# Patient Record
Sex: Male | Born: 1940 | Race: Black or African American | Hispanic: No | Smoking: Current some day smoker
Health system: Southern US, Community
[De-identification: ages and names within clinical notes are randomized; demographics above are authoritative.]

## PROBLEM LIST (undated history)

## (undated) DIAGNOSIS — M1990 Unspecified osteoarthritis, unspecified site: Secondary | ICD-10-CM

## (undated) DIAGNOSIS — I4891 Unspecified atrial fibrillation: Secondary | ICD-10-CM

## (undated) DIAGNOSIS — I429 Cardiomyopathy, unspecified: Secondary | ICD-10-CM

## (undated) DIAGNOSIS — I272 Pulmonary hypertension, unspecified: Secondary | ICD-10-CM

## (undated) DIAGNOSIS — R569 Unspecified convulsions: Secondary | ICD-10-CM

## (undated) DIAGNOSIS — F1020 Alcohol dependence, uncomplicated: Secondary | ICD-10-CM

## (undated) DIAGNOSIS — K5520 Angiodysplasia of colon without hemorrhage: Secondary | ICD-10-CM

## (undated) DIAGNOSIS — D72829 Elevated white blood cell count, unspecified: Secondary | ICD-10-CM

## (undated) DIAGNOSIS — R410 Disorientation, unspecified: Secondary | ICD-10-CM

## (undated) DIAGNOSIS — J449 Chronic obstructive pulmonary disease, unspecified: Secondary | ICD-10-CM

## (undated) DIAGNOSIS — F0390 Unspecified dementia without behavioral disturbance: Secondary | ICD-10-CM

## (undated) DIAGNOSIS — I739 Peripheral vascular disease, unspecified: Secondary | ICD-10-CM

## (undated) DIAGNOSIS — I96 Gangrene, not elsewhere classified: Secondary | ICD-10-CM

## (undated) DIAGNOSIS — F17200 Nicotine dependence, unspecified, uncomplicated: Secondary | ICD-10-CM

## (undated) DIAGNOSIS — N179 Acute kidney failure, unspecified: Secondary | ICD-10-CM

## (undated) DIAGNOSIS — I10 Essential (primary) hypertension: Secondary | ICD-10-CM

## (undated) HISTORY — DX: Angiodysplasia of colon without hemorrhage: K55.20

## (undated) HISTORY — DX: Nicotine dependence, unspecified, uncomplicated: F17.200

## (undated) HISTORY — DX: Unspecified dementia, unspecified severity, without behavioral disturbance, psychotic disturbance, mood disturbance, and anxiety: F03.90

## (undated) HISTORY — DX: Alcohol dependence, uncomplicated: F10.20

---

## 1997-11-28 ENCOUNTER — Emergency Department (HOSPITAL_COMMUNITY): Admission: EM | Admit: 1997-11-28 | Discharge: 1997-11-28 | Payer: Self-pay | Admitting: Emergency Medicine

## 1998-01-02 ENCOUNTER — Emergency Department (HOSPITAL_COMMUNITY): Admission: EM | Admit: 1998-01-02 | Discharge: 1998-01-02 | Payer: Self-pay | Admitting: Emergency Medicine

## 1998-01-02 ENCOUNTER — Encounter: Payer: Self-pay | Admitting: Emergency Medicine

## 1998-01-03 ENCOUNTER — Emergency Department (HOSPITAL_COMMUNITY): Admission: EM | Admit: 1998-01-03 | Discharge: 1998-01-03 | Payer: Self-pay | Admitting: Emergency Medicine

## 1998-09-14 ENCOUNTER — Emergency Department (HOSPITAL_COMMUNITY): Admission: EM | Admit: 1998-09-14 | Discharge: 1998-09-14 | Payer: Self-pay | Admitting: Emergency Medicine

## 1998-09-27 ENCOUNTER — Emergency Department (HOSPITAL_COMMUNITY): Admission: EM | Admit: 1998-09-27 | Discharge: 1998-09-27 | Payer: Self-pay | Admitting: Emergency Medicine

## 1998-09-27 ENCOUNTER — Encounter: Payer: Self-pay | Admitting: Emergency Medicine

## 1998-11-23 ENCOUNTER — Emergency Department (HOSPITAL_COMMUNITY): Admission: EM | Admit: 1998-11-23 | Discharge: 1998-11-24 | Payer: Self-pay | Admitting: Emergency Medicine

## 1998-11-23 ENCOUNTER — Encounter: Payer: Self-pay | Admitting: Emergency Medicine

## 1999-03-06 ENCOUNTER — Emergency Department (HOSPITAL_COMMUNITY): Admission: EM | Admit: 1999-03-06 | Discharge: 1999-03-06 | Payer: Self-pay | Admitting: Emergency Medicine

## 1999-03-15 ENCOUNTER — Emergency Department (HOSPITAL_COMMUNITY): Admission: EM | Admit: 1999-03-15 | Discharge: 1999-03-15 | Payer: Self-pay | Admitting: Emergency Medicine

## 1999-03-21 ENCOUNTER — Emergency Department (HOSPITAL_COMMUNITY): Admission: EM | Admit: 1999-03-21 | Discharge: 1999-03-21 | Payer: Self-pay | Admitting: Emergency Medicine

## 1999-06-18 ENCOUNTER — Encounter: Payer: Self-pay | Admitting: Emergency Medicine

## 1999-06-18 ENCOUNTER — Encounter: Payer: Self-pay | Admitting: *Deleted

## 1999-06-18 ENCOUNTER — Emergency Department (HOSPITAL_COMMUNITY): Admission: EM | Admit: 1999-06-18 | Discharge: 1999-06-18 | Payer: Self-pay | Admitting: Emergency Medicine

## 2000-10-06 ENCOUNTER — Encounter: Payer: Self-pay | Admitting: Emergency Medicine

## 2000-10-06 ENCOUNTER — Emergency Department (HOSPITAL_COMMUNITY): Admission: EM | Admit: 2000-10-06 | Discharge: 2000-10-06 | Payer: Self-pay | Admitting: Emergency Medicine

## 2002-01-11 ENCOUNTER — Encounter: Payer: Self-pay | Admitting: Emergency Medicine

## 2002-01-11 ENCOUNTER — Emergency Department (HOSPITAL_COMMUNITY): Admission: EM | Admit: 2002-01-11 | Discharge: 2002-01-11 | Payer: Self-pay | Admitting: Emergency Medicine

## 2003-02-11 ENCOUNTER — Observation Stay (HOSPITAL_COMMUNITY): Admission: EM | Admit: 2003-02-11 | Discharge: 2003-02-12 | Payer: Self-pay | Admitting: Emergency Medicine

## 2003-02-11 ENCOUNTER — Emergency Department (HOSPITAL_COMMUNITY): Admission: EM | Admit: 2003-02-11 | Discharge: 2003-02-11 | Payer: Self-pay | Admitting: Emergency Medicine

## 2004-02-03 ENCOUNTER — Ambulatory Visit: Payer: Self-pay | Admitting: Internal Medicine

## 2004-09-22 ENCOUNTER — Ambulatory Visit: Payer: Self-pay | Admitting: Internal Medicine

## 2004-09-26 ENCOUNTER — Ambulatory Visit (HOSPITAL_COMMUNITY): Admission: RE | Admit: 2004-09-26 | Discharge: 2004-09-26 | Payer: Self-pay | Admitting: Internal Medicine

## 2004-10-03 ENCOUNTER — Ambulatory Visit: Payer: Self-pay | Admitting: Internal Medicine

## 2005-07-07 ENCOUNTER — Emergency Department (HOSPITAL_COMMUNITY): Admission: EM | Admit: 2005-07-07 | Discharge: 2005-07-07 | Payer: Self-pay | Admitting: Emergency Medicine

## 2005-07-20 ENCOUNTER — Emergency Department (HOSPITAL_COMMUNITY): Admission: EM | Admit: 2005-07-20 | Discharge: 2005-07-20 | Payer: Self-pay | Admitting: Emergency Medicine

## 2005-07-25 ENCOUNTER — Ambulatory Visit: Payer: Self-pay | Admitting: Internal Medicine

## 2005-08-16 ENCOUNTER — Encounter: Payer: Self-pay | Admitting: Emergency Medicine

## 2006-04-11 ENCOUNTER — Ambulatory Visit: Payer: Self-pay | Admitting: *Deleted

## 2006-05-23 ENCOUNTER — Ambulatory Visit: Payer: Self-pay | Admitting: Internal Medicine

## 2007-01-02 ENCOUNTER — Encounter (INDEPENDENT_AMBULATORY_CARE_PROVIDER_SITE_OTHER): Payer: Self-pay | Admitting: *Deleted

## 2007-10-24 ENCOUNTER — Emergency Department (HOSPITAL_COMMUNITY): Admission: EM | Admit: 2007-10-24 | Discharge: 2007-10-25 | Payer: Self-pay | Admitting: Emergency Medicine

## 2008-05-18 ENCOUNTER — Encounter: Payer: Self-pay | Admitting: Internal Medicine

## 2008-06-02 ENCOUNTER — Ambulatory Visit: Payer: Self-pay | Admitting: Gastroenterology

## 2008-06-16 ENCOUNTER — Telehealth: Payer: Self-pay | Admitting: Gastroenterology

## 2008-06-16 ENCOUNTER — Ambulatory Visit: Payer: Self-pay | Admitting: Gastroenterology

## 2008-06-18 ENCOUNTER — Encounter (INDEPENDENT_AMBULATORY_CARE_PROVIDER_SITE_OTHER): Payer: Self-pay | Admitting: Gastroenterology

## 2008-06-18 ENCOUNTER — Ambulatory Visit: Payer: Self-pay | Admitting: Gastroenterology

## 2008-06-18 ENCOUNTER — Ambulatory Visit (HOSPITAL_COMMUNITY): Admission: RE | Admit: 2008-06-18 | Discharge: 2008-06-18 | Payer: Self-pay | Admitting: Gastroenterology

## 2008-06-25 ENCOUNTER — Encounter: Payer: Self-pay | Admitting: Gastroenterology

## 2009-05-06 ENCOUNTER — Encounter (INDEPENDENT_AMBULATORY_CARE_PROVIDER_SITE_OTHER): Payer: Self-pay | Admitting: *Deleted

## 2009-06-09 ENCOUNTER — Encounter (INDEPENDENT_AMBULATORY_CARE_PROVIDER_SITE_OTHER): Payer: Self-pay

## 2009-06-11 ENCOUNTER — Encounter (INDEPENDENT_AMBULATORY_CARE_PROVIDER_SITE_OTHER): Payer: Self-pay | Admitting: *Deleted

## 2009-06-30 ENCOUNTER — Encounter (INDEPENDENT_AMBULATORY_CARE_PROVIDER_SITE_OTHER): Payer: Self-pay | Admitting: *Deleted

## 2009-07-01 ENCOUNTER — Ambulatory Visit: Payer: Self-pay | Admitting: Gastroenterology

## 2009-07-05 ENCOUNTER — Telehealth: Payer: Self-pay | Admitting: Gastroenterology

## 2009-07-06 ENCOUNTER — Encounter (INDEPENDENT_AMBULATORY_CARE_PROVIDER_SITE_OTHER): Payer: Self-pay | Admitting: *Deleted

## 2009-07-06 ENCOUNTER — Telehealth: Payer: Self-pay | Admitting: Gastroenterology

## 2009-07-06 DIAGNOSIS — D126 Benign neoplasm of colon, unspecified: Secondary | ICD-10-CM

## 2009-07-14 ENCOUNTER — Ambulatory Visit (HOSPITAL_COMMUNITY): Admission: RE | Admit: 2009-07-14 | Discharge: 2009-07-14 | Payer: Self-pay | Admitting: Gastroenterology

## 2009-07-14 ENCOUNTER — Ambulatory Visit: Payer: Self-pay | Admitting: Gastroenterology

## 2010-05-17 NOTE — Progress Notes (Signed)
Summary: Colonoscopy/ERBE Scheduled   Phone Note Outgoing Call   Call placed by: Laureen Ochs LPN,  July 06, 2009 1:25 PM Call placed to: Mrs.Sherwin Summary of Call: Pt. needs Colonoscopy/ERBE, it is scheduled at [facility] on 07-14-09 at 10am. I have updated instructions with pt. wife and pt. will come to get an updated copy and I will give him a MoviPrep, he cannot afford to purchase it. Pt. instructed to call back as needed.  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

## 2010-05-17 NOTE — Letter (Signed)
Summary: Pre Visit No Show Letter  [facility]  [address] [place], [place] [address]   Phone: [phone]  Fax: [phone]        June 09, 2009 MRN: [id number]    Leon Foster [address] [place], [place]  [address]    Dear Mr. MERLO,   We have been unable to reach you by phone concerning the pre-procedure visit that you missed on 06/09/09. For this reason,your procedure scheduled on 06/18/09 has been cancelled. Our scheduling staff will gladly assist you with rescheduling your appointments at a more convenient time. Please call our office at [phone] between the hours of 8:00am and 5:00pm, press option #2 to reach an appointment scheduler. Please consider updating your contact numbers at this time so that we can reach you by phone in the future with schedule changes or results.    Thank you,    Ulis Rias RN [facility]

## 2010-05-17 NOTE — Letter (Signed)
Summary: Previsit letter  [facility]  [address] [place], [place] [address]   Phone: [phone]  Fax: [phone]       05/06/2009 MRN: [id number]  [facility] [address] [place], [place]  [address]  Dear Leon Foster,  Welcome to the Gastroenterology Division at [facility].    You are scheduled to see a nurse for your pre-procedure visit on 06-04-09 at 8:30a.m. on the 3rd floor at [facility], [address]  We ask that you try to arrive at our office 15 minutes prior to your appointment time to allow for check-in.  Your nurse visit will consist of discussing your medical and surgical history, your immediate family medical history, and your medications.    Please bring a complete list of all your medications or, if you prefer, bring the medication bottles and we will list them.  We will need to be aware of both prescribed and over the counter drugs.  We will need to know exact dosage information as well.  If you are on blood thinners (Coumadin, Plavix, Aggrenox, Ticlid, etc.) please call our office today/prior to your appointment, as we need to consult with your physician about holding your medication.   Please be prepared to read and sign documents such as consent forms, a financial agreement, and acknowledgement forms.  If necessary, and with your consent, a friend or relative is welcome to sit-in on the nurse visit with you.  Please bring your insurance card so that we may make a copy of it.  If your insurance requires a referral to see a specialist, please bring your referral form from your primary care physician.  No co-pay is required for this nurse visit.     If you cannot keep your appointment, please call [phone] to cancel or reschedule prior to your appointment date.  This allows [place] the opportunity to schedule an appointment for another patient in need of care.    Thank you for choosing [facility] for your medical  needs.  We appreciate the opportunity to care for you.  Please visit [place] at our website  to learn more about our practice.                     Sincerely.                                                                                                                   The Gastroenterology Division

## 2010-05-17 NOTE — Procedures (Signed)
Summary: Colonoscopy  Patient: Leon Foster Note: All result statuses are Final unless otherwise noted.  Tests: (1) Colonoscopy (COL)   COL Colonoscopy           DONE     [facility]     [address] [place], [place]  [address]           COLONOSCOPY PROCEDURE REPORT           PATIENT:  [name], [name]  MR#:  [id number]     BIRTHDATE:  12/11/1940, 68 yrs. old  GENDER:  male     ENDOSCOPIST:  Barbette Hair. Arlyce Dice, MD     REF. BY:     PROCEDURE DATE:  07/14/2009     PROCEDURE:  Colonoscopy with  ablation of AVMs     ASA CLASS:  Class II     INDICATIONS:  history of pre-cancerous (adenomatous) colon polyps           MEDICATIONS:   Fentanyl 50 mcg IV, Versed 10 mg IV, Benadryl 50 mg     IV           DESCRIPTION OF PROCEDURE:   After the risks benefits and     alternatives of the procedure were thoroughly explained, informed     consent was obtained.  Digital rectal exam was performed and     revealed no abnormalities.   The EC-[id number] ([id number]) endoscope was     introduced through the anus and advanced to the cecum, which was     identified by both the appendix and ileocecal valve, without     limitations.  The quality of the prep was adequate, using     MoviPrep.  The instrument was then slowly withdrawn as the colon     was fully examined.     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 Previously described AVMs, 2mm each, in     cecum - obliterated with the APC  Diverticula were found in the     ascending colon (see image006 and image001).  This was otherwise a     normal examination of the colon (see image002, image007, image008,     and image010).  Mild diverticulosis was found in the sigmoid     colon.   Retroflexed views in the rectum revealed no     abnormalities.    The scope was then withdrawn from the patient     and the procedure completed.           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           ______________________________     Barbette Hair. Arlyce Dice, MD           CC:  Kellie Shropshire, MD           n.     Rosalie Doctor:   Barbette Hair. Kemi Gell at 07/14/2009 10:29 AM           Darcey Nora, [id number]  Note: An exclamation mark (!) indicates a result that was not dispersed into the flowsheet. Document Creation Date: 07/14/2009 10:30 AM _______________________________________________________________________  (1) Order result status: Final Collection or observation date-time: 07/14/2009 10:19 Requested date-time:  Receipt date-time:  Reported date-time:  Referring Physician:   Ordering Physician: Melvia Heaps [phone]) Specimen Source:  Source: Launa Grill Order Number: [id number] Lab site:   Appended Document: Colonoscopy Recall is in IDX for 06/2012.

## 2010-05-17 NOTE — Letter (Signed)
Summary: Leon Foster Instructions  Leon Foster  [address] [place], [place] [address]   Phone: [phone]  Fax: [phone]       Leon Foster    Oct 08, 1940    MRN: [id number]        Procedure Day Dorna Bloom:  Lenor Coffin  07/15/09     Arrival Time:  8:00AM     Procedure Time:  9:00AM     Location of Procedure:                    [facility] _  [facility] (4th Floor)                        PREPARATION FOR COLONOSCOPY WITH MOVIPREP   Starting 5 days prior to your procedure 07/10/09 do not eat nuts, seeds, popcorn, corn, beans, peas,  salads, or any raw vegetables.  Do not take any fiber supplements (e.g. Metamucil, Citrucel, and Benefiber).  THE DAY BEFORE YOUR PROCEDURE         DATE: 07/14/09  DAY: WEDNESDAY  1.  Drink clear liquids the entire day-NO SOLID FOOD  2.  Do not drink anything colored red or purple.  Avoid juices with pulp.  No orange juice.  3.  Drink at least 64 oz. (8 glasses) of fluid/clear liquids during the day to prevent dehydration and help the prep work efficiently.  CLEAR LIQUIDS INCLUDE: Water Jello Ice Popsicles Tea (sugar ok, no milk/cream) Powdered fruit flavored drinks Coffee (sugar ok, no milk/cream) Gatorade Juice: apple, white grape, white cranberry  Lemonade Clear bullion, consomm, broth Carbonated beverages (any kind) Strained chicken noodle soup Hard Candy                             4.  In the morning, mix first dose of MoviPrep solution:    Empty 1 Pouch A and 1 Pouch B into the disposable container    Add lukewarm drinking water to the top line of the container. Mix to dissolve    Refrigerate (mixed solution should be used within 24 hrs)  5.  Begin drinking the prep at 5:00 p.m. The MoviPrep container is divided by 4 marks.   Every 15 minutes drink the solution down to the next mark (approximately 8 oz) until the full liter is complete.   6.  Follow completed prep with 16 oz of clear liquid of your choice  (Nothing red or purple).  Continue to drink clear liquids until bedtime.  7.  Before going to bed, mix second dose of MoviPrep solution:    Empty 1 Pouch A and 1 Pouch B into the disposable container    Add lukewarm drinking water to the top line of the container. Mix to dissolve    Refrigerate  THE DAY OF YOUR PROCEDURE      DATE: 07/15/09  DAY: THURSDAY  Beginning at 4:00a.m. (5 hours before procedure):         1. Every 15 minutes, drink the solution down to the next mark (approx 8 oz) until the full liter is complete.  2. Follow completed prep with 16 oz. of clear liquid of your choice.    3. You may drink clear liquids until 7:00AM (2 HOURS BEFORE PROCEDURE).   MEDICATION INSTRUCTIONS  Unless otherwise instructed, you should take regular prescription medications with a small sip of water   as early as possible the morning  of your procedure.        OTHER INSTRUCTIONS  You will need a responsible adult at least 69 years of age to accompany you and drive you home.   This person must remain in the waiting room during your procedure.  Wear loose fitting clothing that is easily removed.  Leave jewelry and other valuables at home.  However, you may wish to bring a book to read or  an iPod/MP3 player to listen to music as you wait for your procedure to start.  Remove all body piercing jewelry and leave at home.  Total time from sign-in until discharge is approximately 2-3 hours.  You should go home directly after your procedure and rest.  You can resume normal activities the  day after your procedure.  The day of your procedure you should not:   Drive   Make legal decisions   Operate machinery   Drink alcohol   Return to work  You will receive specific instructions about eating, activities and medications before you leave.    The above instructions have been reviewed and explained to me by  Karl Bales RN  July 01, 2009 2:36 PM    I fully  understand and can verbalize these instructions _____________________________ Date _________

## 2010-05-17 NOTE — Letter (Signed)
Summary: [facility] Instructions  [facility]  [address] [place], [place] [address]   Phone: [phone]  Fax: [phone]       Leon Foster    69/20/1942    MRN: [id number]        Procedure Day Dorna Bloom:  [facility] MARCH 30TH, 2011     Arrival Time: 9AM     Procedure Time: 10AM     Location of Procedure:                    [facility] ( Outpatient Registration)                       PREPARATION FOR COLONOSCOPY WITH MOVIPREP   Starting 5 days prior to your procedure: not eat nuts, seeds, popcorn, corn, beans, peas,  salads, or any raw vegetables.  Do not take any fiber supplements (e.g. Metamucil, Citrucel, and Benefiber).  THE DAY BEFORE YOUR PROCEDURE: TUESDAY MARCH 52WU,1324  1.  Drink clear liquids the entire day-NO SOLID FOOD  2.  Do not drink anything colored red or purple.  Avoid juices with pulp.  No orange juice.  3.  Drink at least 64 oz. (8 glasses) of fluid/clear liquids during the day to prevent dehydration and help the prep work efficiently.  CLEAR LIQUIDS INCLUDE: Water Jello Ice Popsicles Tea (sugar ok, no milk/cream) Powdered fruit flavored drinks Coffee (sugar ok, no milk/cream) Gatorade Juice: apple, white grape, white cranberry  Lemonade Clear bullion, consomm, broth Carbonated beverages (any kind) Strained chicken noodle soup Hard Candy                             4.  In the morning, mix first dose of MoviPrep solution:    Empty 1 Pouch A and 1 Pouch B into the disposable container    Add lukewarm drinking water to the top line of the container. Mix to dissolve    Refrigerate (mixed solution should be used within 24 hrs)  5.  Begin drinking the prep at 5:00 p.m. The MoviPrep container is divided by 4 marks.   Every 15 minutes drink the solution down to the next mark (approximately 8 oz) until the full liter is complete.   6.  Follow completed prep with 16 oz of clear liquid of your choice (Nothing red or  purple).  Continue to drink clear liquids until bedtime.  7.  Before going to bed, mix second dose of MoviPrep solution:    Empty 1 Pouch A and 1 Pouch B into the disposable container    Add lukewarm drinking water to the top line of the container. Mix to dissolve    Refrigerate  THE DAY OF YOUR PROCEDURE: [facility] MARCH 40NU,2725  Beginning at 5AM(5 hours before procedure):         1. Every 15 minutes, drink the solution down to the next mark (approx 8 oz) until the full liter is complete.  2. Follow completed prep with 16 oz. of clear liquid of your choice.    3. You may drink clear liquids until 8AM. 2 HOURS BEFORE YOUR PROCEDURE.   MEDICATION INSTRUCTIONS  Unless otherwise instructed, you should take regular prescription medications with a small sip of water   as early as possible the morning of your procedure.           OTHER INSTRUCTIONS  You will need a responsible adult at 69 years of age to accompany you and drive you home.   This person must remain in the waiting room during your procedure.  Wear loose fitting clothing that is easily removed.  Leave jewelry and other valuables at home.  However, you may wish to bring a book to read or  an iPod/MP3 player to listen to music as you wait for your procedure to start.  Remove all body piercing jewelry and leave at home.  Total time from sign-in until discharge is approximately 2-3 hours.  You should go home directly after your procedure and rest.  You can resume normal activities the  day after your procedure.  The day of your procedure you should not:   Drive   Make legal decisions   Operate machinery   Drink alcohol   Return to work  You will receive specific instructions about eating, activities and medications before you leave.    The above instructions have been reviewed and explained to Leon Foster by phone and Leon Foster will pick-up on 07-07-09. Laureen Ochs LPN  July 06, 2009 1:35  PM

## 2010-05-17 NOTE — Progress Notes (Signed)
Summary: Moviprep too expensive   Phone Note Call from Patient Call back at Home Phone [phone]   Caller: spouse  Kathie Rhodes Call For: Dr. Arlyce Dice Reason for Call: Talk to Nurse Summary of Call: Pt. ins. will not cover Moviprep and needs something less expensive prescribed Initial call taken by: Karna Christmas,  July 05, 2009 10:14 AM  Follow-up for Phone Call        No ID on answering machine Follow-up by: Wyona Almas RN,  July 05, 2009 10:55 AM  Additional Follow-up for Phone Call Additional follow up Details #1::        No answer Additional Follow-up by: Wyona Almas RN,  July 05, 2009 1:48 PM    Additional Follow-up for Phone Call Additional follow up Details #2::    you can use miralax 254gm in 64oz gatorade - pt should take a split dose, 1/2 the eve before and the remainder 4 hours before the procedure  Follow-up by: Louis Meckel MD,  July 05, 2009 2:08 PM  Additional Follow-up for Phone Call Additional follow up Details #3:: Details for Additional Follow-up Action Taken: Spoke with pts. wife and she said his [place] insurance dropped him and he doesn't have money for any prep, not even the Miralax.  Advised she and her husband look into a plan that has D coverage as he'll need it and it's required he have it.  Also, Pt. had his last colon at [facility] due to an incomplete one at [facility].  Do you want him to be scheduled at [facility] ?  Alvino Chapel Can be done with ERBE at [facility].  He had an incomplete exam b/c of a poor prep.  We can do a conventional prep of 2 days of clear liquids, Mg citrate days 1 and 2, fleet's enema on the eve prior to procedure and on the day of procedure Additional Follow-up by: Wyona Almas RN,  July 05, 2009 5:57 PM

## 2010-05-17 NOTE — Letter (Signed)
Summary: Previsit letter  [facility]  [address] [place], [place] [address]   Phone: [phone]  Fax: [phone]       06/11/2009 MRN: [id number]  [facility] [address] [place], [place]  [address]  Dear Mr. MCELRATH,  Welcome to the Gastroenterology Division at [facility].    You are scheduled to see a nurse for your pre-procedure visit on 07-01-09 at 2:00p.m. on the 3rd floor at [facility], [address].  We ask that you try to arrive at our office 15 minutes prior to your appointment time to allow for check-in.  Your nurse visit will consist of discussing your medical and surgical history, your immediate family medical history, and your medications.    Please bring a complete list of all your medications or, if you prefer, bring the medication bottles and we will list them.  We will need to be aware of both prescribed and over the counter drugs.  We will need to know exact dosage information as well.  If you are on blood thinners (Coumadin, Plavix, Aggrenox, Ticlid, etc.) please call our office today/prior to your appointment, as we need to consult with your physician about holding your medication.   Please be prepared to read and sign documents such as consent forms, a financial agreement, and acknowledgement forms.  If necessary, and with your consent, a friend or relative is welcome to sit-in on the nurse visit with you.  Please bring your insurance card so that we may make a copy of it.  If your insurance requires a referral to see a specialist, please bring your referral form from your primary care physician.  No co-pay is required for this nurse visit.     If you cannot keep your appointment, please call [phone] to cancel or reschedule prior to your appointment date.  This allows [place] the opportunity to schedule an appointment for another patient in need of care.    Thank you for choosing [facility] for your medical  needs.  We appreciate the opportunity to care for you.  Please visit [place] at our website  to learn more about our practice.                     Sincerely.                                                                                                                   The Gastroenterology Division

## 2010-05-17 NOTE — Miscellaneous (Signed)
Summary: [facility] previsit  Clinical Lists Changes  Medications: Added new medication of MOVIPREP 100 GM  SOLR (PEG-KCL-NACL-NASULF-NA ASC-C) As per prep instructions. - Signed Rx of MOVIPREP 100 GM  SOLR (PEG-KCL-NACL-NASULF-NA ASC-C) As per prep instructions.;  #1 x 0;  Signed;  Entered by: Karl Bales RN;  Authorized by: Louis Meckel MD;  Method used: [facility] to [facility] [address] [phone]*, [address], [place], [place], [place]  [id number], Ph: [phone] or [phone], Fax: [phone] Observations: Added new observation of NKA: T (07/01/2009 14:12)    Prescriptions: MOVIPREP 100 GM  SOLR (PEG-KCL-NACL-NASULF-NA ASC-C) As per prep instructions.  #1 x 0   Entered by:   Karl Bales RN   Authorized by:   Louis Meckel MD   Signed by:   Karl Bales RN on 07/01/2009   Method used:   [facility]  [address] [phone]* (retail)       [address]       [place], [place]  [id number]       Ph: [phone] or [phone]       Fax: [phone]   RxID:   [id number]

## 2010-09-02 NOTE — H&P (Signed)
NAME:  Leon Foster, Leon Foster NO.:  [id number]   MEDICAL RECORD NO.:  [id number]                   PATIENT TYPE:  INP   LOCATION:  1825                                 FACILITY:  [facility]   PHYSICIAN:  Hollice Espy, M.D.            DATE OF BIRTH:  1940/06/24   DATE OF ADMISSION:  02/11/2003  DATE OF DISCHARGE:                                HISTORY & PHYSICAL   ATTENDING PHYSICIAN:  Hollice Espy, M.D.   PRIMARY CARE PHYSICIAN:  I believe, is unassigned.   CHIEF COMPLAINT:  Chest pain.   This is a 70 year old African American male, with a past medical history of  COPD, suspected alcohol abuse, who presents with chest pain.  The patient  states he has been previously well with no complaints.  He started having  some sharp 10/10 right sided chest pain close to the midsternal area and  came into the ER early this morning on February 11, 2003.  He was seen by the  ER doctor who felt that it was musculoskeletal especially with the fact that  it was reproducible.  He discharged the patient home and the patient went to  the voting booth to go vote today and started having another onset of severe  chest pain 10/10, even more severe than previously forcing him to sit down.  He was brought in and at that time a CT of the chest was done to rule out PE  which was negative and showed no acute disease, only signs consistent with  COPD.  His EKG was normal sinus rhythm with some slight left atrial  abnormality.  The initial set of cardiac enzymes was negative.  Currently,  the patient states he is not having any current chest discomfort.  He is  otherwise doing well.  He denies any headaches, visual changes, and no  current shortness of breath, abdominal pain, extremity pain, weakness,  hematuria, dysuria, constipation, diarrhea.  He describes his chest pain as  sharp 10/10, located in the mid sternal to slightly right of that area.  No  other radiation.   Positive shortness of breath, positive diaphoresis.  The  patient could not quantify the length of time.   PAST MEDICAL HISTORY:  1. COPD.  2. He also has suspected alcohol abuse.  3. He has a history of an appendix removal.   MEDICATIONS:  None.   ALLERGIES:  None.   SOCIAL HISTORY:  The patient is a greater than 25 pack year smoker.  He  states that he does not drink beer every day, but when he does he usually  drinks a few.  He denies any drug use.   FAMILY HISTORY:  Noncontributory.   PHYSICAL EXAMINATION:  VITAL SIGNS:  On admission, temp 98.1, pulse 94,  blood pressure 89/61 currently 100/60, respirations 14, O2 sat 100% on room  air.  GENERAL:  This is a 70 year old African  American male who looks slightly  younger than his stated age.  He appears to be in no apparent distress,  alert and oriented x 3.  HEENT:  Normocephalic, atraumatic.  He has slightly icteric sclera.  No  carotid bruits.  Mucous membranes are moist.  CARDIOVASCULAR:  Regular, rate and rhythm, S1, S2.  LUNGS:  Clear to auscultation bilaterally.  ABDOMEN:  Soft, nontender, nondistended.  Positive bowel sounds.  EXTREMITIES:  No clubbing, cyanosis or edema.   LAB WORK:  He has a sodium 143, potassium 4.5, chloride 110, bicarb 28, BUN  7, creatinine 0.9, glucose 89.  LFTs were essentially within normal limits,  though his albumin is slightly low at 3.3.  He has a white count of 7.1 with  a 59% shift which is normal.  H&H of 12.7 and 37.9, MCV of 93.  Platelet  count is 293.  He has a serum alcohol level, from earlier this morning, of  120.  His last set of cardiac markers showed a CPK of 84.7, MB of 1.4,  troponin I of less than 0.05.  The previous set, done at 9 a.m. this  morning, were also negative.  UA is negative as well.   ASSESSMENT/PLAN:  16. A 70 year old [place] American male with a history of chronic obstructive     pulmonary disease, suspected alcohol abuse, presents with chest pain.  He      does not appear to have medical followup, and I suspect that he might be     at high risk for coronary artery disease.  We will admit him to rule out     an myocardial infarction, check two more sets of enzymes, stress test     tomorrow.  2. In regards to his chronic obstructive pulmonary disease, we will continue     him on albuterol nebs.  3. In regard to his alcohol abuse, we will put him on thiamine, folate,     check an alcohol level in the morning.  At this time, I do not feel he is     at risk for withdrawal, although we will watch him carefully as he is on     a telemetry bed.                                                Hollice Espy, M.D.    SKK/MEDQ  D:  02/11/2003  T:  02/11/2003  Job:  [id number]

## 2011-01-12 LAB — RAPID URINE DRUG SCREEN, HOSP PERFORMED
Amphetamines: NOT DETECTED
Barbiturates: NOT DETECTED
Benzodiazepines: NOT DETECTED
Cocaine: NOT DETECTED
Opiates: POSITIVE — AB
Tetrahydrocannabinol: NOT DETECTED

## 2011-01-12 LAB — POCT I-STAT, CHEM 8
BUN: 6
Calcium, Ion: 1.06 — ABNORMAL LOW
Chloride: 103
Creatinine, Ser: 1.1
Glucose, Bld: 102 — ABNORMAL HIGH
HCT: 49
Hemoglobin: 16.7
Potassium: 3.6
Sodium: 141
TCO2: 25

## 2011-01-12 LAB — ETHANOL: Alcohol, Ethyl (B): 318 — ABNORMAL HIGH

## 2012-05-15 ENCOUNTER — Encounter: Payer: Self-pay | Admitting: Gastroenterology

## 2013-02-28 ENCOUNTER — Encounter: Payer: Self-pay | Admitting: Gastroenterology

## 2013-03-29 ENCOUNTER — Emergency Department (HOSPITAL_COMMUNITY): Payer: No Typology Code available for payment source

## 2013-03-29 ENCOUNTER — Encounter (HOSPITAL_COMMUNITY): Payer: Self-pay | Admitting: Emergency Medicine

## 2013-03-29 ENCOUNTER — Emergency Department (HOSPITAL_COMMUNITY)
Admission: EM | Admit: 2013-03-29 | Discharge: 2013-03-30 | Disposition: A | Discharge status: Home / Self Care | Payer: No Typology Code available for payment source | Attending: Emergency Medicine | Admitting: Emergency Medicine

## 2013-03-29 DIAGNOSIS — Y9389 Activity, other specified: Secondary | ICD-10-CM | POA: Insufficient documentation

## 2013-03-29 DIAGNOSIS — IMO0002 Reserved for concepts with insufficient information to code with codable children: Secondary | ICD-10-CM | POA: Insufficient documentation

## 2013-03-29 DIAGNOSIS — S76911A Strain of unspecified muscles, fascia and tendons at thigh level, right thigh, initial encounter: Secondary | ICD-10-CM

## 2013-03-29 DIAGNOSIS — Y92410 Unspecified street and highway as the place of occurrence of the external cause: Secondary | ICD-10-CM | POA: Insufficient documentation

## 2013-03-29 DIAGNOSIS — F17200 Nicotine dependence, unspecified, uncomplicated: Secondary | ICD-10-CM | POA: Insufficient documentation

## 2013-03-29 MED ORDER — OXYCODONE-ACETAMINOPHEN 5-325 MG PO TABS
1.0000 | ORAL_TABLET | Freq: Once | ORAL | Status: AC
  Administered 2013-03-30: 1 via ORAL
  Filled 2013-03-29: qty 1
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
## 2013-03-29 NOTE — ED Notes (Signed)
Bed: WA02 Expected date:  Expected time:  Means of arrival:  Comments: EMS-abdominal pain 
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
## 2013-03-30 MED ORDER — OXYCODONE-ACETAMINOPHEN 5-325 MG PO TABS: 1.0000 | ORAL_TABLET | Freq: Four times a day (QID) | ORAL | Status: DC | PRN

## 2013-08-02 ENCOUNTER — Emergency Department (HOSPITAL_COMMUNITY): Payer: Medicare Other

## 2013-08-02 ENCOUNTER — Emergency Department (HOSPITAL_COMMUNITY)
Admission: EM | Admit: 2013-08-02 | Discharge: 2013-08-02 | Disposition: A | Discharge status: Home / Self Care | Payer: Medicare Other | Attending: Emergency Medicine | Admitting: Emergency Medicine

## 2013-08-02 ENCOUNTER — Encounter (HOSPITAL_COMMUNITY): Payer: Self-pay | Admitting: Emergency Medicine

## 2013-08-02 DIAGNOSIS — M79609 Pain in unspecified limb: Secondary | ICD-10-CM | POA: Insufficient documentation

## 2013-08-02 DIAGNOSIS — F17200 Nicotine dependence, unspecified, uncomplicated: Secondary | ICD-10-CM | POA: Insufficient documentation

## 2013-08-02 DIAGNOSIS — M25519 Pain in unspecified shoulder: Secondary | ICD-10-CM | POA: Insufficient documentation

## 2013-08-02 DIAGNOSIS — R109 Unspecified abdominal pain: Secondary | ICD-10-CM | POA: Insufficient documentation

## 2013-08-02 DIAGNOSIS — M25511 Pain in right shoulder: Secondary | ICD-10-CM

## 2013-08-02 LAB — CBC WITH DIFFERENTIAL/PLATELET
Basophils Absolute: 0.1 10*3/uL (ref 0.0–0.1)
Basophils Relative: 0 % (ref 0–1)
EOS ABS: 0.1 10*3/uL (ref 0.0–0.7)
Eosinophils Relative: 1 % (ref 0–5)
HCT: 40.8 % (ref 39.0–52.0)
HEMOGLOBIN: 14.2 g/dL (ref 13.0–17.0)
LYMPHS ABS: 1.5 10*3/uL (ref 0.7–4.0)
LYMPHS PCT: 13 % (ref 12–46)
MCH: 30.9 pg (ref 26.0–34.0)
MCHC: 34.8 g/dL (ref 30.0–36.0)
MCV: 88.9 fL (ref 78.0–100.0)
MONOS PCT: 13 % — AB (ref 3–12)
Monocytes Absolute: 1.5 10*3/uL — ABNORMAL HIGH (ref 0.1–1.0)
NEUTROS PCT: 73 % (ref 43–77)
Neutro Abs: 8.4 10*3/uL — ABNORMAL HIGH (ref 1.7–7.7)
Platelets: 261 10*3/uL (ref 150–400)
RBC: 4.59 MIL/uL (ref 4.22–5.81)
RDW: 16.1 % — ABNORMAL HIGH (ref 11.5–15.5)
WBC: 11.6 10*3/uL — AB (ref 4.0–10.5)

## 2013-08-02 LAB — I-STAT CHEM 8, ED
BUN: 6 mg/dL (ref 6–23)
CREATININE: 0.7 mg/dL (ref 0.50–1.35)
Calcium, Ion: 1.1 mmol/L — ABNORMAL LOW (ref 1.13–1.30)
Chloride: 98 mEq/L (ref 96–112)
Glucose, Bld: 110 mg/dL — ABNORMAL HIGH (ref 70–99)
HCT: 46 % (ref 39.0–52.0)
Hemoglobin: 15.6 g/dL (ref 13.0–17.0)
Potassium: 3.4 mEq/L — ABNORMAL LOW (ref 3.7–5.3)
SODIUM: 137 meq/L (ref 137–147)
TCO2: 27 mmol/L (ref 0–100)

## 2013-08-02 MED ORDER — MORPHINE SULFATE 4 MG/ML IJ SOLN
4.0000 mg | Freq: Once | INTRAMUSCULAR | Status: AC
  Administered 2013-08-02: 4 mg via INTRAVENOUS
  Filled 2013-08-02: qty 1

## 2013-08-02 MED ORDER — POTASSIUM CHLORIDE CRYS ER 20 MEQ PO TBCR
40.0000 meq | EXTENDED_RELEASE_TABLET | Freq: Once | ORAL | Status: AC
  Administered 2013-08-02: 40 meq via ORAL
  Filled 2013-08-02: qty 2

## 2013-08-02 NOTE — ED Notes (Signed)
Pt c/o right arm and left left pain that started 3 days ago that would resolve throughout the day but became worse last night. BP-152/82 HR-72 irregular. Monitor showed PVCs and PJCs per EMS. 20g Left forearm.

## 2013-08-02 NOTE — ED Notes (Signed)
Patient denies recent injuries

## 2013-08-02 NOTE — ED Notes (Signed)
Dr. Gwenyth Bouillon at bedside, patient c/o right shoulder pain and left leg pain

## 2013-08-02 NOTE — ED Provider Notes (Addendum)
CSN: [id number]     Arrival date & time 08/02/13  0736 History   First MD Initiated Contact with Patient 08/02/13 [phone]     Chief Complaint  Patient presents with  . Arm Pain  . Leg Pain     (Consider location/radiation/quality/duration/timing/severity/associated sxs/prior Treatment) Patient is a 73 y.o. male presenting with arm pain and leg pain.  Arm Pain  Leg Pain  Complains of right shoulder pain onset 3 days ago gradually pain is worse with movement improved with remaining still also complains of left groin pain for the past 2-3 days, gradual onset worse with movement improved with remaining still no chest pain no abdominal pain no shortness of breath no fever no trauma no other complaint. Brought by EMS . Treated with aspirin, without relief.. No other associated symptoms. History reviewed. No pertinent past medical history. past medical history COPD, suspected alcohol abuse History reviewed. No pertinent past surgical history. past surgical history appendectomy No family history on file. History  Substance Use Topics  . Smoking status: Current Every Day Smoker  . Smokeless tobacco: Not on file  . Alcohol Use: Yes     Comment: occasionally    occasional alcohol Review of Systems  Constitutional: Negative.   HENT: Negative.   Respiratory: Negative.   Cardiovascular: Negative.   Gastrointestinal: Negative.   Musculoskeletal: Positive for arthralgias.  Skin: Negative.   Neurological: Negative.   Psychiatric/Behavioral: Negative.   All other systems reviewed and are negative.     Allergies  Review of patient's allergies indicates no known allergies.  Home Medications   Prior to Admission medications   Medication Sig Start Date End Date Taking? Authorizing Provider  oxyCODONE-acetaminophen (PERCOCET/ROXICET) 5-325 MG per tablet Take 1 tablet by mouth every 6 (six) hours as needed. 03/30/13   Blanchie Dessert, MD   BP 137/73  Pulse 71  Temp(Src) 98.4 F (36.9 C)  (Oral)  Resp 16  SpO2 99% Physical Exam  Nursing note and vitals reviewed. Constitutional: He appears distressed.  Mildly uncomfortable alert Glasgow Coma Score 15  HENT:  Head: Normocephalic and atraumatic.  Eyes: Conjunctivae are normal. Pupils are equal, round, and reactive to light.  Neck: Neck supple. No tracheal deviation present. No thyromegaly present.  Cardiovascular: Normal rate and regular rhythm.   No murmur heard. Pulmonary/Chest: Effort normal and breath sounds normal.  Abdominal: Soft. Bowel sounds are normal. He exhibits no distension. There is no tenderness.  Genitourinary: Penis normal.  Testes norma. No herina  Musculoskeletal: Normal range of motion. He exhibits no edema and no tenderness.  Right upper extremity without redness or swelling or point tenderness  . Limited range of shoulder due to pain. Radial pulse 2+ bilaterally. Left lower extremity without redness swelling or tenderness. No mass no hernia. DP pulse 2+ bilaterally. All other chemistries are redness swelling or tenderness neurovascular intact  Neurological: He is alert. Coordination normal.  Skin: Skin is warm and dry. No rash noted.  Psychiatric: He has a normal mood and affect.    ED Course  Procedures (including critical care time) Labs Review Labs Reviewed - No data to display  Imaging Review No results found.   EKG Interpretation None     10:20 AM Pain is improved after treatment with intravenous morphine. Patient alert Glasgow Coma Score 15 ambulatory difficulty X-rays reviewed by me Results for orders placed during the hospital encounter of 08/02/13  CBC WITH DIFFERENTIAL      Result Value Ref Range   WBC 11.6 (*)  4.0 - 10.5 K/uL   RBC 4.59  4.22 - 5.81 MIL/uL   Hemoglobin 14.2  13.0 - 17.0 g/dL   HCT 40.8  39.0 - 52.0 %   MCV 88.9  78.0 - 100.0 fL   MCH 30.9  26.0 - 34.0 pg   MCHC 34.8  30.0 - 36.0 g/dL   RDW 16.1 (*) 11.5 - 15.5 %   Platelets 261  150 - 400 K/uL    Neutrophils Relative % 73  43 - 77 %   Neutro Abs 8.4 (*) 1.7 - 7.7 K/uL   Lymphocytes Relative 13  12 - 46 %   Lymphs Abs 1.5  0.7 - 4.0 K/uL   Monocytes Relative 13 (*) 3 - 12 %   Monocytes Absolute 1.5 (*) 0.1 - 1.0 K/uL   Eosinophils Relative 1  0 - 5 %   Eosinophils Absolute 0.1  0.0 - 0.7 K/uL   Basophils Relative 0  0 - 1 %   Basophils Absolute 0.1  0.0 - 0.1 K/uL  I-STAT CHEM 8, ED      Result Value Ref Range   Sodium 137  137 - 147 mEq/L   Potassium 3.4 (*) 3.7 - 5.3 mEq/L   Chloride 98  96 - 112 mEq/L   BUN 6  6 - 23 mg/dL   Creatinine, Ser 0.70  0.50 - 1.35 mg/dL   Glucose, Bld 110 (*) 70 - 99 mg/dL   Calcium, Ion 1.10 (*) 1.13 - 1.30 mmol/L   TCO2 27  0 - 100 mmol/L   Hemoglobin 15.6  13.0 - 17.0 g/dL   HCT 46.0  39.0 - 52.0 %   Dg Shoulder Right  08/02/2013   CLINICAL DATA:  Right shoulder pain.  Limited mobility.  EXAM: RIGHT SHOULDER - 2+ VIEW  COMPARISON:  No priors.  FINDINGS: Degenerative changes of osteoarthritis at the glenohumeral joint. No acute displaced fracture, subluxation, dislocation, joint or soft tissue abnormality.  IMPRESSION: 1. No acute radiographic abnormality of the right shoulder. 2. Glenohumeral joint osteoarthritis.   Electronically Signed   By: Vinnie Langton M.D.   On: 08/02/2013 09:15   Dg Hip Complete Left  08/02/2013   CLINICAL DATA:  History of left hip and leg pain.  EXAM: LEFT HIP - COMPLETE 2+ VIEW  COMPARISON:  No priors.  FINDINGS: AP view of the pelvis and AP and lateral views of the left hip demonstrate no acute displaced fracture of the bony pelvic ring or of the visualized portions of the proximal femurs bilaterally. The left femoral head is properly located. Mild degenerative changes of osteoarthritis are noted in the hip joints bilaterally.  IMPRESSION: 1. No acute radiographic abnormality of the bony pelvis or either hip. 2. Mild bilateral hip joint osteoarthritis.   Electronically Signed   By: Vinnie Langton M.D.   On:  08/02/2013 09:17    MDM  Patient has frozen right shoulder, with pain on attempted motion. In light of fact that patient drinks 1 quart of beer per day. We'll not prescribe opioid. He can take Tylenol as directed, 650 mg 4 times daily. He is referred back to his primary care physician Dr. Karlton Lemon. He may require physical therapy or outpatient orthopedic follow up Final diagnoses:  None   Diagnosis#1 arthralgias #2 hypokalemia     Orlie Dakin, MD 08/02/13 1031  Orlie Dakin, MD 08/02/13 1032

## 2013-08-02 NOTE — Discharge Instructions (Signed)
Arthralgia Take Tylenol 650 mg every 4 hours as needed for pain. Avoid alcohol, beer or wine. Call Dr. Karlton Lemon in 2 days to schedule an office appointment. You may need to have physical therapy for your shoulder or to see an orthopedic specialist as an outpatient Arthralgia is joint pain. A joint is a place where two bones meet. Joint pain can happen for many reasons. The joint can be bruised, stiff, infected, or weak from aging. Pain usually goes away after resting and taking medicine for soreness.  HOME CARE  Rest the joint as told by your doctor.  Keep the sore joint raised (elevated) for the first 24 hours.  Put ice on the joint area.  Put ice in a plastic bag.  Place a towel between your skin and the bag.  Leave the ice on for 15-20 minutes, 03-04 times a day.  Wear your splint, casting, elastic bandage, or sling as told by your doctor.  Only take medicine as told by your doctor. Do not take aspirin.  Use crutches as told by your doctor. Do not put weight on the joint until told to by your doctor. GET HELP RIGHT AWAY IF:   You have bruising, puffiness (swelling), or more pain.  Your fingers or toes turn blue or start to lose feeling (numb).  Your medicine does not lessen the pain.  Your pain becomes severe.  You have a temperature by mouth above 102 F (38.9 C), not controlled by medicine.  You cannot move or use the joint. MAKE SURE YOU:   Understand these instructions.  Will watch your condition.  Will get help right away if you are not doing well or get worse. Document Released: 03/22/2009 Document Revised: 06/26/2011 Document Reviewed: 03/22/2009 [facility] Patient Information 2014 [place], [place].

## 2014-03-14 ENCOUNTER — Emergency Department (HOSPITAL_COMMUNITY)
Admission: EM | Admit: 2014-03-14 | Discharge: 2014-03-14 | Discharge status: Against Medical Advice | Payer: Medicare Other | Attending: Emergency Medicine | Admitting: Emergency Medicine

## 2014-03-14 ENCOUNTER — Ambulatory Visit (INDEPENDENT_AMBULATORY_CARE_PROVIDER_SITE_OTHER): Payer: Medicare Other | Admitting: Family Medicine

## 2014-03-14 ENCOUNTER — Encounter (HOSPITAL_COMMUNITY): Payer: Self-pay | Admitting: *Deleted

## 2014-03-14 VITALS — BP 100/68 | HR 82 | Temp 98.3°F | Resp 16 | Ht 70.0 in | Wt 134.4 lb

## 2014-03-14 DIAGNOSIS — Y998 Other external cause status: Secondary | ICD-10-CM | POA: Insufficient documentation

## 2014-03-14 DIAGNOSIS — Y9389 Activity, other specified: Secondary | ICD-10-CM | POA: Diagnosis not present

## 2014-03-14 DIAGNOSIS — Z720 Tobacco use: Secondary | ICD-10-CM | POA: Diagnosis not present

## 2014-03-14 DIAGNOSIS — Y92009 Unspecified place in unspecified non-institutional (private) residence as the place of occurrence of the external cause: Secondary | ICD-10-CM | POA: Insufficient documentation

## 2014-03-14 DIAGNOSIS — Z23 Encounter for immunization: Secondary | ICD-10-CM

## 2014-03-14 DIAGNOSIS — S0180XA Unspecified open wound of other part of head, initial encounter: Secondary | ICD-10-CM

## 2014-03-14 DIAGNOSIS — W01198A Fall on same level from slipping, tripping and stumbling with subsequent striking against other object, initial encounter: Secondary | ICD-10-CM | POA: Insufficient documentation

## 2014-03-14 DIAGNOSIS — S0181XA Laceration without foreign body of other part of head, initial encounter: Secondary | ICD-10-CM | POA: Diagnosis not present

## 2014-03-14 NOTE — Progress Notes (Signed)
Verbal consent obtained from patient and his sister.  Local anesthesia with 5cc Lidocaine 2% with epinephrine.  Wound scrubbed with soap and water and rinsed.  Wound closed with #5 4-0 Prolene simple interrupted sutures.  Wound cleansed and dressed.

## 2014-03-14 NOTE — Patient Instructions (Signed)

## 2014-03-14 NOTE — ED Notes (Signed)
Pt in after fall at home today, pt tripped and hit his chin, laceration noted, ETOH today, denies LOC

## 2014-03-14 NOTE — Progress Notes (Signed)
Subjective: 73 year old male who tripped on the floor at home and fell forward splitting his chin open. He is so scared he is not talking or answering any questions, but his sister does. He has not had any recent tetanus shots that they know of. He is not known to be allergic to anything. He had been drinking beer before this fall. No loss of consciousness. Other injury.  Objective: Straight laceration across his chin, just under the edge of it. It is a clean-looking split through his beard. He is fairly deep. 3.0 cm's  Assessment: Wound chin  Plan: Laceration repair TDAP  Return in 5-7 days to have sutures removed. Sooner if problems. Spoke with his sister about watching him closely.

## 2014-08-20 ENCOUNTER — Emergency Department (HOSPITAL_COMMUNITY)
Admission: EM | Admit: 2014-08-20 | Discharge: 2014-08-21 | Disposition: A | Discharge status: Home / Self Care | Payer: Medicare Other | Attending: Emergency Medicine | Admitting: Emergency Medicine

## 2014-08-20 ENCOUNTER — Encounter (HOSPITAL_COMMUNITY): Payer: Self-pay | Admitting: Emergency Medicine

## 2014-08-20 ENCOUNTER — Emergency Department (HOSPITAL_COMMUNITY): Payer: Medicare Other

## 2014-08-20 DIAGNOSIS — Z720 Tobacco use: Secondary | ICD-10-CM | POA: Insufficient documentation

## 2014-08-20 DIAGNOSIS — R4020 Unspecified coma: Secondary | ICD-10-CM

## 2014-08-20 DIAGNOSIS — R55 Syncope and collapse: Secondary | ICD-10-CM | POA: Diagnosis not present

## 2014-08-20 DIAGNOSIS — E876 Hypokalemia: Secondary | ICD-10-CM | POA: Insufficient documentation

## 2014-08-20 DIAGNOSIS — F1010 Alcohol abuse, uncomplicated: Secondary | ICD-10-CM | POA: Diagnosis not present

## 2014-08-20 LAB — COMPREHENSIVE METABOLIC PANEL
ALBUMIN: 2.6 g/dL — AB (ref 3.5–5.0)
ALT: 10 U/L — ABNORMAL LOW (ref 17–63)
AST: 32 U/L (ref 15–41)
Alkaline Phosphatase: 56 U/L (ref 38–126)
Anion gap: 15 (ref 5–15)
BILIRUBIN TOTAL: 1.8 mg/dL — AB (ref 0.3–1.2)
CALCIUM: 7.5 mg/dL — AB (ref 8.9–10.3)
CO2: 30 mmol/L (ref 22–32)
CREATININE: 1.06 mg/dL (ref 0.61–1.24)
Chloride: 96 mmol/L — ABNORMAL LOW (ref 101–111)
GFR calc Af Amer: >60 mL/min (ref >60)
GFR calc non Af Amer: >60 mL/min (ref >60)
Glucose, Bld: 96 mg/dL (ref 70–99)
Potassium: 2.3 mmol/L — CL (ref 3.5–5.1)
Sodium: 141 mmol/L (ref 135–145)
Total Protein: 5.9 g/dL — ABNORMAL LOW (ref 6.5–8.1)

## 2014-08-20 LAB — CBC WITH DIFFERENTIAL/PLATELET
BASOS ABS: 0 10*3/uL (ref 0.0–0.1)
Basophils Relative: 1 % (ref 0–1)
EOS PCT: 1 % (ref 0–5)
Eosinophils Absolute: 0.1 10*3/uL (ref 0.0–0.7)
HCT: 31.7 % — ABNORMAL LOW (ref 39.0–52.0)
Hemoglobin: 10.9 g/dL — ABNORMAL LOW (ref 13.0–17.0)
LYMPHS ABS: 1.3 10*3/uL (ref 0.7–4.0)
LYMPHS PCT: 28 % (ref 12–46)
MCH: 32.3 pg (ref 26.0–34.0)
MCHC: 34.4 g/dL (ref 30.0–36.0)
MCV: 94.1 fL (ref 78.0–100.0)
Monocytes Absolute: 0.4 10*3/uL (ref 0.1–1.0)
Monocytes Relative: 8 % (ref 3–12)
Neutro Abs: 3 10*3/uL (ref 1.7–7.7)
Neutrophils Relative %: 62 % (ref 43–77)
PLATELETS: 146 10*3/uL — AB (ref 150–400)
RBC: 3.37 MIL/uL — AB (ref 4.22–5.81)
RDW: 17.4 % — AB (ref 11.5–15.5)
WBC: 4.8 10*3/uL (ref 4.0–10.5)

## 2014-08-20 MED ORDER — SODIUM CHLORIDE 0.9 % IV BOLUS (SEPSIS)
1000.0000 mL | Freq: Once | INTRAVENOUS | Status: AC
  Administered 2014-08-20: 1000 mL via INTRAVENOUS

## 2014-08-20 MED ORDER — POTASSIUM CHLORIDE CRYS ER 20 MEQ PO TBCR
40.0000 meq | EXTENDED_RELEASE_TABLET | Freq: Once | ORAL | Status: AC
  Administered 2014-08-20: 40 meq via ORAL
  Filled 2014-08-20: qty 2

## 2014-08-20 MED ORDER — POTASSIUM CHLORIDE 10 MEQ/100ML IV SOLN
10.0000 meq | INTRAVENOUS | Status: AC
  Administered 2014-08-20 (×2): 10 meq via INTRAVENOUS
  Filled 2014-08-20 (×2): qty 100

## 2014-08-20 NOTE — ED Provider Notes (Signed)
CSN: [id number]     Arrival date & time 08/20/14  1830 History   First MD Initiated Contact with Patient 08/20/14 1944     Chief Complaint  Patient presents with  . Loss of Consciousness     (Consider location/radiation/quality/duration/timing/severity/associated sxs/prior Treatment) Patient is a 74 y.o. male presenting with syncope. The history is provided by the patient.  Loss of Consciousness Episode history:  Single Most recent episode:  Today Duration:  10 seconds Timing: once. Progression:  Resolved Chronicity:  New Context comment:  EtOh use today Witnessed: yes   Relieved by:  None tried Worsened by:  Nothing tried Ineffective treatments:  None tried Associated symptoms: no chest pain, no confusion, no diaphoresis, no difficulty breathing, no dizziness, no fever, no focal sensory loss, no focal weakness, no headaches, no nausea, no palpitations, no recent injury, no seizures, no shortness of breath, no visual change, no vomiting and no weakness     History reviewed. No pertinent past medical history. History reviewed. No pertinent past surgical history. Family History  Problem Relation Age of Onset  . Stroke Sister   . Cancer Brother    History  Substance Use Topics  . Smoking status: Heavy Tobacco Smoker  . Smokeless tobacco: Not on file  . Alcohol Use: 1.2 oz/week    0 Standard drinks or equivalent, 2 Cans of beer per week     Comment: occasionally    Review of Systems  Constitutional: Negative for fever, chills, diaphoresis, appetite change and fatigue.  Eyes: Negative for photophobia and visual disturbance.  Respiratory: Negative for cough, chest tightness, shortness of breath and wheezing.   Cardiovascular: Positive for syncope. Negative for chest pain, palpitations and leg swelling.  Gastrointestinal: Negative for nausea, vomiting, abdominal pain, diarrhea, constipation, blood in stool and abdominal distention.  Genitourinary: Negative for dysuria and  difficulty urinating.  Musculoskeletal: Negative for back pain, neck pain and neck stiffness.  Skin: Negative for color change, pallor, rash and wound.  Neurological: Positive for syncope. Negative for dizziness, focal weakness, seizures, weakness, light-headedness, numbness and headaches.  Psychiatric/Behavioral: Negative for confusion.  All other systems reviewed and are negative.     Allergies  Review of patient's allergies indicates no known allergies.  Home Medications   Prior to Admission medications   Medication Sig Start Date End Date Taking? Authorizing Provider  potassium chloride SA (K-DUR,KLOR-CON) 20 MEQ tablet Take 1 tablet (20 mEq total) by mouth 2 (two) times daily. 08/21/14 08/28/14  Ellwood Dense, MD   BP 131/87 mmHg  Pulse 101  Temp(Src) 98.2 F (36.8 C) (Oral)  Resp 17  SpO2 97% Physical Exam  Constitutional: He is oriented to person, place, and time. He appears well-developed and well-nourished. No distress.  HENT:  Head: Normocephalic and atraumatic.  Mouth/Throat: Oropharynx is clear and moist.  Eyes: Conjunctivae and EOM are normal. Pupils are equal, round, and reactive to light.  Neck: Normal range of motion. Neck supple.  Cardiovascular: Normal rate, regular rhythm, normal heart sounds and intact distal pulses.  Exam reveals no gallop and no friction rub.   No murmur heard. Pulmonary/Chest: Effort normal and breath sounds normal. No respiratory distress. He has no wheezes. He has no rales.  Abdominal: Soft. Bowel sounds are normal. He exhibits no distension. There is no tenderness. There is no rebound and no guarding.  Musculoskeletal: Normal range of motion. He exhibits no edema or tenderness.  Neurological: He is alert and oriented to person, place, and time. He has normal strength.  He displays no tremor. No cranial nerve deficit or sensory deficit. He exhibits normal muscle tone. He displays no seizure activity. Coordination normal. GCS eye subscore is 4.  GCS verbal subscore is 5. GCS motor subscore is 6.  Skin: Skin is warm and dry. No rash noted. He is not diaphoretic. No erythema. No pallor.    ED Course  Procedures (including critical care time) Labs Review Labs Reviewed  CBC WITH DIFFERENTIAL/PLATELET - Abnormal; Notable for the following:    RBC 3.37 (*)    Hemoglobin 10.9 (*)    HCT 31.7 (*)    RDW 17.4 (*)    Platelets 146 (*)    All other components within normal limits  COMPREHENSIVE METABOLIC PANEL - Abnormal; Notable for the following:    Potassium 2.3 (*)    Chloride 96 (*)    BUN <5 (*)    Calcium 7.5 (*)    Total Protein 5.9 (*)    Albumin 2.6 (*)    ALT 10 (*)    Total Bilirubin 1.8 (*)    All other components within normal limits    Imaging Review Ct Head Wo Contrast  08/20/2014   CLINICAL DATA:  Loss of consciousness, head injury  EXAM: CT HEAD WITHOUT CONTRAST  CT CERVICAL SPINE WITHOUT CONTRAST  TECHNIQUE: Multidetector CT imaging of the head and cervical spine was performed following the standard protocol without intravenous contrast. Multiplanar CT image reconstructions of the cervical spine were also generated.  COMPARISON:  10/25/2007  FINDINGS: CT HEAD FINDINGS  No skull fracture is noted. Paranasal sinuses and mastoid air cells are unremarkable.  No intracranial hemorrhage, mass effect or midline shift. Mild cerebral atrophy. No mass lesion is noted on this unenhanced scan. No acute cortical infarction.  CT CERVICAL SPINE FINDINGS  Axial images of the cervical spine shows no acute fracture or subluxation. Computer processed images shows no acute fracture or subluxation. There is mild anterior spurring lower endplate of C2 vertebral body. Minimal disc space flattening at C3-C4 level. Moderate anterior spurring upper endplate of C5 vertebral body. Mild disc space flattening with mild anterior and mild posterior spurring at C5-C6 level. There is disc space flattening with vacuum disc phenomenon and mild anterior  spurring at C6-C7 level. No prevertebral soft tissue swelling. Cervical airway is patent.  There is no pneumothorax in visualized lung apices. 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  IMPRESSION: 1. No acute intracranial abnormality.  Mild cerebral atrophy. 2. No cervical spine acute fracture or subluxation. Degenerative changes as described above. 3. Emphysematous changes bilateral lung apices.   Electronically Signed   By: Lahoma Crocker M.D.   On: 08/20/2014 20:53   Ct Cervical Spine Wo Contrast  08/20/2014   CLINICAL DATA:  Loss of consciousness, head injury  EXAM: CT HEAD WITHOUT CONTRAST  CT CERVICAL SPINE WITHOUT CONTRAST  TECHNIQUE: Multidetector CT imaging of the head and cervical spine was performed following the standard protocol without intravenous contrast. Multiplanar CT image reconstructions of the cervical spine were also generated.  COMPARISON:  10/25/2007  FINDINGS: CT HEAD FINDINGS  No skull fracture is noted. Paranasal sinuses and mastoid air cells are unremarkable.  No intracranial hemorrhage, mass effect or midline shift. Mild cerebral atrophy. No mass lesion is noted on this unenhanced scan. No acute cortical infarction.  CT CERVICAL SPINE FINDINGS  Axial images of the cervical spine shows no acute fracture or subluxation. Computer processed images shows no acute fracture or subluxation. There is mild anterior spurring  lower endplate of C2 vertebral body. Minimal disc space flattening at C3-C4 level. Moderate anterior spurring upper endplate of C5 vertebral body. Mild disc space flattening with mild anterior and mild posterior spurring at C5-C6 level. There is disc space flattening with vacuum disc phenomenon and mild anterior spurring at C6-C7 level. No prevertebral soft tissue swelling. Cervical airway is patent.  There is no pneumothorax in visualized lung apices. 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  IMPRESSION: 1. No acute intracranial abnormality.   Mild cerebral atrophy. 2. No cervical spine acute fracture or subluxation. Degenerative changes as described above. 3. Emphysematous changes bilateral lung apices.   Electronically Signed   By: Lahoma Crocker M.D.   On: 08/20/2014 20:53     EKG Interpretation   Date/Time:  Thursday Aug 20 2014 20:56:14 EDT Ventricular Rate:  82 PR Interval:  103 QRS Duration: 101 QT Interval:  478 QTC Calculation: 558 R Axis:   32 Text Interpretation:  Sinus rhythm Multiform ventricular premature  complexes Short PR interval Anteroseptal infarct, old Repol abnrm suggests  ischemia, diffuse leads Prolonged QT interval Baseline wander in lead(s)  V3 When compared with ECG of 02/11/2003, QT has lengthened Confirmed by  [facility]  MD, DAVID ([id number]) on 08/20/2014 9:01:10 PM      MDM   Final diagnoses:  LOC (loss of consciousness)  Alcohol abuse  Hypokalemia    74 yo M presenting s/p syncopal episode.  Pt was at [facility] store when he had witnessed syncopal event.  Event brief, no reported seizure activity.  Pt reports feeling dizzy prior to event.  Denies preceding HA, vision changes, chest pain, dyspnea, N/V.  States he recalls most of event.  Remembers hitting back of head.  Does not complain of any pain at this time.  Endorses drinking 2 beers today, states he normally drinks about a 6-pack per day.  Pt noted to be orthostatic in triage.  On presentation, pt alert, VSS, in NAD.  No external signs of head trauma.  No c-spine TTP.  Neuro exam with no focal deficits. CV and lung exam WNL.  Abdomen soft, non-tender.  Exam otherwise WNL.  Possible dehydration/alcohol intoxication.  EKG as above, non-specific ST abnormalities, occasional PVCs, baseline wander.  Will scan head given EtOh abuse with head injury.  Check basic lab to r/o electrolyte dysfunction or anemia.  Fluids given.  Labs significant for hypokalemia to 2.3- repleted in ED.  Imaging negative for acute injury.  Will prescribe K+ and advise close  f/u with PCP.  ED return precautions given.  No other concerns.  Discussed with attending Dr. Roxanne Mins.    Ellwood Dense, MD 58/09/98 [id number]  Delora Fuel, MD 50/53/97 [id number]

## 2014-08-20 NOTE — ED Notes (Signed)
Did not stand pt up to do orthostatic due to blood pressure dropping from lying to sitting. Pt denied any dizziness.

## 2014-08-20 NOTE — ED Notes (Signed)
Back from radiology.

## 2014-08-20 NOTE — ED Notes (Signed)
Patient was at [employer]  And had a witness syncopal epode. ETOH on board per EMS. Patient head lose LOC and Hit his head. Patient Orthostatic were postive with EMS.  Alter & O x4 on arrival.

## 2014-08-21 MED ORDER — POTASSIUM CHLORIDE CRYS ER 20 MEQ PO TBCR: 20.0000 meq | EXTENDED_RELEASE_TABLET | Freq: Two times a day (BID) | ORAL | Status: DC

## 2014-08-21 NOTE — Discharge Instructions (Signed)
Alcohol and Nutrition Nutrition serves two purposes. It provides energy. It also maintains body structure and function. Food supplies energy. It also provides the building blocks needed to replace worn or damaged cells. Alcoholics often eat poorly. This limits their supply of essential nutrients. This affects energy supply and structure maintenance. Alcohol also affects the body's nutrients in:  Digestion.  Storage.  Using and getting rid of waste products. IMPAIRMENT OF NUTRIENT DIGESTION AND UTILIZATION   Once ingested, food must be broken down into small components (digested). Then it is available for energy. It helps maintain body structure and function. Digestion begins in the mouth. It continues in the stomach and intestines, with help from the pancreas. The nutrients from digested food are absorbed from the intestines into the blood. Then they are carried to the liver. The liver prepares nutrients for:  Immediate use.  Storage and future use.  Alcohol inhibits the breakdown of nutrients into usable molecules.  It decreases secretion of digestive enzymes from the pancreas.  Alcohol impairs nutrient absorption by damaging the cells lining the stomach and intestines.  It also interferes with moving some nutrients into the blood.  In addition, nutritional deficiencies themselves may lead to further absorption problems.  For example, folate deficiency changes the cells that line the small intestine. This impairs how water is absorbed. It also affects absorbed nutrients. These include glucose, sodium, and additional folate.  Even if nutrients are digested and absorbed, alcohol can prevent them from being fully used. It changes their transport, storage, and excretion. Impaired utilization of nutrients by alcoholics is indicated by:  Decreased liver stores of vitamins, such as vitamin A.  Increased excretion of nutrients such as fat. ALCOHOL AND ENERGY SUPPLY   Three basic  nutritional components found in food are:  Carbohydrates.  Proteins.  Fats.  These are used as energy. Some alcoholics take in as much as 50% of their total daily calories from alcohol. They often neglect important foods.  Even when enough food is eaten, alcohol can impair the ways the body controls blood sugar (glucose) levels. It may either increase or decrease blood sugar.  In non-diabetic alcoholics, increased blood sugar (hyperglycemia) is caused by poor insulin secretion. It is usually temporary.  Decreased blood sugar (hypoglycemia) can cause serious injury even if this condition is short-lived. Low blood sugar can happen when a fasting or malnourished person drinks alcohol. When there is no food to supply energy, stored sugar is used up. The products of alcohol inhibit forming glucose from other compounds such as amino acids. As a result, alcohol causes the brain and other body tissue to lack glucose. It is needed for energy and function.  Alcohol is an energy source. But how the body processes and uses the energy from alcohol is complex. Also, when alcohol is substituted for carbohydrates, subjects tend to lose weight. This indicates that they get less energy from alcohol than from food. ALCOHOL - MAINTAINING CELL STRUCTURE AND FUNCTION  Structure Cells are made mostly of protein. So an adequate protein diet is important for maintaining cell structure. This is especially true if cells are being damaged. Research indicates that alcohol affects protein nutrition by causing impaired:  Digestion of proteins to amino acids.  Processing of amino acids by the small intestine and liver.  Synthesis of proteins from amino acids.  Protein secretion by the liver. Function Nutrients are essential for the body to function well. They provide the tools that the body needs to work well:  Proteins.  Vitamins.  Minerals. Alcohol can disrupt body function. It may cause nutrient  deficiencies. And it may interfere with the way nutrients are processed. Vitamins  Vitamins are essential to maintain growth and normal metabolism. They regulate many of the body`s processes. Chronic heavy drinking causes deficiencies in many vitamins. This is caused by eating less. And, in some cases, vitamins may be poorly absorbed. For example, alcohol inhibits fat absorption. It impairs how the vitamins A, E, and D are normally absorbed along with dietary fats. Not enough vitamin A may cause night blindness. Not enough vitamin D may cause softening of the bones.  Some alcoholics lack vitamins A, C, D, E, K, and the B vitamins. These are all involved in wound healing and cell maintenance. In particular, because vitamin K is necessary for blood clotting, lacking that vitamin can cause delayed clotting. The result is excess bleeding. Lacking other vitamins involved in brain function may cause severe neurological damage. Minerals Deficiencies of minerals such as calcium, magnesium, iron, and zinc are common in alcoholics. The alcohol itself does not seem to affect how these minerals are absorbed. Rather, they seem to occur secondary to other alcohol-related problems, such as:  Less calcium absorbed.  Not enough magnesium.  More urinary excretion.  Vomiting.  Diarrhea.  Not enough iron due to gastrointestinal bleeding.  Not enough zinc or losses related to other nutrient deficiencies.  Mineral deficiencies can cause a variety of medical consequences. These range from calcium-related bone disease to zinc-related night blindness and skin lesions. ALCOHOL, MALNUTRITION, AND MEDICAL COMPLICATIONS  Liver Disease   Alcoholic liver damage is caused primarily by alcohol itself. But poor nutrition may increase the risk of alcohol-related liver damage. For example, nutrients normally found in the liver are known to be affected by drinking alcohol. These include carotenoids, which are the major  sources of vitamin A, and vitamin E compounds. Decreases in such nutrients may play some role in alcohol-related liver damage. Pancreatitis  Research suggests that malnutrition may increase the risk of developing alcoholic pancreatitis. Research suggests that a diet lacking in protein may increase alcohol's damaging effect on the pancreas. Brain  Nutritional deficiencies may have severe effects on brain function. These may be permanent. Specifically, thiamine deficiencies are often seen in alcoholics. They can cause severe neurological problems. These include:  Impaired movement.  Memory loss seen in Wernicke-Korsakoff syndrome. Pregnancy  Alcohol has toxic effects on fetal development. It causes alcohol-related birth defects. They include fetal alcohol syndrome. Alcohol itself is toxic to the fetus. Also, the nutritional deficiency can affect how the fetus develops. That may compound the risk of developmental damage.  Nutritional needs during pregnancy are 10% to 30% greater than normal. Food intake can increase by as much as 140% to cover the needs of both mother and fetus. An alcoholic mother`s nutritional problems may adversely affect the nutrition of the fetus. And alcohol itself can also restrict nutrition flow to the fetus. NUTRITIONAL STATUS OF ALCOHOLICS  Techniques for assessing nutritional status include:  Taking body measurements to estimate fat reserves. They include:  Weight.  Height.  Mass.  Skin fold thickness.  Performing blood analysis to provide measurements of circulating:  Proteins.  Vitamins.  Minerals.  These techniques tend to be imprecise. For many nutrients, there is no clear "cut-off" point that would allow an accurate definition of deficiency. So assessing the nutritional status of alcoholics is limited by these techniques. Dietary status may provide information about the risk of developing nutritional problems.  Dietary status is assessed by:  Taking  patients' dietary histories.  Evaluating the amount and types of food they are eating.  It is difficult to determine what exact amount of alcohol begins to have damaging effects on nutrition. In general, moderate drinkers have 2 drinks or less per day. They seem to be at little risk for nutritional problems. Various medical disorders begin to appear at greater levels.  Research indicates that the majority of even the heaviest drinkers have few obvious nutritional deficiencies. Many alcoholics who are hospitalized for medical complications of their disease do have severe malnutrition. Alcoholics tend to eat poorly. Often they eat less than the amounts of food necessary to provide enough:  Carbohydrates.  Protein.  Fat.  Vitamins A and C.  B vitamins.  Minerals like calcium and iron. Of major concern is alcohol's effect on digesting food and use of nutrients. It may shift a mildly malnourished person toward severe malnutrition. Document Released: 01/26/2005 Document Revised: 06/26/2011 Document Reviewed: 07/12/2005 [facility] Patient Information 2015 [place], [place]. This information is not intended to replace advice given to you by your health care provider. Make sure you discuss any questions you have with your health care provider.

## 2014-09-17 LAB — PULMONARY FUNCTION TEST

## 2014-10-07 ENCOUNTER — Encounter: Payer: Self-pay | Admitting: Gastroenterology

## 2014-11-16 DIAGNOSIS — N179 Acute kidney failure, unspecified: Secondary | ICD-10-CM

## 2014-11-16 HISTORY — DX: Acute kidney failure, unspecified: N17.9

## 2014-11-26 ENCOUNTER — Ambulatory Visit (INDEPENDENT_AMBULATORY_CARE_PROVIDER_SITE_OTHER): Payer: Medicare Other

## 2014-11-26 ENCOUNTER — Other Ambulatory Visit: Payer: Self-pay

## 2014-11-26 ENCOUNTER — Ambulatory Visit (INDEPENDENT_AMBULATORY_CARE_PROVIDER_SITE_OTHER): Payer: Medicare Other | Admitting: Family Medicine

## 2014-11-26 ENCOUNTER — Ambulatory Visit (INDEPENDENT_AMBULATORY_CARE_PROVIDER_SITE_OTHER): Payer: Medicare Other | Admitting: Vascular Surgery

## 2014-11-26 ENCOUNTER — Encounter: Payer: Self-pay | Admitting: Vascular Surgery

## 2014-11-26 ENCOUNTER — Ambulatory Visit (INDEPENDENT_AMBULATORY_CARE_PROVIDER_SITE_OTHER)
Admission: RE | Admit: 2014-11-26 | Discharge: 2014-11-26 | Disposition: A | Discharge status: Home / Self Care | Payer: Medicare Other | Source: Ambulatory Visit | Attending: Family Medicine | Admitting: Family Medicine

## 2014-11-26 VITALS — BP 104/79 | HR 122 | Temp 97.7°F | Resp 18 | Ht 70.0 in | Wt 120.0 lb

## 2014-11-26 VITALS — BP 92/56 | HR 104 | Temp 97.4°F | Resp 14 | Ht 70.0 in | Wt 120.0 lb

## 2014-11-26 DIAGNOSIS — I70262 Atherosclerosis of native arteries of extremities with gangrene, left leg: Secondary | ICD-10-CM | POA: Diagnosis not present

## 2014-11-26 DIAGNOSIS — R251 Tremor, unspecified: Secondary | ICD-10-CM | POA: Diagnosis not present

## 2014-11-26 DIAGNOSIS — D649 Anemia, unspecified: Secondary | ICD-10-CM

## 2014-11-26 DIAGNOSIS — I96 Gangrene, not elsewhere classified: Secondary | ICD-10-CM

## 2014-11-26 DIAGNOSIS — Z720 Tobacco use: Secondary | ICD-10-CM

## 2014-11-26 DIAGNOSIS — M79675 Pain in left toe(s): Secondary | ICD-10-CM

## 2014-11-26 DIAGNOSIS — I998 Other disorder of circulatory system: Secondary | ICD-10-CM

## 2014-11-26 DIAGNOSIS — M79605 Pain in left leg: Secondary | ICD-10-CM

## 2014-11-26 DIAGNOSIS — D72829 Elevated white blood cell count, unspecified: Secondary | ICD-10-CM

## 2014-11-26 DIAGNOSIS — F1010 Alcohol abuse, uncomplicated: Secondary | ICD-10-CM | POA: Diagnosis not present

## 2014-11-26 DIAGNOSIS — Z8709 Personal history of other diseases of the respiratory system: Secondary | ICD-10-CM

## 2014-11-26 DIAGNOSIS — Z87898 Personal history of other specified conditions: Secondary | ICD-10-CM

## 2014-11-26 DIAGNOSIS — F17200 Nicotine dependence, unspecified, uncomplicated: Secondary | ICD-10-CM

## 2014-11-26 LAB — POCT CBC
Granulocyte percent: 84.7 % — AB (ref 37–80)
HCT, POC: 34.7 % — AB (ref 43.5–53.7)
Hemoglobin: 10.9 g/dL — AB (ref 14.1–18.1)
Lymph, poc: 1.3 (ref 0.6–3.4)
MCH, POC: 30.3 pg (ref 27–31.2)
MCHC: 31.5 g/dL — AB (ref 31.8–35.4)
MCV: 96.1 fL (ref 80–97)
MID (cbc): 0.5 (ref 0–0.9)
MPV: 6.4 fL (ref 0–99.8)
POC Granulocyte: 9.9 — AB (ref 2–6.9)
POC LYMPH PERCENT: 11.4 %L (ref 10–50)
POC MID %: 3.9 % (ref 0–12)
Platelet Count, POC: 390 10*3/uL (ref 142–424)
RBC: 3.61 M/uL — AB (ref 4.69–6.13)
RDW, POC: 20.3 %
WBC: 11.7 10*3/uL — AB (ref 4.6–10.2)

## 2014-11-26 LAB — GLUCOSE, POCT (MANUAL RESULT ENTRY): POC GLUCOSE: 126 mg/dL — AB (ref 70–99)

## 2014-11-26 NOTE — Progress Notes (Signed)
Chief Complaint:  Chief Complaint  Patient presents with  . Toes black    Onset 1 week  . Foot Pain    HPI: Leon Foster is a 74 y.o. male who reports to [facility] today complaining of left 2nd toe blackness and coldness and pain for 1 week, just noticed it today. He has no diabetes per sister. He is an alcoholic. Last drink was yesterday. Minimal tremors. He takes in 1.5 pints of wine daily. He has pain in the toe, he still has sensation but mostly to pain. No n/t. He is on abx for UTI. No fevers chills n/v/abd pain or rashes. NKI. Per sister no renal issues and no circulation issues. However patient is a smoker.   Patient was seen by PCP and given neb treatment per sister for chronic wheezing with COPD, also abx for UTI she does nto knwo what it is. But we checked and it is cipro 500 mg BID x 15 days He has lost weight over time. She is not here for that but wants to address it and is here for his toe.   Past Medical History  Diagnosis Date  . Tobacco dependence   . Alcoholic   . AVM (arteriovenous malformation) of colon     colonoscopy 2011 [facility]    No past surgical history on file. Social History   Social History  . Marital Status: Married    Spouse Name: N/A  . Number of Children: N/A  . Years of Education: N/A   Social History Main Topics  . Smoking status: Current Every Day Smoker  . Smokeless tobacco: Never Used  . Alcohol Use: 1.2 oz/week    2 Cans of beer, 0 Standard drinks or equivalent per week     Comment: occasionally  . Drug Use: No  . Sexual Activity: Not Asked   Other Topics Concern  . None   Social History Narrative   Family History  Problem Relation Age of Onset  . Stroke Sister   . Deep vein thrombosis Sister   . Cancer Brother   . Diabetes Brother   . Hyperlipidemia Brother   . Hypertension Brother   . Heart disease Father   . Hypertension Sister    No Known Allergies Prior to Admission medications   Medication Sig Start Date  End Date Taking? Authorizing Provider  potassium chloride (KLOR-CON) 20 MEQ packet Take by mouth once.   Yes Historical Provider, MD  potassium chloride SA (K-DUR,KLOR-CON) 20 MEQ tablet Take 1 tablet (20 mEq total) by mouth 2 (two) times daily. 08/21/14 08/28/14  Ellwood Dense, MD     ROS: The patient denies fevers, chills, night sweats, chest pain, palpitations, wheezing, dyspnea on exertion, nausea, vomiting, abdominal pain, dysuria, hematuria, melena  All other systems have been reviewed and were otherwise negative with the exception of those mentioned in the HPI and as above.    PHYSICAL EXAM: Filed Vitals:   11/26/14 1049  BP: 92/56  Pulse: 104  Temp: 97.4 F (36.3 C)  Resp: 14   Body mass index is 17.22 kg/(m^2).   General: Alert, no acute distress,  He is oriented but not very conversive, thin  HEENT:  Normocephalic, atraumatic, oropharynx patent. EOMI, PERRLA Cardiovascular:  Regular rate and rhythm, no rubs murmurs or gallops.  No Carotid bruits, radial pulse intact. No pedal edema.  Respiratory:  No rales,  + couarse rhonchi.  No cyanosis, no use of accessory musculature Abdominal: No organomegaly,  abdomen is soft and non-tender, positive bowel sounds. No masses. Skin: No rashes. Neurologic: Facial musculature symmetric. Psychiatric: Patient acts appropriately throughout our interaction. Lymphatic: No cervical or submandibular lymphadenopathy Musculoskeletal: Gait antalgic. + ischemic 2nd left toe Unablet to palpate DP , cool to the touch, black coloration of 2nd toe Please see picture in media   LABS: Results for orders placed or performed in visit on 11/26/14  COMPLETE METABOLIC PANEL WITH GFR  Result Value Ref Range   Sodium 138 135 - 146 mmol/L   Potassium 3.5 3.5 - 5.3 mmol/L   Chloride 100 98 - 110 mmol/L   CO2 25 20 - 31 mmol/L   Glucose, Bld 115 (H) 65 - 99 mg/dL   BUN 20 7 - 25 mg/dL   Creat 5.27 (H) 0.70 - 1.18 mg/dL   Total Bilirubin 0.3 0.2 - 1.2 mg/dL    Alkaline Phosphatase 67 40 - 115 U/L   AST 9 (L) 10 - 35 U/L   ALT 3 (L) 9 - 46 U/L   Total Protein 6.1 6.1 - 8.1 g/dL   Albumin 2.5 (L) 3.6 - 5.1 g/dL   Calcium 7.8 (L) 8.6 - 10.3 mg/dL   GFR, Est African American 12 (L) >=60 mL/min   GFR, Est Non African American 10 (L) >=60 mL/min  POCT glucose (manual entry)  Result Value Ref Range   POC Glucose 126 (A) 70 - 99 mg/dl  POCT CBC  Result Value Ref Range   WBC 11.7 (A) 4.6 - 10.2 K/uL   Lymph, poc 1.3 0.6 - 3.4   POC LYMPH PERCENT 11.4 10 - 50 %L   MID (cbc) 0.5 0 - 0.9   POC MID % 3.9 0 - 12 %M   POC Granulocyte 9.9 (A) 2 - 6.9   Granulocyte percent 84.7 (A) 37 - 80 %G   RBC 3.61 (A) 4.69 - 6.13 M/uL   Hemoglobin 10.9 (A) 14.1 - 18.1 g/dL   HCT, POC 34.7 (A) 43.5 - 53.7 %   MCV 96.1 80 - 97 fL   MCH, POC 30.3 27 - 31.2 pg   MCHC 31.5 (A) 31.8 - 35.4 g/dL   RDW, POC 20.3 %   Platelet Count, POC 390 142 - 424 K/uL   MPV 6.4 0 - 99.8 fL     EKG/XRAY:   Primary read interpreted by Dr. Marin Comment at [facility]. Neg for fracture or discoloration   ASSESSMENT/PLAN: Encounter Diagnoses  Name Primary?  . Episode of shaking   . Ischemia of toe Yes  . Leukocytosis   . Tobacco use disorder   . Alcohol abuse   . H/O wheezing   . Anemia, unspecified anemia type    I have asked the patient's sister to bring him back tomorrow to get a urine test and also Chest xray and more full workup since she stes she is only here for the toe but also mentioned weight loss On chart review he has had a colonscopy in 2011 with [place] and has colon polyps and AVM of ascending colon He may have leukocystosis from his toe or UTI but on exam had some rhonchi Labs are pending, he has an appt with Dr Oneida Alar in 1 hour and needs to get there since this was a worked in appt that Dr Oneida Alar was to open up for [place].  Fu in the AM.   Gross sideeffects, risk and benefits, and alternatives of medications d/w patient. Patient is aware that all medications have potential  sideeffects and we are unable to predict every sideeffect or drug-drug interaction that may occur.  Thao Le DO  11/27/2014 8:58 AM

## 2014-11-26 NOTE — Progress Notes (Signed)
[facility] [place] HISTORY AND PHYSICAL   History of Present Illness:  Patient is a 74 y.o. year old male who presents as an add-on for evaluation of ischemic changes of left 2nd toes for two weeks. The patient is unable to provide a clear history. The history is supported by his sister and brother. He reports constant pain to his toe. He denies any previous issues with his legs or toes. The patient previously lived with his sister but has been living alone for the past two months. He smokes one pack per day. He reports drinking two bottles of wine daily. His sister denies any episodes of DTs or seizures, but reports episodes of "shaking." She also reports episodes of urinary incontinence while drinking.   Other medical problems include has POLYP, COLON, RECURRENT and Episode of shaking on his problem list. He has unintentionally lost 14 lbs since May. His sister has set up a colonoscopy for him as a screening.   He is not diabetic. He is currently undergoing treatment of a UTI (with cipro).   Social History Social History  Substance Use Topics  . Smoking status: Current Every Day Smoker  . Smokeless tobacco: Never Used  . Alcohol Use: 1.2 oz/week    2 Cans of beer, 0 Standard drinks or equivalent per week     Comment: occasionally    Family History Family History  Problem Relation Age of Onset  . Stroke Sister   . Deep vein thrombosis Sister   . Cancer Brother   . Diabetes Brother   . Hyperlipidemia Brother   . Hypertension Brother   . Heart disease Father   . Hypertension Sister     Allergies  No Known Allergies   Current Outpatient Prescriptions  Medication Sig Dispense Refill  . ciprofloxacin (CIPRO) 500 MG tablet 2 (two) times daily.  0  . folic acid (FOLVITE) 1 MG tablet TK 1 T PO QD  11  . potassium chloride (KLOR-CON) 20 MEQ packet Take by mouth once.    . potassium chloride (K-DUR) 10 MEQ tablet TK 1 T D  2  . potassium chloride SA  (K-DUR,KLOR-CON) 20 MEQ tablet Take 1 tablet (20 mEq total) by mouth 2 (two) times daily. 14 tablet 0   No current facility-administered medications for this visit.    ROS:   General:  +weight loss, no fever, chills  HEENT: No recent headaches, no nasal bleeding, no visual changes, no sore throat  Neurologic: No dizziness, blackouts, seizures. No recent symptoms of stroke or mini- stroke. No recent episodes of slurred speech, or temporary blindness.  Cardiac: No recent episodes of chest pain/pressure, no shortness of breath at rest.  No shortness of breath with exertion.  Denies history of atrial fibrillation or irregular heartbeat  Vascular: No history of rest pain in feet.  No history of claudication.  No history of non-healing ulcer, No history of DVT   Pulmonary: No home oxygen, no productive cough, no hemoptysis,  No asthma or wheezing  Musculoskeletal:  [ ]  Arthritis, [ ]  Low back pain,  [ ]  Joint pain  Hematologic:No history of hypercoagulable state.  No history of easy bleeding.  No history of anemia  Gastrointestinal: No hematochezia or melena,  No gastroesophageal reflux, no trouble swallowing  Urinary: [ ]  chronic Kidney disease, [ ]  on HD - [ ]  MWF or [ ]  TTHS, [ ]  Burning with urination, [ ]  Frequent urination, [ ]  Difficulty urinating;   Skin: No  rashes  Psychological: No history of anxiety,  No history of depression   Physical Examination  Filed Vitals:   11/26/14 1506  BP: 104/79  Pulse: 122  Temp: 97.7 F (36.5 C)  TempSrc: Oral  Resp: 18  Height: 5\' 10"  (1.778 m)  Weight: 120 lb (54.432 kg)  SpO2: 100%    Body mass index is 17.22 kg/(m^2).  General:  Thin male,  unable to respond clearly to questions during interview, in NAD HEENT: Normal Neck: No bruit or JVD Pulmonary: coarse breath sounds bilaterally Cardiac: Distant heart sounds, Regular Rate and Rhythm without murmur Abdomen: Soft, non-tender, non-distended, no mass, no scars Skin: No  rash Extremity Pulses:  2+ radial, 2+ femoral, non palpable dorsalis pedis, posterior tibial pulses bilaterally, dry gangrene of left second toe. No cellulitis, no drainage, no odor.  Musculoskeletal: No deformity or edema  Neurologic: Upper and lower extremity motor 5/5 and symmetric  DATA:  ABIs (11/26/2014) reveal right ABI of 0.88 and undeterminable left ABI due to absence of left doppler signal.   ASSESSMENT:  Dry gangrene of left second toe. ETOH abuse.   PLAN:  Aortogram with bilateral runoff and possible intervention with Dr. Oneida Alar on Monday 11/30/14. Discussed that if he has extensive disease not amenable to percutaneous intervention, may require bypass or possible amputation    Virgina Jock, PA-C [facility] [place]  This patient was seen and examined in conjunction with Dr. Oneida Alar.    History and exam findings as above. Patient has a threatened limb left leg. He overall is fairly debilitated and has his smoking and alcohol abuse have deconditioned him significantly. Patient is unable to provide much history and most of this was provided by the family. Apparently he is living independently and does walk. We will schedule him for an arteriogram on Monday to wait possible options. A percutaneous option is available we will proceed on Monday. If not we will have to rethink whether or not he is a candidate for operation. Risks benefits possible, occasions and procedure details arteriogram and intervention were explained to the patient and his family today. Understand and agree to proceed.  Ruta Hinds, MD [facility] [place] Office: [phone] Pager: [phone]

## 2014-11-27 ENCOUNTER — Emergency Department (HOSPITAL_COMMUNITY): Payer: Medicare Other

## 2014-11-27 ENCOUNTER — Encounter (HOSPITAL_COMMUNITY): Payer: Self-pay | Admitting: Emergency Medicine

## 2014-11-27 ENCOUNTER — Inpatient Hospital Stay (HOSPITAL_COMMUNITY): Payer: Medicare Other

## 2014-11-27 ENCOUNTER — Telehealth: Payer: Self-pay | Admitting: Family Medicine

## 2014-11-27 ENCOUNTER — Inpatient Hospital Stay (HOSPITAL_COMMUNITY)
Admission: EM | Admit: 2014-11-27 | Discharge: 2014-12-15 | DRG: 252 | Disposition: A | Discharge status: Skilled Nursing Facility | Payer: Medicare Other | Attending: Internal Medicine | Admitting: Internal Medicine

## 2014-11-27 ENCOUNTER — Encounter: Payer: Self-pay | Admitting: Family Medicine

## 2014-11-27 DIAGNOSIS — I272 Other secondary pulmonary hypertension: Secondary | ICD-10-CM | POA: Diagnosis present

## 2014-11-27 DIAGNOSIS — I43 Cardiomyopathy in diseases classified elsewhere: Secondary | ICD-10-CM

## 2014-11-27 DIAGNOSIS — R410 Disorientation, unspecified: Secondary | ICD-10-CM | POA: Diagnosis present

## 2014-11-27 DIAGNOSIS — I10 Essential (primary) hypertension: Secondary | ICD-10-CM | POA: Diagnosis present

## 2014-11-27 DIAGNOSIS — E876 Hypokalemia: Secondary | ICD-10-CM | POA: Diagnosis not present

## 2014-11-27 DIAGNOSIS — M79675 Pain in left toe(s): Secondary | ICD-10-CM

## 2014-11-27 DIAGNOSIS — E870 Hyperosmolality and hypernatremia: Secondary | ICD-10-CM | POA: Diagnosis present

## 2014-11-27 DIAGNOSIS — I96 Gangrene, not elsewhere classified: Secondary | ICD-10-CM | POA: Diagnosis present

## 2014-11-27 DIAGNOSIS — Z720 Tobacco use: Secondary | ICD-10-CM | POA: Diagnosis present

## 2014-11-27 DIAGNOSIS — J449 Chronic obstructive pulmonary disease, unspecified: Secondary | ICD-10-CM | POA: Diagnosis present

## 2014-11-27 DIAGNOSIS — E860 Dehydration: Secondary | ICD-10-CM | POA: Diagnosis present

## 2014-11-27 DIAGNOSIS — N390 Urinary tract infection, site not specified: Secondary | ICD-10-CM | POA: Diagnosis present

## 2014-11-27 DIAGNOSIS — F10231 Alcohol dependence with withdrawal delirium: Secondary | ICD-10-CM

## 2014-11-27 DIAGNOSIS — N170 Acute kidney failure with tubular necrosis: Secondary | ICD-10-CM | POA: Diagnosis present

## 2014-11-27 DIAGNOSIS — F0390 Unspecified dementia without behavioral disturbance: Secondary | ICD-10-CM | POA: Diagnosis present

## 2014-11-27 DIAGNOSIS — I7092 Chronic total occlusion of artery of the extremities: Secondary | ICD-10-CM | POA: Diagnosis present

## 2014-11-27 DIAGNOSIS — E872 Acidosis: Secondary | ICD-10-CM | POA: Diagnosis present

## 2014-11-27 DIAGNOSIS — F10239 Alcohol dependence with withdrawal, unspecified: Secondary | ICD-10-CM | POA: Diagnosis present

## 2014-11-27 DIAGNOSIS — F1010 Alcohol abuse, uncomplicated: Secondary | ICD-10-CM | POA: Diagnosis present

## 2014-11-27 DIAGNOSIS — M7989 Other specified soft tissue disorders: Secondary | ICD-10-CM

## 2014-11-27 DIAGNOSIS — I70292 Other atherosclerosis of native arteries of extremities, left leg: Secondary | ICD-10-CM | POA: Diagnosis not present

## 2014-11-27 DIAGNOSIS — D72829 Elevated white blood cell count, unspecified: Secondary | ICD-10-CM | POA: Diagnosis present

## 2014-11-27 DIAGNOSIS — I739 Peripheral vascular disease, unspecified: Secondary | ICD-10-CM | POA: Diagnosis present

## 2014-11-27 DIAGNOSIS — I998 Other disorder of circulatory system: Secondary | ICD-10-CM | POA: Diagnosis not present

## 2014-11-27 DIAGNOSIS — I429 Cardiomyopathy, unspecified: Secondary | ICD-10-CM | POA: Diagnosis not present

## 2014-11-27 DIAGNOSIS — I70262 Atherosclerosis of native arteries of extremities with gangrene, left leg: Secondary | ICD-10-CM | POA: Diagnosis present

## 2014-11-27 DIAGNOSIS — F17210 Nicotine dependence, cigarettes, uncomplicated: Secondary | ICD-10-CM | POA: Diagnosis present

## 2014-11-27 DIAGNOSIS — A419 Sepsis, unspecified organism: Secondary | ICD-10-CM | POA: Diagnosis not present

## 2014-11-27 DIAGNOSIS — D649 Anemia, unspecified: Secondary | ICD-10-CM | POA: Diagnosis present

## 2014-11-27 DIAGNOSIS — N179 Acute kidney failure, unspecified: Secondary | ICD-10-CM | POA: Diagnosis present

## 2014-11-27 DIAGNOSIS — IMO0001 Reserved for inherently not codable concepts without codable children: Secondary | ICD-10-CM | POA: Diagnosis present

## 2014-11-27 DIAGNOSIS — Z01810 Encounter for preprocedural cardiovascular examination: Secondary | ICD-10-CM | POA: Diagnosis not present

## 2014-11-27 DIAGNOSIS — F1020 Alcohol dependence, uncomplicated: Secondary | ICD-10-CM | POA: Diagnosis present

## 2014-11-27 DIAGNOSIS — I119 Hypertensive heart disease without heart failure: Secondary | ICD-10-CM | POA: Diagnosis present

## 2014-11-27 LAB — URINE MICROSCOPIC-ADD ON

## 2014-11-27 LAB — COMPREHENSIVE METABOLIC PANEL
ALT: 5 U/L — ABNORMAL LOW (ref 17–63)
AST: 17 U/L (ref 15–41)
Albumin: 2.4 g/dL — ABNORMAL LOW (ref 3.5–5.0)
Alkaline Phosphatase: 69 U/L (ref 38–126)
Anion gap: 15 (ref 5–15)
BUN: 21 mg/dL — ABNORMAL HIGH (ref 6–20)
CALCIUM: 7.9 mg/dL — AB (ref 8.9–10.3)
CO2: 22 mmol/L (ref 22–32)
CREATININE: 7.2 mg/dL — AB (ref 0.61–1.24)
Chloride: 101 mmol/L (ref 101–111)
GFR calc Af Amer: 8 mL/min — ABNORMAL LOW (ref >60)
GFR, EST NON AFRICAN AMERICAN: 7 mL/min — AB (ref >60)
GLUCOSE: 110 mg/dL — AB (ref 65–99)
POTASSIUM: 3 mmol/L — AB (ref 3.5–5.1)
Sodium: 138 mmol/L (ref 135–145)
Total Bilirubin: 0.6 mg/dL (ref 0.3–1.2)
Total Protein: 6.5 g/dL (ref 6.5–8.1)

## 2014-11-27 LAB — URINALYSIS, ROUTINE W REFLEX MICROSCOPIC
BILIRUBIN URINE: NEGATIVE
Glucose, UA: NEGATIVE mg/dL
KETONES UR: NEGATIVE mg/dL
Leukocytes, UA: NEGATIVE
NITRITE: NEGATIVE
PROTEIN: 30 mg/dL — AB
SPECIFIC GRAVITY, URINE: 1.009 (ref 1.005–1.030)
UROBILINOGEN UA: 0.2 mg/dL (ref 0.0–1.0)
pH: 5.5 (ref 5.0–8.0)

## 2014-11-27 LAB — CBC WITH DIFFERENTIAL/PLATELET
BASOS PCT: 0 % (ref 0–1)
Basophils Absolute: 0 10*3/uL (ref 0.0–0.1)
EOS PCT: 1 % (ref 0–5)
Eosinophils Absolute: 0.2 10*3/uL (ref 0.0–0.7)
HEMATOCRIT: 34.5 % — AB (ref 39.0–52.0)
HEMOGLOBIN: 11.6 g/dL — AB (ref 13.0–17.0)
LYMPHS ABS: 1.3 10*3/uL (ref 0.7–4.0)
Lymphocytes Relative: 11 % — ABNORMAL LOW (ref 12–46)
MCH: 32.3 pg (ref 26.0–34.0)
MCHC: 33.6 g/dL (ref 30.0–36.0)
MCV: 96.1 fL (ref 78.0–100.0)
MONO ABS: 1 10*3/uL (ref 0.1–1.0)
MONOS PCT: 8 % (ref 3–12)
Neutro Abs: 10 10*3/uL — ABNORMAL HIGH (ref 1.7–7.7)
Neutrophils Relative %: 80 % — ABNORMAL HIGH (ref 43–77)
Platelets: 322 10*3/uL (ref 150–400)
RBC: 3.59 MIL/uL — AB (ref 4.22–5.81)
RDW: 18 % — ABNORMAL HIGH (ref 11.5–15.5)
WBC: 12.6 10*3/uL — AB (ref 4.0–10.5)

## 2014-11-27 LAB — APTT: aPTT: 35 seconds (ref 24–37)

## 2014-11-27 LAB — AMMONIA: AMMONIA: 26 umol/L (ref 9–35)

## 2014-11-27 LAB — PROTIME-INR
INR: 1.1 (ref 0.00–1.49)
Prothrombin Time: 14.4 seconds (ref 11.6–15.2)

## 2014-11-27 LAB — COMPLETE METABOLIC PANEL WITH GFR
ALT: 3 U/L — ABNORMAL LOW (ref 9–46)
Albumin: 2.5 g/dL — ABNORMAL LOW (ref 3.6–5.1)
Alkaline Phosphatase: 67 U/L (ref 40–115)
CO2: 25 mmol/L (ref 20–31)
Chloride: 100 mmol/L (ref 98–110)
GFR, Est African American: 12 mL/min — ABNORMAL LOW (ref >=60)
GFR, Est Non African American: 10 mL/min — ABNORMAL LOW (ref >=60)
Glucose, Bld: 115 mg/dL — ABNORMAL HIGH (ref 65–99)
Potassium: 3.5 mmol/L (ref 3.5–5.3)
Sodium: 138 mmol/L (ref 135–146)
Total Bilirubin: 0.3 mg/dL (ref 0.2–1.2)
Total Protein: 6.1 g/dL (ref 6.1–8.1)

## 2014-11-27 LAB — COMPLETE METABOLIC PANEL WITHOUT GFR
AST: 9 U/L — ABNORMAL LOW (ref 10–35)
BUN: 20 mg/dL (ref 7–25)
Calcium: 7.8 mg/dL — ABNORMAL LOW (ref 8.6–10.3)
Creat: 5.27 mg/dL — ABNORMAL HIGH (ref 0.70–1.18)

## 2014-11-27 LAB — I-STAT TROPONIN, ED: Troponin i, poc: 0.01 ng/mL (ref 0.00–0.08)

## 2014-11-27 LAB — I-STAT CG4 LACTIC ACID, ED
Lactic Acid, Venous: 2.06 mmol/L (ref 0.5–2.0)
Lactic Acid, Venous: 2.03 mmol/L (ref 0.5–2.0)

## 2014-11-27 LAB — MRSA PCR SCREENING: MRSA by PCR: NEGATIVE

## 2014-11-27 LAB — ETHANOL

## 2014-11-27 LAB — MAGNESIUM: Magnesium: 1.5 mg/dL — ABNORMAL LOW (ref 1.7–2.4)

## 2014-11-27 LAB — TSH: TSH: 1.42 u[IU]/mL (ref 0.350–4.500)

## 2014-11-27 MED ORDER — PIPERACILLIN-TAZOBACTAM 3.375 G IVPB 30 MIN
3.3750 g | Freq: Once | INTRAVENOUS | Status: AC
  Administered 2014-11-27: 3.375 g via INTRAVENOUS
  Filled 2014-11-27: qty 50

## 2014-11-27 MED ORDER — MAGNESIUM SULFATE 2 GM/50ML IV SOLN
2.0000 g | Freq: Once | INTRAVENOUS | Status: AC
  Administered 2014-11-27: 2 g via INTRAVENOUS
  Filled 2014-11-27: qty 50

## 2014-11-27 MED ORDER — THIAMINE HCL 100 MG/ML IJ SOLN
Freq: Once | INTRAVENOUS | Status: AC
  Administered 2014-11-27: 11:00:00 via INTRAVENOUS
  Filled 2014-11-27: qty 1000

## 2014-11-27 MED ORDER — LORAZEPAM 2 MG/ML IJ SOLN
1.0000 mg | Freq: Once | INTRAMUSCULAR | Status: AC
  Administered 2014-11-27: 1 mg via INTRAVENOUS
  Filled 2014-11-27: qty 1

## 2014-11-27 MED ORDER — LORAZEPAM 0.5 MG PO TABS: 1.0000 mg | ORAL_TABLET | Freq: Four times a day (QID) | ORAL | Status: DC | PRN

## 2014-11-27 MED ORDER — MORPHINE SULFATE 2 MG/ML IJ SOLN
1.0000 mg | INTRAMUSCULAR | Status: DC | PRN
  Administered 2014-11-28 – 2014-12-07 (×4): 1 mg via INTRAVENOUS
  Filled 2014-11-27 (×6): qty 1

## 2014-11-27 MED ORDER — PIPERACILLIN-TAZOBACTAM IN DEX 2-0.25 GM/50ML IV SOLN
2.2500 g | Freq: Three times a day (TID) | INTRAVENOUS | Status: DC
  Filled 2014-11-27 (×2): qty 50

## 2014-11-27 MED ORDER — ALBUTEROL SULFATE (2.5 MG/3ML) 0.083% IN NEBU: 2.5000 mg | INHALATION_SOLUTION | RESPIRATORY_TRACT | Status: DC | PRN

## 2014-11-27 MED ORDER — THIAMINE HCL 100 MG/ML IJ SOLN
100.0000 mg | Freq: Every day | INTRAMUSCULAR | Status: DC
  Administered 2014-11-28 – 2014-11-29 (×2): 100 mg via INTRAVENOUS
  Filled 2014-11-27 (×2): qty 1

## 2014-11-27 MED ORDER — CYANOCOBALAMIN 500 MCG PO TABS
500.0000 ug | ORAL_TABLET | Freq: Every day | ORAL | Status: DC
  Administered 2014-11-28 – 2014-12-02 (×5): 500 ug via ORAL
  Filled 2014-11-27 (×6): qty 1

## 2014-11-27 MED ORDER — ACETAMINOPHEN 325 MG PO TABS: 650.0000 mg | ORAL_TABLET | Freq: Four times a day (QID) | ORAL | Status: DC | PRN

## 2014-11-27 MED ORDER — SODIUM CHLORIDE 0.9 % IV SOLN
INTRAVENOUS | Status: AC
  Administered 2014-11-28 (×2): via INTRAVENOUS

## 2014-11-27 MED ORDER — VANCOMYCIN HCL IN DEXTROSE 1-5 GM/200ML-% IV SOLN
1000.0000 mg | Freq: Once | INTRAVENOUS | Status: AC
  Administered 2014-11-27: 1000 mg via INTRAVENOUS
  Filled 2014-11-27: qty 200

## 2014-11-27 MED ORDER — VITAMIN B-12 1000 MCG PO TABS
1000.0000 ug | ORAL_TABLET | Freq: Every day | ORAL | Status: DC
  Administered 2014-11-27 – 2014-12-02 (×6): 1000 ug via ORAL
  Filled 2014-11-27 (×6): qty 1

## 2014-11-27 MED ORDER — LORAZEPAM 2 MG/ML IJ SOLN: 1.0000 mg | Freq: Four times a day (QID) | INTRAMUSCULAR | Status: DC | PRN

## 2014-11-27 MED ORDER — LORAZEPAM 2 MG/ML IJ SOLN
0.0000 mg | Freq: Four times a day (QID) | INTRAMUSCULAR | Status: DC
  Administered 2014-11-27: 1 mg via INTRAVENOUS
  Administered 2014-11-28: 2 mg via INTRAVENOUS
  Filled 2014-11-27 (×2): qty 1

## 2014-11-27 MED ORDER — POTASSIUM CHLORIDE 20 MEQ/15ML (10%) PO SOLN
40.0000 meq | Freq: Once | ORAL | Status: AC
  Administered 2014-11-27: 40 meq via ORAL
  Filled 2014-11-27: qty 30

## 2014-11-27 MED ORDER — NICOTINE 21 MG/24HR TD PT24
21.0000 mg | MEDICATED_PATCH | Freq: Every day | TRANSDERMAL | Status: DC
  Administered 2014-11-27 – 2014-12-15 (×19): 21 mg via TRANSDERMAL
  Filled 2014-11-27 (×20): qty 1

## 2014-11-27 MED ORDER — ADULT MULTIVITAMIN W/MINERALS CH
1.0000 | ORAL_TABLET | Freq: Every day | ORAL | Status: DC
  Administered 2014-11-27 – 2014-12-15 (×19): 1 via ORAL
  Filled 2014-11-27 (×19): qty 1

## 2014-11-27 MED ORDER — FOLIC ACID 1 MG PO TABS
1.0000 mg | ORAL_TABLET | Freq: Every day | ORAL | Status: DC
  Administered 2014-11-27 – 2014-11-28 (×2): 1 mg via ORAL
  Filled 2014-11-27 (×3): qty 1

## 2014-11-27 MED ORDER — ACETAMINOPHEN 650 MG RE SUPP
650.0000 mg | Freq: Four times a day (QID) | RECTAL | Status: DC | PRN
  Filled 2014-11-27: qty 1

## 2014-11-27 MED ORDER — ENOXAPARIN SODIUM 30 MG/0.3ML ~~LOC~~ SOLN
30.0000 mg | SUBCUTANEOUS | Status: DC
  Administered 2014-11-27 – 2014-12-09 (×13): 30 mg via SUBCUTANEOUS
  Filled 2014-11-27 (×14): qty 0.3

## 2014-11-27 MED ORDER — OXYCODONE HCL 5 MG PO TABS
5.0000 mg | ORAL_TABLET | Freq: Four times a day (QID) | ORAL | Status: DC | PRN
  Administered 2014-11-28 – 2014-12-15 (×7): 5 mg via ORAL
  Filled 2014-11-27 (×8): qty 1

## 2014-11-27 MED ORDER — SODIUM CHLORIDE 0.9 % IJ SOLN
3.0000 mL | Freq: Two times a day (BID) | INTRAMUSCULAR | Status: DC
  Administered 2014-11-27 – 2014-12-13 (×13): 3 mL via INTRAVENOUS
  Administered 2014-12-13: 10 mL via INTRAVENOUS
  Administered 2014-12-15: 3 mL via INTRAVENOUS

## 2014-11-27 MED ORDER — LORAZEPAM 2 MG/ML IJ SOLN
0.0000 mg | Freq: Two times a day (BID) | INTRAMUSCULAR | Status: DC
Start: 2014-11-29 — End: 2014-11-28

## 2014-11-27 MED ORDER — VITAMIN B-1 100 MG PO TABS
100.0000 mg | ORAL_TABLET | Freq: Every day | ORAL | Status: DC
  Administered 2014-11-27 – 2014-12-15 (×17): 100 mg via ORAL
  Filled 2014-11-27 (×19): qty 1

## 2014-11-27 NOTE — ED Notes (Signed)
Pt went to urgent care yesterday-- had ultrasound on left leg-- has no circulation to foot -- per sister-- pt has black toes -- great and 2nd left toe. States he is supposed to have an amputation next week. Had labs drawn and had a high WBC count and was called and told to come here.

## 2014-11-27 NOTE — ED Provider Notes (Signed)
CSN: [id number]     Arrival date & time 11/27/14  0935 History   First MD Initiated Contact with Patient 11/27/14 [phone]     No chief complaint on file.   HPI   Leon Foster is a 74 y.o. male with a PMH of alcoholism and tobbaco abuse who was seen by vascular surgery 11/26/14 for dry gangrene of his left second toe, for which he has an ateriogram and possible intervention scheduled for Monday 11/30/14. He was noted to have an elevated creatinine of 5.27 and a leukocytosis of 11.7 at his appointment 11/26/14, and was called and advised to come to the ED this morning. No documented history of renal failure. He is a very poor historian, and is unable to provide information about why he is here. He denies headache, lightheadedness, dizziness, fever, chills, chest pain, shortness of breath, abdominal pain, nausea, vomiting, diarrhea, dysuria, urgency, frequency, flank pain, falls, recent illness or injury. He reports pain in his left foot. His sister was present at bedside, and she reports he has been more confused since January, and that his confusion has worsened over the past month. She states his last alcoholic drink was Monday. In clinic 11/26/14 he was noted to be undergoing treatment for a UTI with ciprofloxacin.    Past Medical History  Diagnosis Date  . Tobacco dependence   . Alcoholic   . AVM (arteriovenous malformation) of colon     colonoscopy 2011 [facility]    No past surgical history on file. Family History  Problem Relation Age of Onset  . Stroke Sister   . Deep vein thrombosis Sister   . Cancer Brother   . Diabetes Brother   . Hyperlipidemia Brother   . Hypertension Brother   . Heart disease Father   . Hypertension Sister    Social History  Substance Use Topics  . Smoking status: Current Every Day Smoker  . Smokeless tobacco: Never Used  . Alcohol Use: 1.2 oz/week    2 Cans of beer, 0 Standard drinks or equivalent per week     Comment: occasionally    Review of  Systems  Constitutional: Negative for fever and chills.  HENT: Negative for congestion.   Respiratory: Negative for cough and shortness of breath.   Cardiovascular: Negative for chest pain, palpitations and leg swelling.  Gastrointestinal: Negative for nausea, vomiting, abdominal pain, diarrhea, constipation and abdominal distention.  Genitourinary: Negative for dysuria, urgency, frequency, hematuria and flank pain.  Musculoskeletal: Positive for arthralgias and gait problem. Negative for back pain, neck pain and neck stiffness.       Reports pain to his left second toe, which causes him pain with walking.  Skin: Positive for color change. Negative for pallor, rash and wound.       Left second toe black.  Neurological: Positive for numbness. Negative for dizziness, syncope, weakness, light-headedness and headaches.       Reports decreased sensation to left second toe.  Psychiatric/Behavioral: Positive for confusion.    Allergies  Review of patient's allergies indicates no known allergies.  Home Medications   Prior to Admission medications   Medication Sig Start Date End Date Taking? Authorizing Provider  ciprofloxacin (CIPRO) 500 MG tablet 2 (two) times daily. 11/09/14   Historical Provider, MD  folic acid (FOLVITE) 1 MG tablet TK 1 T PO QD 11/09/14   Historical Provider, MD  potassium chloride (K-DUR) 10 MEQ tablet TK 1 T D 09/26/14   Historical Provider, MD  potassium chloride (  KLOR-CON) 20 MEQ packet Take by mouth once.    Historical Provider, MD  potassium chloride SA (K-DUR,KLOR-CON) 20 MEQ tablet Take 1 tablet (20 mEq total) by mouth 2 (two) times daily. 08/21/14 08/28/14  Ellwood Dense, MD    BP 127/78 mmHg  Pulse 90  Temp(Src) 97.8 F (36.6 C) (Oral)  Resp 11  SpO2 99% Physical Exam  Constitutional: No distress.  Thin, frail gentleman resting in bed in no acute distress.  HENT:  Head: Normocephalic and atraumatic.  Right Ear: External ear normal.  Left Ear: External ear  normal.  Nose: Nose normal.  Mouth/Throat: Uvula is midline. Mucous membranes are dry.  Eyes: Conjunctivae and EOM are normal. Pupils are equal, round, and reactive to light.  Neck: Normal range of motion. Neck supple. No spinous process tenderness and no muscular tenderness present.  Cardiovascular: Normal rate, regular rhythm and normal heart sounds.   Distal pulses intact in upper extremities bilaterally. Distal pulses in right lower extremity intact. Distal pulses in left lower extremity non-palpable.  Pulmonary/Chest: Effort normal. No respiratory distress. He has wheezes. He exhibits no tenderness.  Diffuse wheezing in upper lung fields bilaterally.  Abdominal: Soft. Bowel sounds are normal. He exhibits no distension and no mass. There is no tenderness. There is no rebound, no guarding and no CVA tenderness.  Musculoskeletal: He exhibits no edema.  Lymphadenopathy:    He has no cervical adenopathy.  Neurological: He is alert. He has normal strength. No cranial nerve deficit or sensory deficit.  Oriented to person and place, not to time.  Skin: Skin is warm and dry. No rash noted. He is not diaphoretic. No erythema. No pallor.  Left second toe black.  Nursing note and vitals reviewed.   ED Course  Procedures (including critical care time)  Labs Review Labs Reviewed  CBC WITH DIFFERENTIAL/PLATELET - Abnormal; Notable for the following:    WBC 12.6 (*)    RBC 3.59 (*)    Hemoglobin 11.6 (*)    HCT 34.5 (*)    RDW 18.0 (*)    Neutrophils Relative % 80 (*)    Neutro Abs 10.0 (*)    Lymphocytes Relative 11 (*)    All other components within normal limits  COMPREHENSIVE METABOLIC PANEL - Abnormal; Notable for the following:    Potassium 3.0 (*)    Glucose, Bld 110 (*)    BUN 21 (*)    Creatinine, Ser 7.20 (*)    Calcium 7.9 (*)    Albumin 2.4 (*)    ALT 5 (*)    GFR calc non Af Amer 7 (*)    GFR calc Af Amer 8 (*)    All other components within normal limits  URINALYSIS,  ROUTINE W REFLEX MICROSCOPIC (NOT AT [facility]) - Abnormal; Notable for the following:    APPearance HAZY (*)    Hgb urine dipstick TRACE (*)    Protein, ur 30 (*)    All other components within normal limits  MAGNESIUM - Abnormal; Notable for the following:    Magnesium 1.5 (*)    All other components within normal limits  I-STAT CG4 LACTIC ACID, ED - Abnormal; Notable for the following:    Lactic Acid, Venous 2.06 (*)    All other components within normal limits  I-STAT CG4 LACTIC ACID, ED - Abnormal; Notable for the following:    Lactic Acid, Venous 2.03 (*)    All other components within normal limits  URINE CULTURE  ETHANOL  AMMONIA  PROTIME-INR  APTT  URINE MICROSCOPIC-ADD ON  PROTEIN ELECTROPHORESIS, SERUM  KAPPA/LAMBDA LIGHT CHAINS  CREATININE, URINE, RANDOM  SODIUM, URINE, RANDOM  I-STAT TROPOININ, ED    Imaging Review Dg Chest 2 View  11/27/2014   CLINICAL DATA:  Ischemic toes and elevated white blood cell count.  EXAM: CHEST  2 VIEW  COMPARISON:  09/26/2004  FINDINGS: The cardiac silhouette, mediastinal and hilar contours are within normal limits and stable. There is mild tortuosity of the thoracic aorta. The lungs demonstrate mild stable emphysematous changes and hyperinflation. Bilateral nipple shadows are noted. No worrisome lesions. No acute pulmonary findings or pleural effusion. Remote healed bilateral rib fractures are noted.  IMPRESSION: Emphysematous changes but no acute pulmonary findings.   Electronically Signed   By: Marijo Sanes M.D.   On: 11/27/2014 11:12    I, Marella Chimes, personally reviewed and evaluated these images and lab results as part of my medical decision-making.   EKG Interpretation   Date/Time:  Friday November 27 2014 10:12:57 EDT Ventricular Rate:  90 PR Interval:  109 QRS Duration: 77 QT Interval:  382 QTC Calculation: 467 R Axis:   40 Text Interpretation:  Sinus rhythm Short PR interval Probable left atrial  enlargement  Borderline repolarization abnormality Baseline wander in  lead(s) I III aVL Confirmed by Hazle Coca [phone]) on 11/27/2014 10:23:18 AM      MDM   Final diagnoses:  AKI (acute kidney injury)  Leukocytosis    74 year old male with a PMH of alcoholism and tobacco abuse presents with acute renal failure. He was noted to have a creatinine of 5.27 and a leukocytosis of 11.7 on labs in clinic on 11/26/2014. This morning, his family member was called and was advised to bring the patient to the ED. On arrival, the patient was tachycardic, which subsequently resolved. Vital signs are stable. No tachycardia or hypotension. O2 sat 99%.  Lactic acid elevated at 2.06. Given NS with thiamine, folic acid, and multivitamin in the setting of alcoholism. Does not appear to be in acute alcohol withdrawal at this time. Repeat lactic acid 2.03.   CBC remarkable for leukocytosis (WBC 12.6). CMP with potassium 3, BUN 21, creatinine 7.20. Potassium repleted in the ED. Ethanol <5. EKG no acute ischemia. Troponin negative x 1. No evidence for ACS. CXR negative for acute pulmonary findings. No evidence for pneumonia. Urinalysis negative for infection. Urine culture pending. Foley placed to monitor response to fluids and urine output. Ammonia within normal limits at 26, coags unremarkable.   Vancomycin and zosyn ordered per pharmacy given concern for infection. Case discussed with Dr. Ralene Bathe, who spoke with Dr. Algis Liming. Patient to be admitted to medicine for further evaluation and management of acute renal failure.  BP 127/78 mmHg  Pulse 90  Temp(Src) 97.8 F (36.6 C) (Oral)  Resp 11  SpO2 99%     Marella Chimes, PA-C 11/27/14 [place], MD 11/27/14 [phone]

## 2014-11-27 NOTE — ED Notes (Signed)
Hongalgi, MD at bedside

## 2014-11-27 NOTE — Progress Notes (Signed)
ANTIBIOTIC CONSULT NOTE - INITIAL  Pharmacy Consult for Vancomycin and Zosyn Indication: Wound infection  No Known Allergies  Patient Measurements: TBW 54.4kg  Vital Signs: Temp: 97.8 F (36.6 C) (08/12 0948) Temp Source: Oral (08/12 0948) BP: 119/80 mmHg (08/12 1015) Pulse Rate: 84 (08/12 1015) Intake/Output from previous day:   Intake/Output from this shift:    Labs:  Recent Labs  11/26/14 1308 11/26/14 1311 11/27/14 1012  WBC  --  11.7* 12.6*  HGB  --  10.9* 11.6*  PLT  --   --  322  CREATININE 5.27*  --   --    Estimated Creatinine Clearance: 9.7 mL/min (by C-G formula based on Cr of 5.27). No results for input(s): VANCOTROUGH, VANCOPEAK, VANCORANDOM, GENTTROUGH, GENTPEAK, GENTRANDOM, TOBRATROUGH, TOBRAPEAK, TOBRARND, AMIKACINPEAK, AMIKACINTROU, AMIKACIN in the last 72 hours.   Microbiology: No results found for this or any previous visit (from the past 720 hour(s)).  Medical History: Past Medical History  Diagnosis Date  . Tobacco dependence   . Alcoholic   . AVM (arteriovenous malformation) of colon     colonoscopy 2011 [facility]     Assessment: 74 yo M presents on 8/12 with dry gangrene of his L second toe. PMH includes alcoholism and tobacco abuse. He saw vascular surgery yesterday for which he has an arteriogram scheduled for 8/15. Was noted to have an elevated SCr of 5.27 and WBC of 11.7 at the appointment so was advised to come to the ED. Pharmacy consulted to start broad spectrum abx for wound infection. Of note was recently treated for a UTI. Pt is afebrile, WBC elevated at 12.6. SCr elevated to 7.2, CrCl ~ < 72ml/min.  Goal of Therapy:  Vancomycin trough level 10-15 mcg/ml  Resolution of infection  Plan:  Give vancomycin 1g IV x 1 Give Zosyn 3.375g IV (30 min infusion) x 1, then start 2.25g IV Q8 Monitor clinical picture, renal function, VT prn F/U C&S, abx deescalation / LOT Consider VR ~48 hours from first vancomycin dose (8/14) if renal  function does not improve. If improvement, may be able to start patient on 500mg  IV Q48  Natane Heward J 11/27/2014,10:42 AM

## 2014-11-27 NOTE — ED Provider Notes (Signed)
Pt here with acute renal failure, d/w Dr. Algis Liming and he will admit the patient.    Quintella Reichert, MD 11/27/14 1504

## 2014-11-27 NOTE — Telephone Encounter (Signed)
Spoke with sister who I saw with him yesterday spoke to her about lab and advise to go to [facility] ER Creatinine is 5.27 and he is confused. Will call sister Inez Catalina [phone] as well per request. [facility] ER notified.

## 2014-11-27 NOTE — H&P (Signed)
History and Physical  Leon Foster QGB:[id number] DOB: [birth year] DOA: 11/27/2014  Referring physician: Dr. Marin Roberts, EDP PCP: Philis Fendt, MD  Outpatient Specialists:  1. Vascular Surgery: Dr. Ruta Hinds.  Chief Complaint: Left foot pain  HPI: Leon Foster is a 74 y.o. male, single, lives alone, PMH of heavy alcohol and the back or abuse since age 29, no other significant past medical history, evaluated at urgent care center and by vascular surgery/Dr. Ruta Hinds on 11/26/14 for gangrenous left second toe and labs which were drawn yesterday showed acute renal failure with creatinine 5.27 and family was advised to bring patient to [facility] ED. Patient is unable to provide any history secondary to confusion. History is obtained from patient's brother and 2 sisters at bedside. According to them, patient has been complaining of left foot pain for approximately a year which progressively got worse in the last 2 weeks. One of the sisters looked at his foot for the first time 3 nights ago and noticed black discoloration of second left toe. He was taken to urgent care center yesterday and then evaluated by vascular surgery who planned for an aortogram and possible intervention on 11/30/14. At this time patient is not complaining of foot pain. He does not know why he is in the hospital. According to family, he has intermittent urinary and fecal incontinence. He apparently had some "shakes" yesterday. Apart from this, family denies any other complaints. No reported chest pain, dyspnea, cough, fever, chills, nausea or vomiting. In the ED, repeat labs show creatinine of 7.20 (normal creatinine of 1.06 on 08/20/14), lactate 2.06, normal ammonia and INR, hemoglobin 11.6 WBC 12.6, chest x-ray with emphysema but no acute findings and left foot x-ray shows no evidence of osteomyelitis. Patient was empirically started on IV vancomycin and Zosyn for presumed cellulitis of left toe/foot. Hospitalist  admission was requested.    Review of Systems: All systems reviewed and apart from history of presenting illness, are negative.  Past Medical History  Diagnosis Date  . Tobacco dependence   . Alcoholic   . AVM (arteriovenous malformation) of colon     colonoscopy 2011 [facility]    History reviewed. No pertinent past surgical history. Social History:  reports that he has been smoking.  He has never used smokeless tobacco. He reports that he drinks alcohol. He reports that he does not use illicit drugs.  has been smoking a pack of cigarettes per day since age 63. Drinks 2-3 pints of wine since age 32. Last drink unknown.   No Known Allergies  Family History  Problem Relation Age of Onset  . Stroke Sister   . Deep vein thrombosis Sister   . Cancer Brother   . Diabetes Brother   . Hyperlipidemia Brother   . Hypertension Brother   . Heart disease Father   . Hypertension Sister     Prior to Admission medications   Medication Sig Start Date End Date Taking? Authorizing Provider  acetaminophen (TYLENOL) 325 MG tablet Take 650 mg by mouth every 6 (six) hours as needed for headache.   Yes Historical Provider, MD  ciprofloxacin (CIPRO) 500 MG tablet Take 500 mg by mouth 2 (two) times daily.  11/09/14 12/25/14 Yes Historical Provider, MD  cyanocobalamin 500 MCG tablet Take 500 mcg by mouth daily.   Yes Historical Provider, MD  folic acid (FOLVITE) 1 MG tablet TAKE 1 TAB BY MOUTH ONCE DAILY 11/09/14  Yes Historical Provider, MD  potassium chloride SA (K-DUR,KLOR-CON)  20 MEQ tablet Take 1 tablet (20 mEq total) by mouth 2 (two) times daily. 08/21/14 11/27/14 Yes Ellwood Dense, MD   Physical Exam: Filed Vitals:   11/27/14 1045 11/27/14 1115 11/27/14 1145 11/27/14 1200  BP: 131/80   130/81  Pulse: 85 88 81 84  Temp:      TempSrc:      Resp: 16 13 12 14   SpO2: 100% 100% 100% 100%   temperature: 97.52F.   General exam: Moderately built and poorly nourished disheveled male patient, lying  comfortably supine on the gurney in no obvious distress.  Head, eyes and ENT: Nontraumatic and normocephalic. Pupils equally reacting to light and accommodation. Oral mucosa dry. Very poor dental hygiene with multiple missing teeth and caries. Bilateral near mature cataract and arcus senilis.  Neck: Supple. No JVD, carotid bruit or thyromegaly.  Lymphatics: No lymphadenopathy.  Respiratory system: Clear to auscultation. No increased work of breathing.  Cardiovascular system: S1 and S2 heard, RRR. No JVD, murmurs, gallops, clicks or pedal edema.  Gastrointestinal system: Abdomen is nondistended, soft and nontender. Normal bowel sounds heard. No organomegaly or masses appreciated. Foley catheter + (placed in ED on 8/12)   Central nervous system: Alert and oriented only to person and place . No focal neurological deficits.  Extremities: Symmetric 5 x 5 power.  left foot is cold to touch. Absent left dorsalis pedis and posterior tibial pulsations. Left second toe with dry gangrene and no features suggestive of infection/cellulitis or abscess.  Skin: No rashes or acute findings.  Musculoskeletal system: Negative exam.  Psychiatry: Pleasant and cooperative.   Labs on Admission:  Basic Metabolic Panel:  Recent Labs Lab 11/26/14 1308 11/27/14 1000 11/27/14 1012  NA 138  --  138  K 3.5  --  3.0*  CL 100  --  101  CO2 25  --  22  GLUCOSE 115*  --  110*  BUN 20  --  21*  CREATININE 5.27*  --  7.20*  CALCIUM 7.8*  --  7.9*  MG  --  1.5*  --    Liver Function Tests:  Recent Labs Lab 11/26/14 1308 11/27/14 1012  AST 9* 17  ALT 3* 5*  ALKPHOS 67 69  BILITOT 0.3 0.6  PROT 6.1 6.5  ALBUMIN 2.5* 2.4*   No results for input(s): LIPASE, AMYLASE in the last 168 hours.  Recent Labs Lab 11/27/14 1043  AMMONIA 26   CBC:  Recent Labs Lab 11/26/14 1311 11/27/14 1012  WBC 11.7* 12.6*  NEUTROABS  --  10.0*  HGB 10.9* 11.6*  HCT 34.7* 34.5*  MCV 96.1 96.1  PLT  --  322    Cardiac Enzymes: No results for input(s): CKTOTAL, CKMB, CKMBINDEX, TROPONINI in the last 168 hours.  BNP (last 3 results) No results for input(s): PROBNP in the last 8760 hours. CBG: No results for input(s): GLUCAP in the last 168 hours.  Radiological Exams on Admission: Dg Chest 2 View  11/27/2014   CLINICAL DATA:  Ischemic toes and elevated white blood cell count.  EXAM: CHEST  2 VIEW  COMPARISON:  09/26/2004  FINDINGS: The cardiac silhouette, mediastinal and hilar contours are within normal limits and stable. There is mild tortuosity of the thoracic aorta. The lungs demonstrate mild stable emphysematous changes and hyperinflation. Bilateral nipple shadows are noted. No worrisome lesions. No acute pulmonary findings or pleural effusion. Remote healed bilateral rib fractures are noted.  IMPRESSION: Emphysematous changes but no acute pulmonary findings.   Electronically Signed  By: Marijo Sanes M.D.   On: 11/27/2014 11:12   Dg Foot Complete Left  11/26/2014   CLINICAL DATA:  Left second toe coldness and black tennis  EXAM: LEFT FOOT - COMPLETE 3+ VIEW  COMPARISON:  None.  FINDINGS: Tarsal-metatarsal alignment is normal. No evidence of osteomyelitis is seen. No or definite erosion is seen. However, a small erosion involving the base of the proximal phalanx of the left great toe cannot be excluded on the oblique view. Is there any history of gout?  IMPRESSION: No evidence of osteomyelitis. Difficult to exclude a small erosion involving the base of the proximal phalanx of the left great toe.   Electronically Signed   By: Ivar Drape M.D.   On: 11/26/2014 13:17    EKG: Independently reviewed.  sinus rhythm, normal axis and no acute changes. QTC 467 ms.  Assessment/Plan Principal Problem:   Acute renal failure Active Problems:   Gangrene of toe-left second toe   Hypokalemia   Dehydration   Tobacco abuse   Alcohol dependence   Confusion   PAD (peripheral artery disease)   1. Acute  renal failure: Normal creatinine 1.06 on 08/20/14. Admitting creatinine 7.2 on 11/27/14. DD-dehydration, rule out obstructive nephropathy, antibiotic related to acute interstitial nephritis versus ATN (no record of hypotension). Admit to stepdown. Obtain urine random sodium, creatinine to calculate FeNA. Check renal ultrasound to rule out obstruction. Continue Foley catheter placed in ED to monitor strict intake and output (patient has history of urinary incontinence)-very little urine output in the ED post cath. Avoid nephrotoxic medications. Aggressive IV fluid hydration and follow BMP. Nephrology consulted. No acute dialysis needs at this time. 2. Dehydration: Secondary to poor oral intake. IV fluids 3. Hypokalemia: Patient received a dose of oral potassium 40 Meq in ED. Follow BMP in a.m. and careful with replacement given acute kidney injury. 4. Gangrenous left second toe/PAD: Does not seem to be infected and likely. Mild elevated WBC may be related to gangrene itself. Will not continue IV antibiotics. Discussed with Dr. Oneida Alar, vascular surgery who will assess patient in the hospital on 11/29/14 and determine timing of evaluation and surgical intervention. Patient may need left BKA. 5. Tobacco abuse: Cessation counseling when patient able to comprehend. Nicotine patch. 6. Alcohol dependence with early withdrawal: At high risk for full-blown DTs. Hence we'll admit to stepdown unit. CIWA protocol including scheduled Ativan.  7. COPD: Stable. 8. Confusion: DD-early alcohol withdrawal, acute renal failure and possible uremia versus dementia. Ammonia level normal. No focal deficits. Monitor with treatment as above.    DVT prophylaxis: Lovenox Code Status:  Full-confirmed with family at bedside.  Family Communication:  Discussed with patient's brother and 2 sisters at bedside.  Disposition Plan:  To be determined. He may require 5-7 days hospital stay including management of medical conditions and possible  surgery. Postoperatively he may require SNF.  Time spent:  28 minutes  Delontae Lamm, MD, FACP, FHM. [facility] Pager [phone]  If 7PM-7AM, please contact night-coverage [email or web address] Password [facility] 11/27/2014, 12:45 PM

## 2014-11-27 NOTE — Consult Note (Signed)
Leon Foster is an 74 y.o. male referred by Dr Leon Foster   Chief Complaint: ARF HPI:  74 yo male who was told to come to ER as outpt labs showed Scr 5.27 yest and now 7.2.  Last Scr in [facility] 5/16 was 1.06 but he was seen at [facility] on 11/05/14 and Scr was .7.  He was seen yest at urgent care because his sister noticed a black toe on his left foot.  PO intake marginal except for 2-3 pints/d of ETOH until his money runs.  Family has noticed over the last 6 months or so that he has been forgetting things and they are concerned about dementia.  No gross hematuria, no NSAID use. Foley placed with return of only small amt of urine.  SBP 92 in ER.  He was placed on cipr 2 weeks ago for ? UTI.  Unable to get hx of any ingested toxins. Past Medical History  Diagnosis Date  . Tobacco dependence   . Alcoholic   . AVM (arteriovenous malformation) of colon     colonoscopy 2011 [facility]     History reviewed. No pertinent past surgical history.  Family History  Problem Relation Age of Onset  . Stroke Sister   . Deep vein thrombosis Sister   . Cancer Brother   . Diabetes Brother   . Hyperlipidemia Brother   . Hypertension Brother   . Heart disease Father   . Hypertension Sister    Nephew with ESRD ? Etiology  Social History:  reports that he has been smoking.  He has never used smokeless tobacco. He reports that he drinks alcohol. He reports that he does not use illicit drugs.  Allergies: No Known Allergies   (Not in a hospital admission)   Lab Results: UA: 30mg  protein, 0-2 rbc and wbc   Recent Labs  11/26/14 1311 11/27/14 1012  WBC 11.7* 12.6*  HGB 10.9* 11.6*  HCT 34.7* 34.5*  PLT  --  322   BMET  Recent Labs  11/26/14 1308 11/27/14 1012  NA 138 138  K 3.5 3.0*  CL 100 101  CO2 25 22  GLUCOSE 115* 110*  BUN 20 21*  CREATININE 5.27* 7.20*  CALCIUM 7.8* 7.9*   LFT  Recent Labs  11/27/14 1012  PROT 6.5  ALBUMIN 2.4*  AST 17  ALT 5*  ALKPHOS 69  BILITOT  0.6   Dg Chest 2 View  11/27/2014   CLINICAL DATA:  Ischemic toes and elevated white blood cell count.  EXAM: CHEST  2 VIEW  COMPARISON:  09/26/2004  FINDINGS: The cardiac silhouette, mediastinal and hilar contours are within normal limits and stable. There is mild tortuosity of the thoracic aorta. The lungs demonstrate mild stable emphysematous changes and hyperinflation. Bilateral nipple shadows are noted. No worrisome lesions. No acute pulmonary findings or pleural effusion. Remote healed bilateral rib fractures are noted.  IMPRESSION: Emphysematous changes but no acute pulmonary findings.   Electronically Signed   By: Leon Foster M.D.   On: 11/27/2014 11:12   Dg Foot Complete Left  11/26/2014   CLINICAL DATA:  Left second toe coldness and black tennis  EXAM: LEFT FOOT - COMPLETE 3+ VIEW  COMPARISON:  None.  FINDINGS: Tarsal-metatarsal alignment is normal. No evidence of osteomyelitis is seen. No or definite erosion is seen. However, a small erosion involving the base of the proximal phalanx of the left great toe cannot be excluded on the oblique view. Is there any history of  gout?  IMPRESSION: No evidence of osteomyelitis. Difficult to exclude a small erosion involving the base of the proximal phalanx of the left great toe.   Electronically Signed   By: Leon Foster M.D.   On: 11/26/2014 13:17    ROS: No change in vision No SOB No CP No ABd pain CO pain Lt foot No rashes No dysuria   PHYSICAL EXAM: Blood pressure 123/74, pulse 89, temperature 97.8 F (36.6 C), temperature source Oral, resp. rate 21, SpO2 100 %. HEENT: PERRLA EOMI NECK:No JVD LUNGS:Clear CARDIAC:RRR wo MRG ABD:+ BS NTND  EXT:no edema  Lt 2nd toe black with mild erythema over dorsal aspect lt foot.  No overt evidence of cholesterol emboli Pulses:  0/4 DP and PT NEURO:[facility] OX2 (did not know the year) No asterixis  Assessment: 1. Acute/Subacute renal failure ? If simply due to ATN from hypotension and volume depletion  given relatively benign UA.  I do not see evidence of obvious chol emboli and he has no eosinophilia.  Small amt protein so will RO dysproteinemia.   2. ETOH abuse 3. 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 hypokalemia PLAN: 1. Check renal US 2. UNa and UCr 3.  Check SPEP 4. Empiric AB 5. Daily Scr 6. Discussed the possibility of HD if renal fx does not improve 7. Replace K 8. IV fluids   Leon Foster T 11/27/2014, 2:25 PM

## 2014-11-27 NOTE — ED Notes (Signed)
Attempted report 

## 2014-11-28 DIAGNOSIS — I429 Cardiomyopathy, unspecified: Secondary | ICD-10-CM

## 2014-11-28 DIAGNOSIS — N179 Acute kidney failure, unspecified: Secondary | ICD-10-CM | POA: Diagnosis present

## 2014-11-28 DIAGNOSIS — Z720 Tobacco use: Secondary | ICD-10-CM

## 2014-11-28 DIAGNOSIS — I43 Cardiomyopathy in diseases classified elsewhere: Secondary | ICD-10-CM

## 2014-11-28 DIAGNOSIS — I96 Gangrene, not elsewhere classified: Secondary | ICD-10-CM | POA: Diagnosis present

## 2014-11-28 DIAGNOSIS — I119 Hypertensive heart disease without heart failure: Secondary | ICD-10-CM | POA: Diagnosis present

## 2014-11-28 DIAGNOSIS — F1010 Alcohol abuse, uncomplicated: Secondary | ICD-10-CM | POA: Diagnosis present

## 2014-11-28 DIAGNOSIS — R410 Disorientation, unspecified: Secondary | ICD-10-CM

## 2014-11-28 LAB — RAPID URINE DRUG SCREEN, HOSP PERFORMED
Amphetamines: NOT DETECTED
BARBITURATES: NOT DETECTED
BENZODIAZEPINES: NOT DETECTED
Cocaine: NOT DETECTED
Opiates: NOT DETECTED
TETRAHYDROCANNABINOL: NOT DETECTED

## 2014-11-28 LAB — RENAL FUNCTION PANEL
ALBUMIN: 2.1 g/dL — AB (ref 3.5–5.0)
ANION GAP: 12 (ref 5–15)
Albumin: 1.9 g/dL — ABNORMAL LOW (ref 3.5–5.0)
Anion gap: 11 (ref 5–15)
BUN: 21 mg/dL — AB (ref 6–20)
BUN: 21 mg/dL — AB (ref 6–20)
CHLORIDE: 105 mmol/L (ref 101–111)
CO2: 22 mmol/L (ref 22–32)
CO2: 20 mmol/L — AB (ref 22–32)
Calcium: 7.6 mg/dL — ABNORMAL LOW (ref 8.9–10.3)
Calcium: 6.9 mg/dL — ABNORMAL LOW (ref 8.9–10.3)
Chloride: 108 mmol/L (ref 101–111)
Creatinine, Ser: 6.91 mg/dL — ABNORMAL HIGH (ref 0.61–1.24)
Creatinine, Ser: 6.88 mg/dL — ABNORMAL HIGH (ref 0.61–1.24)
GFR calc Af Amer: 8 mL/min — ABNORMAL LOW (ref >60)
GFR, EST AFRICAN AMERICAN: 8 mL/min — AB (ref >60)
GFR, EST NON AFRICAN AMERICAN: 7 mL/min — AB (ref >60)
GFR, EST NON AFRICAN AMERICAN: 7 mL/min — AB (ref >60)
Glucose, Bld: 91 mg/dL (ref 65–99)
Glucose, Bld: 121 mg/dL — ABNORMAL HIGH (ref 65–99)
PHOSPHORUS: 3.9 mg/dL (ref 2.5–4.6)
POTASSIUM: 3.4 mmol/L — AB (ref 3.5–5.1)
POTASSIUM: 3.3 mmol/L — AB (ref 3.5–5.1)
Phosphorus: 4.4 mg/dL (ref 2.5–4.6)
Sodium: 138 mmol/L (ref 135–145)
Sodium: 140 mmol/L (ref 135–145)

## 2014-11-28 LAB — CBC
HCT: 31.2 % — ABNORMAL LOW (ref 39.0–52.0)
HEMATOCRIT: 30.6 % — AB (ref 39.0–52.0)
HEMOGLOBIN: 10.3 g/dL — AB (ref 13.0–17.0)
Hemoglobin: 10.6 g/dL — ABNORMAL LOW (ref 13.0–17.0)
MCH: 31.7 pg (ref 26.0–34.0)
MCH: 32.5 pg (ref 26.0–34.0)
MCHC: 33.7 g/dL (ref 30.0–36.0)
MCHC: 34 g/dL (ref 30.0–36.0)
MCV: 94.2 fL (ref 78.0–100.0)
MCV: 95.7 fL (ref 78.0–100.0)
Platelets: 318 10*3/uL (ref 150–400)
Platelets: 356 10*3/uL (ref 150–400)
RBC: 3.25 MIL/uL — AB (ref 4.22–5.81)
RBC: 3.26 MIL/uL — AB (ref 4.22–5.81)
RDW: 17.8 % — ABNORMAL HIGH (ref 11.5–15.5)
RDW: 18 % — ABNORMAL HIGH (ref 11.5–15.5)
WBC: 13.4 10*3/uL — AB (ref 4.0–10.5)
WBC: 14.3 10*3/uL — AB (ref 4.0–10.5)

## 2014-11-28 LAB — HEMOGLOBIN A1C
Hgb A1c MFr Bld: 5 % (ref 4.8–5.6)
Mean Plasma Glucose: 97 mg/dL

## 2014-11-28 LAB — SODIUM, URINE, RANDOM: Sodium, Ur: 48 mmol/L

## 2014-11-28 LAB — C DIFFICILE QUICK SCREEN W PCR REFLEX
C DIFFICILE (CDIFF) INTERP: NEGATIVE
C DIFFICILE (CDIFF) TOXIN: NEGATIVE
C Diff antigen: NEGATIVE

## 2014-11-28 LAB — LACTIC ACID, PLASMA: Lactic Acid, Venous: 1.4 mmol/L (ref 0.5–2.0)

## 2014-11-28 LAB — URINE CULTURE: Culture: NO GROWTH

## 2014-11-28 LAB — ETHANOL

## 2014-11-28 LAB — CREATININE, URINE, RANDOM: Creatinine, Urine: 64.69 mg/dL

## 2014-11-28 MED ORDER — POTASSIUM CHLORIDE 10 MEQ/100ML IV SOLN
10.0000 meq | INTRAVENOUS | Status: AC
Start: 2014-11-28 — End: 2014-11-29
  Administered 2014-11-28 – 2014-11-29 (×4): 10 meq via INTRAVENOUS
  Filled 2014-11-28 (×4): qty 100

## 2014-11-28 MED ORDER — FOLIC ACID 5 MG/ML IJ SOLN
1.0000 mg | Freq: Every day | INTRAMUSCULAR | Status: DC
  Administered 2014-11-29: 1 mg via INTRAVENOUS
  Filled 2014-11-28: qty 0.2

## 2014-11-28 MED ORDER — SODIUM CHLORIDE 0.9 % IV SOLN
INTRAVENOUS | Status: DC
  Administered 2014-11-28 – 2014-11-30 (×2): via INTRAVENOUS

## 2014-11-28 MED ORDER — LORAZEPAM 2 MG/ML IJ SOLN
2.0000 mg | INTRAMUSCULAR | Status: DC | PRN
  Administered 2014-11-28 – 2014-12-02 (×6): 2 mg via INTRAVENOUS
  Filled 2014-11-28 (×6): qty 1

## 2014-11-28 NOTE — Progress Notes (Signed)
Pt attempting to get out of bed and has pulled safety mittens off and pulled IV lines out. Pt stating that he has to leave. Pt reoriented that he is in the hospital but pt continues to state that he has to go to work. Pt assisted on BSC and assisted back to the bed. Pt attempting to pull foley catheter asking for it to be taken out. Order obtained for bilateral soft wrist restraints.

## 2014-11-28 NOTE — Progress Notes (Signed)
[facility] TEAM 1 - Stepdown/ICU TEAM Progress Note  Leon Foster XBM:[id number] DOB: 12-14-1940 DOA: 11/27/2014 PCP: Philis Fendt, MD  Admit HPI / Brief Narrative: 74 y.o. BM single, lives alone, PMHx of heavy alcohol tobacco abuse since age 42 (,smokes 1ppd. He reports drinking two bottles of wine )  Evaluated at urgent care center and by vascular surgery/Dr. Ruta Hinds on 11/26/14 for gangrenous left second toe and labs which were drawn yesterday showed acute renal failure with creatinine 5.27 and family was advised to bring patient to [facility] ED. Patient is unable to provide any history secondary to confusion. History is obtained from patient's brother and 2 sisters at bedside. According to them, patient has been complaining of left foot pain for approximately a year which progressively got worse in the last 2 weeks. One of the sisters looked at his foot for the first time 3 nights ago and noticed black discoloration of second left toe. He was taken to urgent care center yesterday and then evaluated by vascular surgery who planned for an aortogram and possible intervention on 11/30/14. At this time patient is not complaining of foot pain. He does not know why he is in the hospital. According to family, he has intermittent urinary and fecal incontinence. He apparently had some "shakes" yesterday. Apart from this, family denies any other complaints. No reported chest pain, dyspnea, cough, fever, chills, nausea or vomiting. In the ED, repeat labs show creatinine of 7.20 (normal creatinine of 1.06 on 08/20/14), lactate 2.06, normal ammonia and INR, hemoglobin 11.6 WBC 12.6, chest x-ray with emphysema but no acute findings and left foot x-ray shows no evidence of osteomyelitis. Patient was empirically started on IV vancomycin and Zosyn for presumed cellulitis of left toe/foot. Hospitalist admission was requested.    HPI/Subjective: 8/13 patient sleepy but arousable. Will not verbalize answers but does  follow commands.  Assessment/Plan: Dry gangrene left second metatarsal/sepsis? -Per vascular surgeryAortogram with bilateral runoff and possible intervention with Dr. Oneida Alar on Monday 11/30/14. Discussed that if he has extensive disease not amenable to percutaneous intervention, may require bypass or AKA   vs BKA  -Blood cultures pending -Urine cultures pending  Acute renal failure(Normal Cr 1.06 on 08/20/14). -Admitting creatinine 7.2 on 11/27/14.  -8/13  Cr = 6.91  -Family understands that patient may wind up on hemodialysis. -Continue aggressive hydration normal saline 125 ml/hr -Strict I&O since admission  +1.6L -Daily a.m. weight  -Renal ultrasound; no obstruction see results below -Patient with continued poor output; nephrology will decide if/when HD required -Avoid nephrotoxic medication  Dehydration -Continue aggressive IV fluids normal saline 75 mL/hr; until we determine cardiac status  Cardiomyopathy? -Patient is an alcoholic and heavy smoker which predisposes toward alcoholic cardiomyopathy. Obtain echocardiogram to assess cardiac status  Hypokalemia -Potassium IV 40 mEq 1  Tobacco abuse:  -Continue nicotine patch  Alcohol abuse -continue CIWA protocolCOPD: Stable.  Altered mental status  -Most likely secondary to alcohol abuse     Code Status: FULL Family Communication: family present at time of exam Disposition Plan: Vascular surgery    Consultants: Dr.Michael Mercy Moore (nephrology) Dr.Charistopher E Fields (vascular surgery)   Procedure/Significant Events: 8/12 renal ultrasound;Small echogenic foci w/i left kidney small nonobstructing stones.   Culture 8/12 urine negative 8/12 MRSA by PCR negative 8/13 urine pending 8/13 blood culture 2 pending   Antibiotics: Zosyn 8/12>> Vancomycin 8/12>  DVT prophylaxis: Heparin   Devices    LINES / TUBES:      Continuous Infusions: . sodium chloride  125 mL/hr at 11/28/14 1100     Objective: VITAL SIGNS: Temp: 98.5 F (36.9 C) (08/13 0854) Temp Source: Oral (08/13 0854) BP: 138/91 mmHg (08/13 0854) Pulse Rate: 106 (08/13 0854) SPO2; FIO2:   Intake/Output Summary (Last 24 hours) at 11/28/14 1159 Last data filed at 11/28/14 1100  Gross per 24 hour  Intake 2130.83 ml  Output    505 ml  Net 1625.83 ml     Exam: General: sleepy but arousable. Will not verbalize answers but does follow commands, cachectic, unkempt, No acute respiratory distress Eyes: negative scleral hemorrhage ENT: Negative Runny nose, negative gingival bleeding, poor dentation. Neck:  Negative scars, masses, torticollis, lymphadenopathy, JVD Lungs: Clear to auscultation bilaterally without wheezes or crackles Cardiovascular: Tachycardic, Regular rhythm without murmur gallop or rub normal S1 and S2 Abdomen:negative abdominal pain, negative dysphagia, nondistended, positive soft, bowel sounds, no rebound, no ascites, no appreciable mass Extremities: No significant cyanosis, clubbing, or edema on the RLE. However LLE DP/PT pulse not palpable, foot cool to touch, metatarsals 1 through 4 dusky, painful to the touch; the worst is first and second metatarsal followed by the fifth metatarsal. Psychiatric: Unable to assess      Data Reviewed: Basic Metabolic Panel:  Recent Labs Lab 11/26/14 1308 11/27/14 1000 11/27/14 1012 11/28/14 0249  NA 138  --  138 138  K 3.5  --  3.0* 3.4*  CL 100  --  101 105  CO2 25  --  22 22  GLUCOSE 115*  --  110* 91  BUN 20  --  21* 21*  CREATININE 5.27*  --  7.20* 6.91*  CALCIUM 7.8*  --  7.9* 7.6*  MG  --  1.5*  --   --   PHOS  --   --   --  4.4   Liver Function Tests:  Recent Labs Lab 11/26/14 1308 11/27/14 1012 11/28/14 0249  AST 9* 17  --   ALT 3* 5*  --   ALKPHOS 67 69  --   BILITOT 0.3 0.6  --   PROT 6.1 6.5  --   ALBUMIN 2.5* 2.4* 2.1*   No results for input(s): LIPASE, AMYLASE in the last 168 hours.  Recent Labs Lab  11/27/14 1043  AMMONIA 26   CBC:  Recent Labs Lab 11/26/14 1311 11/27/14 1012 11/28/14 0249  WBC 11.7* 12.6* 13.4*  NEUTROABS  --  10.0*  --   HGB 10.9* 11.6* 10.3*  HCT 34.7* 34.5* 30.6*  MCV 96.1 96.1 94.2  PLT  --  322 318   Cardiac Enzymes: No results for input(s): CKTOTAL, CKMB, CKMBINDEX, TROPONINI in the last 168 hours. BNP (last 3 results) No results for input(s): BNP in the last 8760 hours.  ProBNP (last 3 results) No results for input(s): PROBNP in the last 8760 hours.  CBG: No results for input(s): GLUCAP in the last 168 hours.  Recent Results (from the past 240 hour(s))  MRSA PCR Screening     Status: None   Collection Time: 11/27/14  5:27 PM  Result Value Ref Range Status   MRSA by PCR NEGATIVE NEGATIVE Final    Comment:        The GeneXpert MRSA Assay (FDA approved for NASAL specimens only), is one component of a comprehensive MRSA colonization surveillance program. It is not intended to diagnose MRSA infection nor to guide or monitor treatment for MRSA infections.      Studies:  Recent x-ray studies have been reviewed in detail by the  Attending Physician  Scheduled Meds:  Scheduled Meds: . cyanocobalamin  500 mcg Oral Daily  . enoxaparin (LOVENOX) injection  30 mg Subcutaneous Q24H  . folic acid  1 mg Oral Daily  . LORazepam  0-4 mg Intravenous Q6H   Followed by  . [START ON 11/29/2014] LORazepam  0-4 mg Intravenous Q12H  . multivitamin with minerals  1 tablet Oral Daily  . nicotine  21 mg Transdermal Daily  . sodium chloride  3 mL Intravenous Q12H  . thiamine  100 mg Oral Daily   Or  . thiamine  100 mg Intravenous Daily  . cyanocobalamin  1,000 mcg Oral Daily    Time spent on care of this patient: 40 mins   Daizee Firmin, Geraldo Docker , MD  [facility] Office  [phone] Pager - [phone]  On-Call/Text Page:      [email or web address]      password [email or web address]  If 7PM-7AM, please contact night-coverage [email or web address] Password  [email or web address] 11/28/2014, 11:59 AM   LOS: 1 day   Care during the described time interval was provided by me .  I have reviewed this patient's available data, including medical history, events of note, physical examination, and all test results as part of my evaluation. I have personally reviewed and interpreted all radiology studies.   Dia Crawford, MD [phone] Pager

## 2014-11-28 NOTE — Progress Notes (Signed)
S: Eating breakfast O:BP 138/91 mmHg  Pulse 106  Temp(Src) 98.5 F (36.9 C) (Oral)  Resp 15  Ht 5\' 7"  (1.702 m)  Wt 53.9 kg (118 lb 13.3 oz)  BMI 18.61 kg/m2  SpO2 100%  Intake/Output Summary (Last 24 hours) at 11/28/14 1046 Last data filed at 11/28/14 0900  Gross per 24 hour  Intake 1190.83 ml  Output    505 ml  Net 685.83 ml   Weight change:  CBJ:SEGBT and alert CVS:RRR Resp:clear Abd:+ BS NTND Ext:no edema NEURO:Ox1, No asterixis   . cyanocobalamin  500 mcg Oral Daily  . enoxaparin (LOVENOX) injection  30 mg Subcutaneous Q24H  . folic acid  1 mg Oral Daily  . LORazepam  0-4 mg Intravenous Q6H   Followed by  . [START ON 11/29/2014] LORazepam  0-4 mg Intravenous Q12H  . multivitamin with minerals  1 tablet Oral Daily  . nicotine  21 mg Transdermal Daily  . sodium chloride  3 mL Intravenous Q12H  . thiamine  100 mg Oral Daily   Or  . thiamine  100 mg Intravenous Daily  . cyanocobalamin  1,000 mcg Oral Daily   Dg Chest 2 View  11/27/2014   CLINICAL DATA:  Ischemic toes and elevated white blood cell count.  EXAM: CHEST  2 VIEW  COMPARISON:  09/26/2004  FINDINGS: The cardiac silhouette, mediastinal and hilar contours are within normal limits and stable. There is mild tortuosity of the thoracic aorta. The lungs demonstrate mild stable emphysematous changes and hyperinflation. Bilateral nipple shadows are noted. No worrisome lesions. No acute pulmonary findings or pleural effusion. Remote healed bilateral rib fractures are noted.  IMPRESSION: Emphysematous changes but no acute pulmonary findings.   Electronically Signed   By: Marijo Sanes M.D.   On: 11/27/2014 11:12   US Renal  11/27/2014   CLINICAL DATA:  Acute renal failure  EXAM: RENAL / URINARY TRACT ULTRASOUND COMPLETE  COMPARISON:  None.  FINDINGS: Right Kidney:  Length: 10.1 cm. Echogenicity within normal limits. No mass or hydronephrosis visualized.  Left Kidney:  Length: 9.9 cm. Echogenicity within normal limits. No  mass or hydronephrosis visualized. A few small echogenic foci are identified which may represent small stones. No obstructive changes are noted.  Bladder:  Decompressed by a Foley catheter  IMPRESSION: Small echogenic foci within the left kidney which may represent small nonobstructing stones.  No other focal abnormality is noted.   Electronically Signed   By: Inez Catalina M.D.   On: 11/27/2014 20:48   Dg Foot Complete Left  11/26/2014   CLINICAL DATA:  Left second toe coldness and black tennis  EXAM: LEFT FOOT - COMPLETE 3+ VIEW  COMPARISON:  None.  FINDINGS: Tarsal-metatarsal alignment is normal. No evidence of osteomyelitis is seen. No or definite erosion is seen. However, a small erosion involving the base of the proximal phalanx of the left great toe cannot be excluded on the oblique view. Is there any history of gout?  IMPRESSION: No evidence of osteomyelitis. Difficult to exclude a small erosion involving the base of the proximal phalanx of the left great toe.   Electronically Signed   By: Ivar Drape M.D.   On: 11/26/2014 13:17   BMET    Component Value Date/Time   NA 138 11/28/2014 0249   K 3.4* 11/28/2014 0249   CL 105 11/28/2014 0249   CO2 22 11/28/2014 0249   GLUCOSE 91 11/28/2014 0249   BUN 21* 11/28/2014 0249   CREATININE 6.91* 11/28/2014  0249   CREATININE 5.27* 11/26/2014 1308   CALCIUM 7.6* 11/28/2014 0249   GFRNONAA 7* 11/28/2014 0249   GFRNONAA 10* 11/26/2014 1308   GFRAA 8* 11/28/2014 0249   GFRAA 12* 11/26/2014 1308   CBC    Component Value Date/Time   WBC 13.4* 11/28/2014 0249   WBC 11.7* 11/26/2014 1311   RBC 3.25* 11/28/2014 0249   RBC 3.61* 11/26/2014 1311   HGB 10.3* 11/28/2014 0249   HGB 10.9* 11/26/2014 1311   HCT 30.6* 11/28/2014 0249   HCT 34.7* 11/26/2014 1311   PLT 318 11/28/2014 0249   MCV 94.2 11/28/2014 0249   MCV 96.1 11/26/2014 1311   MCH 31.7 11/28/2014 0249   MCH 30.3 11/26/2014 1311   MCHC 33.7 11/28/2014 0249   MCHC 31.5* 11/26/2014 1311    RDW 17.8* 11/28/2014 0249   LYMPHSABS 1.3 11/27/2014 1012   MONOABS 1.0 11/27/2014 1012   EOSABS 0.2 11/27/2014 1012   BASOSABS 0.0 11/27/2014 1012     Assessment:  1. Acute/Subacute renal failure  (Scr 11/05/14 .7)  Suspect due to ATN from hypotension and volume depletion in face of gangrenous toe.  Scr sl lower 2. 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 ETOH abuse 4. Probable underlying dementia  Plan: 1. Cont IV fluids 2. Daily Scr 3. SPEP pend   Leon Foster

## 2014-11-28 NOTE — Progress Notes (Signed)
Pt frequently redirected and reoriented. Pt continues to pull all monitor leads and pulse oximeter off. Pt continues to state that he does not need all "this stuff" and he needs to go. Mittens placed on pt to prevent pt from pulling out tubes and lines.

## 2014-11-28 NOTE — Progress Notes (Signed)
Pt becoming increasingly confused. Pt having incontinent bowel movements and throwing the bed pad on the floor. Pt educated to call staff when he needs to go to the bathroom. Pt alert to self only. Pt states that he needs to get out of bed and go home. Pt attempting to take pulse oximeter off and pull at foley catheter, stating that he wants to take it off and go to the bathroom. Explained to the pt that he was in the hospital and that he needed to leave the wires connected so that hospital staff could know his oxygen level and urine output. No evidence of understanding noted. Will continue to monitor pt.

## 2014-11-29 ENCOUNTER — Other Ambulatory Visit (HOSPITAL_COMMUNITY): Payer: Medicare Other

## 2014-11-29 DIAGNOSIS — R410 Disorientation, unspecified: Secondary | ICD-10-CM | POA: Diagnosis present

## 2014-11-29 DIAGNOSIS — IMO0001 Reserved for inherently not codable concepts without codable children: Secondary | ICD-10-CM | POA: Diagnosis present

## 2014-11-29 DIAGNOSIS — A419 Sepsis, unspecified organism: Secondary | ICD-10-CM

## 2014-11-29 DIAGNOSIS — I998 Other disorder of circulatory system: Secondary | ICD-10-CM

## 2014-11-29 LAB — CBC WITH DIFFERENTIAL/PLATELET
BASOS ABS: 0.1 10*3/uL (ref 0.0–0.1)
Basophils Relative: 0 % (ref 0–1)
EOS PCT: 2 % (ref 0–5)
Eosinophils Absolute: 0.3 10*3/uL (ref 0.0–0.7)
HEMATOCRIT: 30.2 % — AB (ref 39.0–52.0)
Hemoglobin: 10 g/dL — ABNORMAL LOW (ref 13.0–17.0)
LYMPHS ABS: 1.6 10*3/uL (ref 0.7–4.0)
Lymphocytes Relative: 11 % — ABNORMAL LOW (ref 12–46)
MCH: 31.9 pg (ref 26.0–34.0)
MCHC: 33.1 g/dL (ref 30.0–36.0)
MCV: 96.5 fL (ref 78.0–100.0)
Monocytes Absolute: 1.7 10*3/uL — ABNORMAL HIGH (ref 0.1–1.0)
Monocytes Relative: 12 % (ref 3–12)
NEUTROS PCT: 75 % (ref 43–77)
Neutro Abs: 10.5 10*3/uL — ABNORMAL HIGH (ref 1.7–7.7)
Platelets: 317 10*3/uL (ref 150–400)
RBC: 3.13 MIL/uL — AB (ref 4.22–5.81)
RDW: 18.1 % — AB (ref 11.5–15.5)
WBC: 14.2 10*3/uL — AB (ref 4.0–10.5)

## 2014-11-29 LAB — KAPPA/LAMBDA LIGHT CHAINS
Kappa free light chain: 148.63 mg/L — ABNORMAL HIGH (ref 3.30–19.40)
Kappa, lambda light chain ratio: 2.26 — ABNORMAL HIGH (ref 0.26–1.65)
Lambda free light chains: 65.83 mg/L — ABNORMAL HIGH (ref 5.71–26.30)

## 2014-11-29 LAB — COMPREHENSIVE METABOLIC PANEL
ALK PHOS: 61 U/L (ref 38–126)
ALT: 6 U/L — ABNORMAL LOW (ref 17–63)
ANION GAP: 10 (ref 5–15)
AST: 15 U/L (ref 15–41)
Albumin: 1.8 g/dL — ABNORMAL LOW (ref 3.5–5.0)
BUN: 21 mg/dL — ABNORMAL HIGH (ref 6–20)
CALCIUM: 7.7 mg/dL — AB (ref 8.9–10.3)
CO2: 20 mmol/L — ABNORMAL LOW (ref 22–32)
Chloride: 109 mmol/L (ref 101–111)
Creatinine, Ser: 6.95 mg/dL — ABNORMAL HIGH (ref 0.61–1.24)
GFR calc Af Amer: 8 mL/min — ABNORMAL LOW (ref >60)
GFR, EST NON AFRICAN AMERICAN: 7 mL/min — AB (ref >60)
GLUCOSE: 84 mg/dL (ref 65–99)
POTASSIUM: 4.2 mmol/L (ref 3.5–5.1)
Sodium: 139 mmol/L (ref 135–145)
Total Bilirubin: 0.5 mg/dL (ref 0.3–1.2)
Total Protein: 5.6 g/dL — ABNORMAL LOW (ref 6.5–8.1)

## 2014-11-29 LAB — PHOSPHORUS: PHOSPHORUS: 4.5 mg/dL (ref 2.5–4.6)

## 2014-11-29 LAB — URINE CULTURE: Culture: NO GROWTH

## 2014-11-29 LAB — LACTIC ACID, PLASMA: LACTIC ACID, VENOUS: 0.9 mmol/L (ref 0.5–2.0)

## 2014-11-29 LAB — MAGNESIUM: MAGNESIUM: 2 mg/dL (ref 1.7–2.4)

## 2014-11-29 MED ORDER — FOLIC ACID 1 MG PO TABS
1.0000 mg | ORAL_TABLET | Freq: Every day | ORAL | Status: DC
  Administered 2014-11-30 – 2014-12-15 (×16): 1 mg via ORAL
  Filled 2014-11-29 (×16): qty 1

## 2014-11-29 MED ORDER — SODIUM CHLORIDE 0.9 % IV SOLN: INTRAVENOUS | Status: AC

## 2014-11-29 NOTE — Progress Notes (Signed)
[place] TEAM 1 - Stepdown/ICU TEAM Progress Note  Leon Foster VHQ:[id number] DOB: [birth year] DOA: 11/27/2014 PCP: Philis Fendt, MD  Admit HPI / Brief Narrative: 74 y.o. BM single, lives alone, PMHx of heavy alcohol tobacco abuse since age 34 (,smokes 1ppd. He reports drinking two bottles of wine )  Evaluated at urgent care center and by vascular surgery/Dr. Ruta Hinds on 11/26/14 for gangrenous left second toe and labs which were drawn yesterday showed acute renal failure with creatinine 5.27 and family was advised to bring patient to [facility] ED. Patient is unable to provide any history secondary to confusion. History is obtained from patient's brother and 2 sisters at bedside. According to them, patient has been complaining of left foot pain for approximately a year which progressively got worse in the last 2 weeks. One of the sisters looked at his foot for the first time 3 nights ago and noticed black discoloration of second left toe. He was taken to urgent care center yesterday and then evaluated by vascular surgery who planned for an aortogram and possible intervention on 11/30/14. At this time patient is not complaining of foot pain. He does not know why he is in the hospital. According to family, he has intermittent urinary and fecal incontinence. He apparently had some "shakes" yesterday. Apart from this, family denies any other complaints. No reported chest pain, dyspnea, cough, fever, chills, nausea or vomiting. In the ED, repeat labs show creatinine of 7.20 (normal creatinine of 1.06 on 08/20/14), lactate 2.06, normal ammonia and INR, hemoglobin 11.6 WBC 12.6, chest x-ray with emphysema but no acute findings and left foot x-ray shows no evidence of osteomyelitis. Patient was empirically started on IV vancomycin and Zosyn for presumed cellulitis of left toe/foot. Hospitalist admission was requested.    HPI/Subjective: 8/14 patient sleepy but arousable. A/O 2, (does not know when/where,  understands he is in the hospital), follows all commands.    Assessment/Plan: Dry gangrene left second metatarsal/sepsis unspecified organism? -Per vascular surgeryAortogram with bilateral runoff and possible intervention with Dr. Oneida Alar on Monday 11/30/14. Discussed that if he has extensive disease not amenable to percutaneous intervention, may require bypass or AKA   vs BKA  -Blood cultures pending -Urine cultures pending  Acute renal failure(Normal Cr 1.06 on 08/20/14). -Admitting creatinine 7.2 on 11/27/14.  -8/14  Cr = 6.95  -Family understands that patient may wind up on hemodialysis. -Continue aggressive hydration normal saline 125 ml/hr -Strict I&O since admission  +4.0L -Daily a.m. weight  -Renal ultrasound; no obstruction see results below -Patient with continued poor output; nephrology will decide if/when HD required -Avoid nephrotoxic medication  Dehydration -Continue aggressive IV fluids normal saline 75 mL/hr; until we determine cardiac status  Cardiomyopathy? -Patient is an alcoholic and heavy smoker which predisposes toward alcoholic cardiomyopathy. Obtain echocardiogram to assess cardiac status -Echocardiogram pending  Hypokalemia -Potassium IV 40 mEq 1  Tobacco abuse:  -Continue nicotine patch  Alcohol abuse -continue CIWA protocolCOPD: Stable.  Altered mental status  -Most likely secondary to alcohol abuse     Code Status: FULL Family Communication: family present at time of exam Disposition Plan: Vascular surgery    Consultants: Dr.Michael Mercy Moore (nephrology) Dr.Davell E Fields (vascular surgery)   Procedure/Significant Events: 8/12 renal ultrasound;Small echogenic foci w/i left kidney small nonobstructing stones.   Culture 8/12 urine negative 8/12 MRSA by PCR negative 8/13 urine pending 8/13 blood culture 2 pending   Antibiotics: Zosyn 8/12>> Vancomycin 8/12>  DVT prophylaxis: Heparin   Devices  LINES / TUBES:       Continuous Infusions: . sodium chloride 75 mL/hr at 11/28/14 2005    Objective: VITAL SIGNS: Temp: 98.1 F (36.7 C) (08/14 0809) Temp Source: Oral (08/14 0809) BP: 150/87 mmHg (08/14 0455) Pulse Rate: 99 (08/14 0455) SPO2; FIO2:   Intake/Output Summary (Last 24 hours) at 11/29/14 1046 Last data filed at 11/29/14 0944  Gross per 24 hour  Intake   3235 ml  Output    950 ml  Net   2285 ml     Exam: General: sleepy but arousable. A/O 2, (does not know when/where, understands he is in the hospital),. cachectic, unkempt, No acute respiratory distress Eyes: negative scleral hemorrhage ENT: Negative Runny nose, negative gingival bleeding, poor dentation. Neck:  Negative scars, masses, torticollis, lymphadenopathy, JVD Lungs: Clear to auscultation bilaterally without wheezes or crackles Cardiovascular: Regular rhythm and rate without murmur gallop or rub normal S1 and S2 Abdomen:negative abdominal pain, negative dysphagia, nondistended, positive soft, bowel sounds, no rebound, no ascites, no appreciable mass Extremities: No significant cyanosis, clubbing, or edema on the RLE. However LLE DP/PT pulse not palpable, foot cool to touch, metatarsals 1 through 4 dusky, painful to the touch; the worst is first and second metatarsal followed by the fifth metatarsal. Psychiatric: Unable to assess      Data Reviewed: Basic Metabolic Panel:  Recent Labs Lab 11/26/14 1308 11/27/14 1000 11/27/14 1012 11/28/14 0249 11/28/14 1529 11/29/14 0329  NA 138  --  138 138 140 139  K 3.5  --  3.0* 3.4* 3.3* 4.2  CL 100  --  101 105 108 109  CO2 25  --  22 22 20* 20*  GLUCOSE 115*  --  110* 91 121* 84  BUN 20  --  21* 21* 21* 21*  CREATININE 5.27*  --  7.20* 6.91* 6.88* 6.95*  CALCIUM 7.8*  --  7.9* 7.6* 6.9* 7.7*  MG  --  1.5*  --   --   --  2.0  PHOS  --   --   --  4.4 3.9 4.5   Liver Function Tests:  Recent Labs Lab 11/26/14 1308 11/27/14 1012 11/28/14 0249  11/28/14 1529 11/29/14 0329  AST 9* 17  --   --  15  ALT 3* 5*  --   --  6*  ALKPHOS 67 69  --   --  61  BILITOT 0.3 0.6  --   --  0.5  PROT 6.1 6.5  --   --  5.6*  ALBUMIN 2.5* 2.4* 2.1* 1.9* 1.8*   No results for input(s): LIPASE, AMYLASE in the last 168 hours.  Recent Labs Lab 11/27/14 1043  AMMONIA 26   CBC:  Recent Labs Lab 11/26/14 1311 11/27/14 1012 11/28/14 0249 11/28/14 1529 11/29/14 0329  WBC 11.7* 12.6* 13.4* 14.3* 14.2*  NEUTROABS  --  10.0*  --   --  10.5*  HGB 10.9* 11.6* 10.3* 10.6* 10.0*  HCT 34.7* 34.5* 30.6* 31.2* 30.2*  MCV 96.1 96.1 94.2 95.7 96.5  PLT  --  322 318 356 317   Cardiac Enzymes: No results for input(s): CKTOTAL, CKMB, CKMBINDEX, TROPONINI in the last 168 hours. BNP (last 3 results) No results for input(s): BNP in the last 8760 hours.  ProBNP (last 3 results) No results for input(s): PROBNP in the last 8760 hours.  CBG: No results for input(s): GLUCAP in the last 168 hours.  Recent Results (from the past 240 hour(s))  Urine culture  Status: None   Collection Time: 11/27/14 12:24 PM  Result Value Ref Range Status   Specimen Description URINE, RANDOM  Final   Special Requests NONE  Final   Culture NO GROWTH 1 DAY  Final   Report Status 11/28/2014 FINAL  Final  MRSA PCR Screening     Status: None   Collection Time: 11/27/14  5:27 PM  Result Value Ref Range Status   MRSA by PCR NEGATIVE NEGATIVE Final    Comment:        The GeneXpert MRSA Assay (FDA approved for NASAL specimens only), is one component of a comprehensive MRSA colonization surveillance program. It is not intended to diagnose MRSA infection nor to guide or monitor treatment for MRSA infections.   C difficile quick scan w PCR reflex     Status: None   Collection Time: 11/28/14  2:31 PM  Result Value Ref Range Status   C Diff antigen NEGATIVE NEGATIVE Final   C Diff toxin NEGATIVE NEGATIVE Final   C Diff interpretation Negative for toxigenic C.  difficile  Final     Studies:  Recent x-ray studies have been reviewed in detail by the Attending Physician  Scheduled Meds:  Scheduled Meds: . cyanocobalamin  500 mcg Oral Daily  . enoxaparin (LOVENOX) injection  30 mg Subcutaneous Q24H  . folic acid  1 mg Intravenous Daily  . multivitamin with minerals  1 tablet Oral Daily  . nicotine  21 mg Transdermal Daily  . sodium chloride  3 mL Intravenous Q12H  . thiamine  100 mg Oral Daily   Or  . thiamine  100 mg Intravenous Daily  . cyanocobalamin  1,000 mcg Oral Daily    Time spent on care of this patient: 40 mins   Fabrizio Filip, Geraldo Docker , MD  [facility] Office  [phone] Pager - [phone]  On-Call/Text Page:      [email or web address]      password [email or web address]  If 7PM-7AM, please contact night-coverage [email or web address] Password [email or web address] 11/29/2014, 10:46 AM   LOS: 2 days   Care during the described time interval was provided by me .  I have reviewed this patient's available data, including medical history, events of note, physical examination, and all test results as part of my evaluation. I have personally reviewed and interpreted all radiology studies.   Dia Crawford, MD [phone] Pager

## 2014-11-29 NOTE — Progress Notes (Signed)
S: Denies Pain O:BP 150/87 mmHg  Pulse 99  Temp(Src) 97 F (36.1 C) (Oral)  Resp 12  Ht 5\' 7"  (1.702 m)  Wt 54.5 kg (120 lb 2.4 oz)  BMI 18.81 kg/m2  SpO2 100%  Intake/Output Summary (Last 24 hours) at 11/29/14 0744 Last data filed at 11/29/14 9622  Gross per 24 hour  Intake   3115 ml  Output   1075 ml  Net   2040 ml   Weight change: 0.6 kg (1 lb 5.2 oz) WLN:LGXQJ and alert CVS:RRR Resp:clear Abd:+ BS NTND Ext:no edema NEURO:Ox1, No asterixis   . cyanocobalamin  500 mcg Oral Daily  . enoxaparin (LOVENOX) injection  30 mg Subcutaneous Q24H  . folic acid  1 mg Intravenous Daily  . multivitamin with minerals  1 tablet Oral Daily  . nicotine  21 mg Transdermal Daily  . sodium chloride  3 mL Intravenous Q12H  . thiamine  100 mg Oral Daily   Or  . thiamine  100 mg Intravenous Daily  . cyanocobalamin  1,000 mcg Oral Daily   Dg Chest 2 View  11/27/2014   CLINICAL DATA:  Ischemic toes and elevated white blood cell count.  EXAM: CHEST  2 VIEW  COMPARISON:  09/26/2004  FINDINGS: The cardiac silhouette, mediastinal and hilar contours are within normal limits and stable. There is mild tortuosity of the thoracic aorta. The lungs demonstrate mild stable emphysematous changes and hyperinflation. Bilateral nipple shadows are noted. No worrisome lesions. No acute pulmonary findings or pleural effusion. Remote healed bilateral rib fractures are noted.  IMPRESSION: Emphysematous changes but no acute pulmonary findings.   Electronically Signed   By: Marijo Sanes M.D.   On: 11/27/2014 11:12   US Renal  11/27/2014   CLINICAL DATA:  Acute renal failure  EXAM: RENAL / URINARY TRACT ULTRASOUND COMPLETE  COMPARISON:  None.  FINDINGS: Right Kidney:  Length: 10.1 cm. Echogenicity within normal limits. No mass or hydronephrosis visualized.  Left Kidney:  Length: 9.9 cm. Echogenicity within normal limits. No mass or hydronephrosis visualized. A few small echogenic foci are identified which may represent  small stones. No obstructive changes are noted.  Bladder:  Decompressed by a Foley catheter  IMPRESSION: Small echogenic foci within the left kidney which may represent small nonobstructing stones.  No other focal abnormality is noted.   Electronically Signed   By: Inez Catalina M.D.   On: 11/27/2014 20:48   BMET    Component Value Date/Time   NA 139 11/29/2014 0329   K 4.2 11/29/2014 0329   CL 109 11/29/2014 0329   CO2 20* 11/29/2014 0329   GLUCOSE 84 11/29/2014 0329   BUN 21* 11/29/2014 0329   CREATININE 6.95* 11/29/2014 0329   CREATININE 5.27* 11/26/2014 1308   CALCIUM 7.7* 11/29/2014 0329   GFRNONAA 7* 11/29/2014 0329   GFRNONAA 10* 11/26/2014 1308   GFRAA 8* 11/29/2014 0329   GFRAA 12* 11/26/2014 1308   CBC    Component Value Date/Time   WBC 14.2* 11/29/2014 0329   WBC 11.7* 11/26/2014 1311   RBC 3.13* 11/29/2014 0329   RBC 3.61* 11/26/2014 1311   HGB 10.0* 11/29/2014 0329   HGB 10.9* 11/26/2014 1311   HCT 30.2* 11/29/2014 0329   HCT 34.7* 11/26/2014 1311   PLT 317 11/29/2014 0329   MCV 96.5 11/29/2014 0329   MCV 96.1 11/26/2014 1311   MCH 31.9 11/29/2014 0329   MCH 30.3 11/26/2014 1311   MCHC 33.1 11/29/2014 0329   MCHC 31.5*  11/26/2014 1311   RDW 18.1* 11/29/2014 0329   LYMPHSABS 1.6 11/29/2014 0329   MONOABS 1.7* 11/29/2014 0329   EOSABS 0.3 11/29/2014 0329   BASOSABS 0.1 11/29/2014 0329     Assessment:  1. Acute/Subacute renal failure  (Scr 11/05/14 .7)  Suspect due to ATN from hypotension and volume depletion in face of gangrenous toe.  Scr about the same, UO improving 2. 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 ETOH abuse 4. Probable underlying dementia  Plan: 1. Cont IV fluids 2. Daily SCr.  Since I know his renal fx was normal 2 weeks ago I anticipate renal fx to recover.  No need for HD yet.  Leon Foster

## 2014-11-29 NOTE — Consult Note (Signed)
Pt with ischemic left 2nd toe.  Currently no progression since seen 3 days ago.  Currently in renal failure.  Mental status much improved.  Creatinine still elevated.  Filed Vitals:   11/28/14 2014 11/28/14 2352 11/29/14 0455 11/29/14 0809  BP: 137/84  150/87   Pulse: 97 99 99   Temp: 98.8 F (37.1 C) 98.5 F (36.9 C) 97 F (36.1 C) 98.1 F (36.7 C)  TempSrc: Oral Oral Oral Oral  Resp: 14 17 12    Height:      Weight:   120 lb 2.4 oz (54.5 kg)   SpO2: 100%      CBC    Component Value Date/Time   WBC 14.2* 11/29/2014 0329   WBC 11.7* 11/26/2014 1311   RBC 3.13* 11/29/2014 0329   RBC 3.61* 11/26/2014 1311   HGB 10.0* 11/29/2014 0329   HGB 10.9* 11/26/2014 1311   HCT 30.2* 11/29/2014 0329   HCT 34.7* 11/26/2014 1311   PLT 317 11/29/2014 0329   MCV 96.5 11/29/2014 0329   MCV 96.1 11/26/2014 1311   MCH 31.9 11/29/2014 0329   MCH 30.3 11/26/2014 1311   MCHC 33.1 11/29/2014 0329   MCHC 31.5* 11/26/2014 1311   RDW 18.1* 11/29/2014 0329   LYMPHSABS 1.6 11/29/2014 0329   MONOABS 1.7* 11/29/2014 0329   EOSABS 0.3 11/29/2014 0329   BASOSABS 0.1 11/29/2014 0329     BMET    Component Value Date/Time   NA 139 11/29/2014 0329   K 4.2 11/29/2014 0329   CL 109 11/29/2014 0329   CO2 20* 11/29/2014 0329   GLUCOSE 84 11/29/2014 0329   BUN 21* 11/29/2014 0329   CREATININE 6.95* 11/29/2014 0329   CREATININE 5.27* 11/26/2014 1308   CALCIUM 7.7* 11/29/2014 0329   GFRNONAA 7* 11/29/2014 0329   GFRNONAA 10* 11/26/2014 1308   GFRAA 8* 11/29/2014 0329   GFRAA 12* 11/26/2014 1308      Will defer arteriogram for now until overall condition improved and renal function stabilized.  If has progression in his current state would need BKA/AKA but hopefully foot will hold on until he improves overall.  Will recheck later in the week  Ruta Hinds, MD [facility]: [phone] Pager: [phone]

## 2014-11-30 ENCOUNTER — Inpatient Hospital Stay (HOSPITAL_COMMUNITY): Payer: Medicare Other

## 2014-11-30 ENCOUNTER — Encounter (HOSPITAL_COMMUNITY)
Admission: EM | Disposition: A | Discharge status: Skilled Nursing Facility | Payer: Self-pay | Source: Home / Self Care | Attending: Family Medicine

## 2014-11-30 ENCOUNTER — Ambulatory Visit (HOSPITAL_COMMUNITY): Admission: RE | Admit: 2014-11-30 | Payer: Medicare Other | Source: Ambulatory Visit | Admitting: Vascular Surgery

## 2014-11-30 DIAGNOSIS — Z01810 Encounter for preprocedural cardiovascular examination: Secondary | ICD-10-CM

## 2014-11-30 LAB — CBC WITH DIFFERENTIAL/PLATELET
BASOS PCT: 0 % (ref 0–1)
Basophils Absolute: 0.1 10*3/uL (ref 0.0–0.1)
EOS ABS: 0.3 10*3/uL (ref 0.0–0.7)
EOS PCT: 3 % (ref 0–5)
HCT: 28.7 % — ABNORMAL LOW (ref 39.0–52.0)
HEMOGLOBIN: 9.7 g/dL — AB (ref 13.0–17.0)
Lymphocytes Relative: 12 % (ref 12–46)
Lymphs Abs: 1.4 10*3/uL (ref 0.7–4.0)
MCH: 32.2 pg (ref 26.0–34.0)
MCHC: 33.8 g/dL (ref 30.0–36.0)
MCV: 95.3 fL (ref 78.0–100.0)
MONOS PCT: 13 % — AB (ref 3–12)
Monocytes Absolute: 1.5 10*3/uL — ABNORMAL HIGH (ref 0.1–1.0)
NEUTROS PCT: 72 % (ref 43–77)
Neutro Abs: 8.7 10*3/uL — ABNORMAL HIGH (ref 1.7–7.7)
PLATELETS: 379 10*3/uL (ref 150–400)
RBC: 3.01 MIL/uL — AB (ref 4.22–5.81)
RDW: 17.9 % — ABNORMAL HIGH (ref 11.5–15.5)
WBC: 12 10*3/uL — AB (ref 4.0–10.5)

## 2014-11-30 LAB — PROTEIN ELECTROPHORESIS, SERUM
A/G Ratio: 0.6 — ABNORMAL LOW (ref 0.7–1.7)
ALPHA-2-GLOBULIN: 1.1 g/dL — AB (ref 0.4–1.0)
Albumin ELP: 2.2 g/dL — ABNORMAL LOW (ref 2.9–4.4)
Alpha-1-Globulin: 0.3 g/dL (ref 0.0–0.4)
BETA GLOBULIN: 1 g/dL (ref 0.7–1.3)
Gamma Globulin: 1.1 g/dL (ref 0.4–1.8)
Globulin, Total: 3.5 g/dL (ref 2.2–3.9)
Total Protein ELP: 5.7 g/dL — ABNORMAL LOW (ref 6.0–8.5)

## 2014-11-30 LAB — COMPREHENSIVE METABOLIC PANEL
ALBUMIN: 1.7 g/dL — AB (ref 3.5–5.0)
ALK PHOS: 55 U/L (ref 38–126)
ALT: 6 U/L — ABNORMAL LOW (ref 17–63)
ANION GAP: 9 (ref 5–15)
AST: 16 U/L (ref 15–41)
BUN: 22 mg/dL — ABNORMAL HIGH (ref 6–20)
CALCIUM: 7.7 mg/dL — AB (ref 8.9–10.3)
CHLORIDE: 114 mmol/L — AB (ref 101–111)
CO2: 18 mmol/L — AB (ref 22–32)
Creatinine, Ser: 7.02 mg/dL — ABNORMAL HIGH (ref 0.61–1.24)
GFR calc non Af Amer: 7 mL/min — ABNORMAL LOW (ref >60)
GFR, EST AFRICAN AMERICAN: 8 mL/min — AB (ref >60)
GLUCOSE: 95 mg/dL (ref 65–99)
Potassium: 3.7 mmol/L (ref 3.5–5.1)
SODIUM: 141 mmol/L (ref 135–145)
Total Bilirubin: 0.3 mg/dL (ref 0.3–1.2)
Total Protein: 5.6 g/dL — ABNORMAL LOW (ref 6.5–8.1)

## 2014-11-30 LAB — MAGNESIUM: Magnesium: 1.9 mg/dL (ref 1.7–2.4)

## 2014-11-30 SURGERY — ABDOMINAL AORTOGRAM
Anesthesia: LOCAL

## 2014-11-30 MED ORDER — NEPRO/CARBSTEADY PO LIQD
237.0000 mL | Freq: Three times a day (TID) | ORAL | Status: DC
  Administered 2014-11-30 – 2014-12-13 (×24): 237 mL via ORAL
  Filled 2014-11-30 (×6): qty 237

## 2014-11-30 MED ORDER — SODIUM BICARBONATE 650 MG PO TABS
650.0000 mg | ORAL_TABLET | Freq: Three times a day (TID) | ORAL | Status: DC
  Administered 2014-11-30 – 2014-12-15 (×44): 650 mg via ORAL
  Filled 2014-11-30 (×47): qty 1

## 2014-11-30 NOTE — Progress Notes (Signed)
Assessment:  1. Acute/Subacute renal failure (Scr 11/05/14 .7) Suspect due to ATN from hypotension and volume depletion in face of gangrenous toe. Scr about the same, UO improving 2. 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 ETOH abuse 4. Probable underlying dementia  Plan: 1. Stop  IV fluids, add bicarb 2. Daily SCr. Since I know his renal fx was normal 2 weeks ago I anticipate renal fx to recover. No need for HD yet.  Subjective: Interval History: No change  Objective: Vital signs in last 24 hours: Temp:  [97.8 F (36.6 C)-98.6 F (37 C)] 97.8 F (36.6 C) (08/15 0752) Pulse Rate:  [93-103] 98 (08/15 0752) Resp:  [12-17] 12 (08/15 0752) BP: (139-166)/(81-91) 148/82 mmHg (08/15 0757) SpO2:  [99 %-100 %] 100 % (08/15 0752) Weight:  [59.1 kg (130 lb 4.7 oz)] 59.1 kg (130 lb 4.7 oz) (08/15 0407) Weight change: 4.6 kg (10 lb 2.3 oz)  Intake/Output from previous day: 08/14 0701 - 08/15 0700 In: 2711.8 [P.O.:240; I.V.:2471.8] Out: 950 [Urine:950] Intake/Output this shift: Total I/O In: 345 [P.O.:120; I.V.:225] Out: -   General appearance: alert and cooperative Resp: clear to auscultation bilaterally Chest wall: no tenderness Cardio: regular rate and rhythm, S1, S2 normal, no murmur, click, rub or gallop GI: soft, non-tender; bowel sounds normal; no masses,  no organomegaly Extremities: hyperpigmented swollen digits tender on L  Lab Results:  Recent Labs  11/29/14 0329 11/30/14 0305  WBC 14.2* 12.0*  HGB 10.0* 9.7*  HCT 30.2* 28.7*  PLT 317 379   BMET:  Recent Labs  11/29/14 0329 11/30/14 0305  NA 139 141  K 4.2 3.7  CL 109 114*  CO2 20* 18*  GLUCOSE 84 95  BUN 21* 22*  CREATININE 6.95* 7.02*  CALCIUM 7.7* 7.7*   No results for input(s): PTH in the last 72 hours. Iron Studies: No results for input(s): IRON, TIBC, TRANSFERRIN, FERRITIN in the last 72 hours. Studies/Results: No results found.  Scheduled: . cyanocobalamin  500 mcg Oral Daily  . enoxaparin  (LOVENOX) injection  30 mg Subcutaneous Q24H  . folic acid  1 mg Oral Daily  . multivitamin with minerals  1 tablet Oral Daily  . nicotine  21 mg Transdermal Daily  . sodium chloride  3 mL Intravenous Q12H  . thiamine  100 mg Oral Daily  . cyanocobalamin  1,000 mcg Oral Daily    LOS: 3 days   Ranesha Val C 11/30/2014,12:38 PM

## 2014-11-30 NOTE — Progress Notes (Signed)
[place] TEAM 1 - Stepdown/ICU TEAM Progress Note  RONDALE NIES QVZ:[id number] DOB: 01-09-1941 DOA: 11/27/2014 PCP: Philis Fendt, MD  Admit HPI / Brief Narrative: 74 y.o. M who lives alone w/ a Hx of heavy alcohol (2 bottles of wine/day) and tobacco abuse (1ppd since age 18) who was evaluated by vascular surgery/Dr. Ruta Hinds on 11/26/14.  During his w/u labs showed creatinine 5.27 and family was advised to bring patient to [facility] ED. According to family patient had been complaining of left foot pain for approximately a year which progressively got worse over the preceding 2 weeks. One of his sisters looked at his foot and noticed black discoloration of the second left toe. He was taken to urgent care center and then evaluated by vascular surgery who planned for an aortogram and possible intervention on 11/30/14.   In the ED, repeat labs noted creatinine of 7.20 (normal creatinine of 1.06 on 08/20/14), lactate 2.06, normal ammonia and INR, hemoglobin 11.6 WBC 12.6, chest x-ray with emphysema but no acute findings and left foot x-ray w/ no evidence of osteomyelitis. Patient was empirically started on IV vancomycin and Zosyn for presumed cellulitis of left toe/foot.   HPI/Subjective: The pt is alert but denies any complaints whatsoever.  He denies sob, cp, n/v, or abdom pain.  He appears to be confused, and his hx is not felt to be reliable.    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 -Renal ultrasound w/o obstruction  -Nephrology  following  Dehydration -Continue IV support until volume restored   Cardiomyopathy? -Patient is an alcoholic and heavy smoker which predisposes toward alcoholic cardiomyopathy -TTE pending  Hypokalemia -follow trend - in setting of AKD will not replace unless K+ < 3.0  Tobacco abuse -Continue nicotine patch  Alcohol abuse -continue CIWA protocol  COPD -compensated at present   Altered mental status  -Most likely secondary to alcohol abuse - follow clinically - check P29 and folic acid   Code Status: FULL Family Communication: spoke w/ 2 sisters and a brother at bedside  Disposition Plan: stable for transfer to renal floor - cont to follow renal fxn - await Vas Surgery determination   Consultants: Dr.Michael Mercy Moore (nephrology) Dr.Glenford E Fields (vascular surgery)  Procedure/Significant Events: 8/12 renal ultrasound Small echogenic foci w/i left kidney small nonobstructing stones  Antibiotics: Zosyn 8/12  Vancomycin 8/12   DVT prophylaxis: lovenox  Objective: Blood pressure 148/82, pulse 98, temperature 97.8 F (36.6 C), temperature source Oral, resp. rate 12, height 5\' 7"  (1.702 m), weight 59.1 kg (130 lb 4.7 oz), SpO2 100 %.  Intake/Output Summary (Last 24 hours) at 11/30/14 1107 Last data filed at 11/30/14 1000  Gross per 24 hour  Intake   1893 ml  Output    950 ml  Net    943 ml   Exam: General: No acute respiratory distress - alert but slow to speak  Lungs: Clear to auscultation bilaterally without wheezes or crackles Cardiovascular: Regular rate and rhythm without murmur gallop or rub normal S1 and S2 Abdomen: Nontender, nondistended, soft, bowel sounds positive, no  rebound, no ascites, no appreciable mass Extremities: No significant clubbing, or edema bilateral lower extremities - L 2nd toe black and appears non-vital - L great toe darker than other toes - no purulent d/c - no erythema - no clinical evidence of cellulitis   Data Reviewed: Basic  Metabolic Panel:  Recent Labs Lab 11/27/14 1000 11/27/14 1012 11/28/14 0249 11/28/14 1529 11/29/14 0329 11/30/14 0305  NA  --  138 138 140 139 141  K  --  3.0* 3.4* 3.3* 4.2 3.7  CL  --  101 105 108 109 114*  CO2  --  22 22 20* 20* 18*  GLUCOSE  --  110* 91 121* 84 95  BUN  --  21* 21* 21* 21* 22*  CREATININE  --  7.20* 6.91* 6.88* 6.95* 7.02*  CALCIUM  --  7.9* 7.6* 6.9* 7.7* 7.7*  MG 1.5*  --   --   --  2.0 1.9  PHOS  --   --  4.4 3.9 4.5  --    Liver Function Tests:  Recent Labs Lab 11/26/14 1308 11/27/14 1012 11/28/14 0249 11/28/14 1529 11/29/14 0329 11/30/14 0305  AST 9* 17  --   --  15 16  ALT 3* 5*  --   --  6* 6*  ALKPHOS 67 69  --   --  61 55  BILITOT 0.3 0.6  --   --  0.5 0.3  PROT 6.1 6.5  --   --  5.6* 5.6*  ALBUMIN 2.5* 2.4* 2.1* 1.9* 1.8* 1.7*    Recent Labs Lab 11/27/14 1043  AMMONIA 26   CBC:  Recent Labs Lab 11/27/14 1012 11/28/14 0249 11/28/14 1529 11/29/14 0329 11/30/14 0305  WBC 12.6* 13.4* 14.3* 14.2* 12.0*  NEUTROABS 10.0*  --   --  10.5* 8.7*  HGB 11.6* 10.3* 10.6* 10.0* 9.7*  HCT 34.5* 30.6* 31.2* 30.2* 28.7*  MCV 96.1 94.2 95.7 96.5 95.3  PLT 322 318 356 317 379    Recent Results (from the past 240 hour(s))  Urine culture     Status: None   Collection Time: 11/27/14 12:24 PM  Result Value Ref Range Status   Specimen Description URINE, RANDOM  Final   Special Requests NONE  Final   Culture NO GROWTH 1 DAY  Final   Report Status 11/28/2014 FINAL  Final  MRSA PCR Screening     Status: None   Collection Time: 11/27/14  5:27 PM  Result Value Ref Range Status   MRSA by PCR NEGATIVE NEGATIVE Final    Comment:        The GeneXpert MRSA Assay (FDA approved for NASAL specimens only), is one component of a comprehensive MRSA colonization surveillance program. It is not intended to diagnose MRSA infection nor to guide or monitor treatment for MRSA infections.   Culture, Urine     Status: None   Collection Time: 11/28/14  12:12 PM  Result Value Ref Range Status   Specimen Description URINE, CATHETERIZED  Final   Special Requests NONE  Final   Culture NO GROWTH 1 DAY  Final   Report Status 11/29/2014 FINAL  Final  C difficile quick scan w PCR reflex     Status: None   Collection Time: 11/28/14  2:31 PM  Result Value Ref Range Status   C Diff antigen NEGATIVE NEGATIVE Final   C Diff toxin NEGATIVE NEGATIVE Final   C Diff interpretation Negative for toxigenic C. difficile  Final  Culture, blood (routine x  2)     Status: None (Preliminary result)   Collection Time: 11/28/14  2:55 PM  Result Value Ref Range Status   Specimen Description BLOOD LEFT HAND  Final   Special Requests BOTTLES DRAWN AEROBIC AND ANAEROBIC 10CC  Final   Culture NO GROWTH < 24 HOURS  Final   Report Status PENDING  Incomplete  Culture, blood (routine x 2)     Status: None (Preliminary result)   Collection Time: 11/28/14  3:10 PM  Result Value Ref Range Status   Specimen Description BLOOD RIGHT HAND  Final   Special Requests BOTTLES DRAWN AEROBIC AND ANAEROBIC 10CC  Final   Culture NO GROWTH < 24 HOURS  Final   Report Status PENDING  Incomplete     Studies:  Recent x-ray studies have been reviewed in detail by the Attending Physician  Scheduled Meds:  Scheduled Meds: . cyanocobalamin  500 mcg Oral Daily  . enoxaparin (LOVENOX) injection  30 mg Subcutaneous Q24H  . folic acid  1 mg Oral Daily  . multivitamin with minerals  1 tablet Oral Daily  . nicotine  21 mg Transdermal Daily  . sodium chloride  3 mL Intravenous Q12H  . thiamine  100 mg Oral Daily  . cyanocobalamin  1,000 mcg Oral Daily    Time spent on care of this patient: 35 mins  Cherene Altes, MD [facility] For Consults/Admissions - Flow Manager - [phone] Office  [phone]  Contact MD directly via text page:      [email or web address]      password [email or web address]  11/30/2014, 11:07 AM   LOS: 3 days

## 2014-11-30 NOTE — Consult Note (Addendum)
Albumin suggesting severe protein calorie malnutrition needs supplements Creatinine still elevated will continue to follow Filed Vitals:   11/30/14 0415 11/30/14 0752 11/30/14 0757 11/30/14 1254  BP: 153/89 166/91 148/82 152/79  Pulse: 98 98  92  Temp: 98.6 F (37 C) 97.8 F (36.6 C)    TempSrc: Oral Oral    Resp: 12 12  11   Height:      Weight:      SpO2: 99% 100%  100%   Dry gangrene toe unchanged  Ruta Hinds, MD [facility] Office: [phone] Pager: [phone]

## 2014-11-30 NOTE — Care Management Important Message (Signed)
Important Message  Patient Details  Name: Leon Foster MRN: [id number] Date of Birth: 1940/09/17   Medicare Important Message Given:  Yes-second notification given    Lacretia Leigh, RN 11/30/2014, 11:03 AM

## 2014-11-30 NOTE — Progress Notes (Signed)
  Echocardiogram 2D Echocardiogram has been performed.  Darlina Sicilian M 11/30/2014, 12:19 PM

## 2014-11-30 NOTE — Clinical Documentation Improvement (Signed)
Would you please help specify the medical condition related to clinical findings?   On admission, diagnosed as acute kidney failure and also stated that pt may need to go on dialysis.  GFR 8.  Creatinine started at 7.2 and now 6.95.  --Chronic kidney disease, stage 1- GFR > OR = 90 --Chronic kidney disease, stage 2 (mild) - GFR 60-89 --Chronic kidney disease, stage 3 (moderate) - GFR 30-59 --Chronic kidney disease, stage 4 (severe) - GFR 15-29 --Chronic kidney disease, stage 5- GFR < 15 --End-stage renal disease (ESRD)  Other clinical explanation Unable to determine at this time.   Thank You,   Paulene Floor, RN, BSN, MBA, [place] Clinical Documentation Specialist

## 2014-11-30 NOTE — Care Management Note (Signed)
Case Management Note  Patient Details  Name: Leon Foster MRN: [id number] Date of Birth: [birth year]  Subjective/Objective:         Adm w renal failure, inc wbc           Action/Plan: lives w wife, pcp dr Jeanie Cooks   Expected Discharge Date:                  Expected Discharge Plan:  Home/Self Care  In-House Referral:     Discharge planning Services     Post Acute Care Choice:    Choice offered to:     DME Arranged:    DME Agency:     HH Arranged:    [place] Agency:     Status of Service:     Medicare Important Message Given:    Date Medicare IM Given:    Medicare IM give by:    Date Additional Medicare IM Given:    Additional Medicare Important Message give by:     If discussed at [place] of Stay Meetings, dates discussed:    Additional Comments:ur review done  Lacretia Leigh, RN 11/30/2014, 10:52 AM

## 2014-12-01 DIAGNOSIS — I270 Primary pulmonary hypertension: Secondary | ICD-10-CM

## 2014-12-01 DIAGNOSIS — I272 Pulmonary hypertension, unspecified: Secondary | ICD-10-CM | POA: Diagnosis present

## 2014-12-01 LAB — CBC
HEMATOCRIT: 30.7 % — AB (ref 39.0–52.0)
HEMOGLOBIN: 10.3 g/dL — AB (ref 13.0–17.0)
MCH: 31.5 pg (ref 26.0–34.0)
MCHC: 33.6 g/dL (ref 30.0–36.0)
MCV: 93.9 fL (ref 78.0–100.0)
Platelets: 513 10*3/uL — ABNORMAL HIGH (ref 150–400)
RBC: 3.27 MIL/uL — AB (ref 4.22–5.81)
RDW: 17.7 % — ABNORMAL HIGH (ref 11.5–15.5)
WBC: 12.2 10*3/uL — ABNORMAL HIGH (ref 4.0–10.5)

## 2014-12-01 LAB — COMPREHENSIVE METABOLIC PANEL
ALBUMIN: 1.9 g/dL — AB (ref 3.5–5.0)
ALK PHOS: 65 U/L (ref 38–126)
ALT: 8 U/L — AB (ref 17–63)
ANION GAP: 11 (ref 5–15)
AST: 18 U/L (ref 15–41)
BILIRUBIN TOTAL: 0.3 mg/dL (ref 0.3–1.2)
BUN: 22 mg/dL — AB (ref 6–20)
CALCIUM: 8.4 mg/dL — AB (ref 8.9–10.3)
CO2: 19 mmol/L — AB (ref 22–32)
CREATININE: 6.45 mg/dL — AB (ref 0.61–1.24)
Chloride: 112 mmol/L — ABNORMAL HIGH (ref 101–111)
GFR calc Af Amer: 9 mL/min — ABNORMAL LOW (ref >60)
GFR calc non Af Amer: 8 mL/min — ABNORMAL LOW (ref >60)
GLUCOSE: 83 mg/dL (ref 65–99)
Potassium: 4 mmol/L (ref 3.5–5.1)
SODIUM: 142 mmol/L (ref 135–145)
TOTAL PROTEIN: 6.3 g/dL — AB (ref 6.5–8.1)

## 2014-12-01 LAB — FOLATE: Folate: 18.8 ng/mL (ref >5.9)

## 2014-12-01 LAB — VITAMIN B12: Vitamin B-12: 617 pg/mL (ref 180–914)

## 2014-12-01 MED ORDER — SODIUM CHLORIDE 0.9 % IV SOLN
INTRAVENOUS | Status: DC
  Administered 2014-12-01: 17:00:00 via INTRAVENOUS

## 2014-12-01 NOTE — Progress Notes (Signed)
Assessment:  1. Acute/Subacute renal failure (Scr 11/05/14 0.7). Came in 5.27 >>7.02. Now trending down 6.45. Suspect due to ATN from hypotension and volume depletion in face of gangrenous toe. UO improving. Wt up with hydration.  2. 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  3. ETOH abuse 4. Probable underlying dementia  Plan: 1. Continue fluid at 50cc/hr to maintain euvolemia.  2. Daily SCr. Since his renal fx was normal 2 weeks ago I anticipate renal fx to recover. No need for HD yet, will monitor if he gets IV contrast.   Renal Attending: Stable RF with slow recovery.  Agree with note as articulated above. Daisean Brodhead C   Subjective: Interval History: No change. No significant pain on the left foot.   Objective: Vital signs in last 24 hours: Temp:  [97.7 F (36.5 C)-98 F (36.7 C)] 98 F (36.7 C) (08/16 0400) Pulse Rate:  [90-105] 90 (08/16 1343) Resp:  [11-18] 14 (08/16 1343) BP: (161-180)/(88-100) 161/90 mmHg (08/16 1343) SpO2:  [100 %] 100 % (08/16 1343) Weight change:   Intake/Output from previous day: 08/15 0701 - 08/16 0700 In: 1065 [P.O.:840; I.V.:225] Out: 350 [Urine:350] Intake/Output this shift: Total I/O In: 480 [P.O.:480] Out: 500 [Urine:500]  General appearance: alert and cooperative Resp: clear to auscultation bilaterally Chest wall: no tenderness Cardio: regular rate and rhythm, S1, S2 normal, no murmur, click, rub or gallop GI: soft, non-tender; bowel sounds normal; no masses,  no organomegaly Extremities: hyperpigmented swollen digits with mild tenderness on L  Lab Results:  Recent Labs  11/30/14 0305 12/01/14 0459  WBC 12.0* 12.2*  HGB 9.7* 10.3*  HCT 28.7* 30.7*  PLT 379 513*   BMET:   Recent Labs  11/30/14 0305 12/01/14 0459  NA 141 142  K 3.7 4.0  CL 114* 112*  CO2 18* 19*  GLUCOSE 95 83  BUN 22* 22*  CREATININE 7.02* 6.45*  CALCIUM 7.7* 8.4*   No results for input(s): PTH in the last 72  hours. Iron Studies: No results for input(s): IRON, TIBC, TRANSFERRIN, FERRITIN in the last 72 hours. Studies/Results: No results found.  Scheduled: . cyanocobalamin  500 mcg Oral Daily  . enoxaparin (LOVENOX) injection  30 mg Subcutaneous Q24H  . feeding supplement (NEPRO CARB STEADY)  237 mL Oral TID WC  . folic acid  1 mg Oral Daily  . multivitamin with minerals  1 tablet Oral Daily  . nicotine  21 mg Transdermal Daily  . sodium bicarbonate  650 mg Oral TID  . sodium chloride  3 mL Intravenous Q12H  . thiamine  100 mg Oral Daily  . cyanocobalamin  1,000 mcg Oral Daily    LOS: 4 days   [name], [name] 12/01/2014,1:54 PM

## 2014-12-01 NOTE — Progress Notes (Signed)
Leon Foster TEAM 1 - Stepdown/ICU TEAM Progress Note  Leon Foster JAS:[id number] DOB: [birth year] DOA: 11/27/2014 PCP: Philis Fendt, MD  Admit HPI / Brief Narrative: 74 y.o. BM single, lives alone, PMHx of heavy alcohol tobacco abuse since age 30 (,smokes 1ppd. He reports drinking two bottles of wine )  Evaluated at urgent care center and by vascular surgery/Dr. Ruta Hinds on 11/26/14 for gangrenous left second toe and labs which were drawn yesterday showed acute renal failure with creatinine 5.27 and family was advised to bring patient to [facility] ED. Patient is unable to provide any history secondary to confusion. History is obtained from patient's brother and 2 sisters at bedside. According to them, patient has been complaining of left foot pain for approximately a year which progressively got worse in the last 2 weeks. One of the sisters looked at his foot for the first time 3 nights ago and noticed black discoloration of second left toe. He was taken to urgent care center yesterday and then evaluated by vascular surgery who planned for an aortogram and possible intervention on 11/30/14. At this time patient is not complaining of foot pain. He does not know why he is in the hospital. According to family, he has intermittent urinary and fecal incontinence. He apparently had some "shakes" yesterday. Apart from this, family denies any other complaints. No reported chest pain, dyspnea, cough, fever, chills, nausea or vomiting. In the ED, repeat labs show creatinine of 7.20 (normal creatinine of 1.06 on 08/20/14), lactate 2.06, normal ammonia and INR, hemoglobin 11.6 WBC 12.6, chest x-ray with emphysema but no acute findings and left foot x-ray shows no evidence of osteomyelitis. Patient was empirically started on IV vancomycin and Zosyn for presumed cellulitis of left toe/foot. Hospitalist admission was requested.    HPI/Subjective: 8/16  A/O 3, (does not know when), follows all commands.     Assessment/Plan: Dry gangrene left second metatarsal/sepsis unspecified organism? -Per vascular surgeryAortogram with bilateral runoff and possible intervention with Dr. Oneida Alar on Monday 11/30/14. Discussed that if he has extensive disease not amenable to percutaneous intervention, may require bypass or AKA   vs BKA  -Blood cultures NGTD -Urine cultures negative  Acute renal failure(Normal Cr 1.06 on 08/20/14). -Admitting creatinine 7.2 on 11/27/14.  -8/16  Cr = 6.45  -Family understands that patient may wind up on hemodialysis. -Continue aggressive hydration normal saline 125 ml/hr -Strict I&O since admission  +5.3L -Daily a.m. weight  -Renal ultrasound; no obstruction see results below -Patient with continued poor output; nephrology will decide if/when HD required -Avoid nephrotoxic medication  Dehydration -Continue IV fluids normal saline 50 mL/hr; patient still not consuming significant amounts of fluids/nutrition  Pulmonary hypertension -Echocardiogram verifies pulmonary hypertension see results below.  Hypokalemia -Potassium IV 40 mEq 1  Tobacco abuse:  -Continue nicotine patch  Alcohol abuse -continue CIWA protocol COPD: Stable.  Altered mental status  -Most likely secondary to alcohol abuse     Code Status: FULL Family Communication: family present at time of exam Disposition Plan: Vascular surgery    Consultants: Dr.Michael Mercy Moore (nephrology) Dr.Montreal E Fields (vascular surgery)   Procedure/Significant Events: 8/12 renal ultrasound;Small echogenic foci w/i left kidney small nonobstructing stones. 8/15 echocardiogram;- LVEF=60%. Wall motion normal; there were no regional wall motion abnormalities. - Pulmonary arteries: PA peak pressure: 40 mm Hg (S).   Culture is in a 8/12 urine negative 8/12 MRSA by PCR negative 8/13 urine negative 8/13 blood left/right hand NGTD    Antibiotics: Zosyn 8/12>> Vancomycin 8/12>  DVT  prophylaxis: Heparin   Devices    LINES / TUBES:      Continuous Infusions: . sodium chloride      Objective: VITAL SIGNS: Temp: 98 F (36.7 C) (08/16 0400) Temp Source: Oral (08/16 0400) BP: 166/95 mmHg (08/16 0726) Pulse Rate: 105 (08/16 0726) SPO2; FIO2:   Intake/Output Summary (Last 24 hours) at 12/01/14 1144 Last data filed at 12/01/14 1042  Gross per 24 hour  Intake   1200 ml  Output    850 ml  Net    350 ml     Exam: General:  A/O 3, (does not know when), follows all commands.. cachectic, unkempt, No acute respiratory distress Eyes: negative scleral hemorrhage ENT: Negative Runny nose, negative gingival bleeding, poor dentation. Neck:  Negative scars, masses, torticollis, lymphadenopathy, JVD Lungs: Clear to auscultation bilaterally without wheezes or crackles Cardiovascular: Regular rhythm and rate without murmur gallop or rub normal S1 and S2 Abdomen:negative abdominal pain, negative dysphagia, nondistended, positive soft, bowel sounds, no rebound, no ascites, no appreciable mass Extremities: No significant cyanosis, clubbing, or edema on the RLE. However LLE DP/PT pulse not palpable, foot cool to touch, metatarsals 1 through 4 dusky, painful to the touch; the worst is first and second metatarsal followed by the fifth metatarsal. Discoloration of all toes worsening. Psychiatric: Unable to assess      Data Reviewed: Basic Metabolic Panel:  Recent Labs Lab 11/27/14 1000  11/28/14 0249 11/28/14 1529 11/29/14 0329 11/30/14 0305 12/01/14 0459  NA  --   < > 138 140 139 141 142  K  --   < > 3.4* 3.3* 4.2 3.7 4.0  CL  --   < > 105 108 109 114* 112*  CO2  --   < > 22 20* 20* 18* 19*  GLUCOSE  --   < > 91 121* 84 95 83  BUN  --   < > 21* 21* 21* 22* 22*  CREATININE  --   < > 6.91* 6.88* 6.95* 7.02* 6.45*  CALCIUM  --   < > 7.6* 6.9* 7.7* 7.7* 8.4*  MG 1.5*  --   --   --  2.0 1.9  --   PHOS  --   --  4.4 3.9 4.5  --   --   < > = values in this  interval not displayed. Liver Function Tests:  Recent Labs Lab 11/26/14 1308 11/27/14 1012 11/28/14 0249 11/28/14 1529 11/29/14 0329 11/30/14 0305 12/01/14 0459  AST 9* 17  --   --  15 16 18   ALT 3* 5*  --   --  6* 6* 8*  ALKPHOS 67 69  --   --  61 55 65  BILITOT 0.3 0.6  --   --  0.5 0.3 0.3  PROT 6.1 6.5  --   --  5.6* 5.6* 6.3*  ALBUMIN 2.5* 2.4* 2.1* 1.9* 1.8* 1.7* 1.9*   No results for input(s): LIPASE, AMYLASE in the last 168 hours.  Recent Labs Lab 11/27/14 1043  AMMONIA 26   CBC:  Recent Labs Lab 11/27/14 1012 11/28/14 0249 11/28/14 1529 11/29/14 0329 11/30/14 0305 12/01/14 0459  WBC 12.6* 13.4* 14.3* 14.2* 12.0* 12.2*  NEUTROABS 10.0*  --   --  10.5* 8.7*  --   HGB 11.6* 10.3* 10.6* 10.0* 9.7* 10.3*  HCT 34.5* 30.6* 31.2* 30.2* 28.7* 30.7*  MCV 96.1 94.2 95.7 96.5 95.3 93.9  PLT 322 318 356 317 379 513*   Cardiac Enzymes: No  results for input(s): CKTOTAL, CKMB, CKMBINDEX, TROPONINI in the last 168 hours. BNP (last 3 results) No results for input(s): BNP in the last 8760 hours.  ProBNP (last 3 results) No results for input(s): PROBNP in the last 8760 hours.  CBG: No results for input(s): GLUCAP in the last 168 hours.  Recent Results (from the past 240 hour(s))  Urine culture     Status: None   Collection Time: 11/27/14 12:24 PM  Result Value Ref Range Status   Specimen Description URINE, RANDOM  Final   Special Requests NONE  Final   Culture NO GROWTH 1 DAY  Final   Report Status 11/28/2014 FINAL  Final  MRSA PCR Screening     Status: None   Collection Time: 11/27/14  5:27 PM  Result Value Ref Range Status   MRSA by PCR NEGATIVE NEGATIVE Final    Comment:        The GeneXpert MRSA Assay (FDA approved for NASAL specimens only), is one component of a comprehensive MRSA colonization surveillance program. It is not intended to diagnose MRSA infection nor to guide or monitor treatment for MRSA infections.   Culture, Urine     Status:  None   Collection Time: 11/28/14 12:12 PM  Result Value Ref Range Status   Specimen Description URINE, CATHETERIZED  Final   Special Requests NONE  Final   Culture NO GROWTH 1 DAY  Final   Report Status 11/29/2014 FINAL  Final  C difficile quick scan w PCR reflex     Status: None   Collection Time: 11/28/14  2:31 PM  Result Value Ref Range Status   C Diff antigen NEGATIVE NEGATIVE Final   C Diff toxin NEGATIVE NEGATIVE Final   C Diff interpretation Negative for toxigenic C. difficile  Final  Culture, blood (routine x 2)     Status: None (Preliminary result)   Collection Time: 11/28/14  2:55 PM  Result Value Ref Range Status   Specimen Description BLOOD LEFT HAND  Final   Special Requests BOTTLES DRAWN AEROBIC AND ANAEROBIC 10CC  Final   Culture NO GROWTH 2 DAYS  Final   Report Status PENDING  Incomplete  Culture, blood (routine x 2)     Status: None (Preliminary result)   Collection Time: 11/28/14  3:10 PM  Result Value Ref Range Status   Specimen Description BLOOD RIGHT HAND  Final   Special Requests BOTTLES DRAWN AEROBIC AND ANAEROBIC 10CC  Final   Culture NO GROWTH 2 DAYS  Final   Report Status PENDING  Incomplete     Studies:  Recent x-ray studies have been reviewed in detail by the Attending Physician  Scheduled Meds:  Scheduled Meds: . cyanocobalamin  500 mcg Oral Daily  . enoxaparin (LOVENOX) injection  30 mg Subcutaneous Q24H  . feeding supplement (NEPRO CARB STEADY)  237 mL Oral TID WC  . folic acid  1 mg Oral Daily  . multivitamin with minerals  1 tablet Oral Daily  . nicotine  21 mg Transdermal Daily  . sodium bicarbonate  650 mg Oral TID  . sodium chloride  3 mL Intravenous Q12H  . thiamine  100 mg Oral Daily  . cyanocobalamin  1,000 mcg Oral Daily    Time spent on care of this patient: 40 mins   Leon Foster, Geraldo Docker , MD  [facility] Office  [phone] Pager - [phone]  On-Call/Text Page:      [email or web address]      password [email or web address]  If 7PM-7AM,  please contact night-coverage [email or web address] Password [email or web address] 12/01/2014, 11:44 AM   LOS: 4 days   Care during the described time interval was provided by me .  I have reviewed this patient's available data, including medical history, events of note, physical examination, and all test results as part of my evaluation. I have personally reviewed and interpreted all radiology studies.   Dia Crawford, MD [phone] Pager

## 2014-12-01 NOTE — Progress Notes (Addendum)
  [facility] Progress Note  Subjective  Having some pain with left foot. Denies fever or chills.   Afebrile.   Objective Filed Vitals:   12/01/14 0726  BP: 166/95  Pulse: 105  Temp:   Resp: 13    Intake/Output Summary (Last 24 hours) at 12/01/14 1225 Last data filed at 12/01/14 1042  Gross per 24 hour  Intake   1200 ml  Output    850 ml  Net    350 ml    Dry gangrene left second toe. Now with duskiness to left great toe, plantar aspect left 3rd and 5th toes. No drainage. No odor. No cellulitis.   Assessment/Planning: 74 y.o. male with ischemic left 2nd toe now with duskiness of surrounding toes.  Toes do not appear infected.  Afebrile. Mild ABC 12.2 Still with ARF, Scr: 6.45 Hopefully kidney function improves to perform arteriogram.  Continue to monitor. If wounds become infected and patient septic, will proceed with AKA vs BKA.   Alvia Grove 12/01/2014 12:25 PM --  Laboratory CBC    Component Value Date/Time   WBC 12.2* 12/01/2014 0459   WBC 11.7* 11/26/2014 1311   HGB 10.3* 12/01/2014 0459   HGB 10.9* 11/26/2014 1311   HCT 30.7* 12/01/2014 0459   HCT 34.7* 11/26/2014 1311   PLT 513* 12/01/2014 0459    BMET    Component Value Date/Time   NA 142 12/01/2014 0459   K 4.0 12/01/2014 0459   CL 112* 12/01/2014 0459   CO2 19* 12/01/2014 0459   GLUCOSE 83 12/01/2014 0459   BUN 22* 12/01/2014 0459   CREATININE 6.45* 12/01/2014 0459   CREATININE 5.27* 11/26/2014 1308   CALCIUM 8.4* 12/01/2014 0459   GFRNONAA 8* 12/01/2014 0459   GFRNONAA 10* 11/26/2014 1308   GFRAA 9* 12/01/2014 0459   GFRAA 12* 11/26/2014 1308    COAG Lab Results  Component Value Date   INR 1.10 11/27/2014   No results found for: PTT  Antibiotics Anti-infectives    Start     Dose/Rate Route Frequency Ordered Stop   11/27/14 1800  piperacillin-tazobactam (ZOSYN) IVPB 2.25 g  Status:  Discontinued     2.25 g 100 mL/hr over 30 Minutes Intravenous 3 times  per day 11/27/14 1055 11/27/14 1729   11/27/14 1100  vancomycin (VANCOCIN) IVPB 1000 mg/200 mL premix     1,000 mg 200 mL/hr over 60 Minutes Intravenous  Once 11/27/14 1044 11/27/14 1311   11/27/14 1045  piperacillin-tazobactam (ZOSYN) IVPB 3.375 g     3.375 g 100 mL/hr over 30 Minutes Intravenous  Once 11/27/14 1044 11/27/14 1225       Virgina Jock, PA-C [facility] Office: [phone] Pager: [phone] 12/01/2014 12:25 PM  Agree with above.  Still with dry gangrene left 2nd toe.  Some early changes to first toe.  Painful if toe is touched but otherwise about the same.  No fever or hypotension.  No indication for urgent amputation at this point.  Will continue to await return of renal function and hopefully agram.  If his foot dies before the kidneys recover he will need amputation of leg but we are currently not at that point.  Family updated at bedside  Ruta Hinds, MD [facility] [place] Office: [phone] Pager: [phone]

## 2014-12-02 LAB — BASIC METABOLIC PANEL
Anion gap: 9 (ref 5–15)
BUN: 22 mg/dL — AB (ref 6–20)
CHLORIDE: 111 mmol/L (ref 101–111)
CO2: 20 mmol/L — AB (ref 22–32)
CREATININE: 5.07 mg/dL — AB (ref 0.61–1.24)
Calcium: 8 mg/dL — ABNORMAL LOW (ref 8.9–10.3)
GFR calc Af Amer: 12 mL/min — ABNORMAL LOW (ref >60)
GFR calc non Af Amer: 10 mL/min — ABNORMAL LOW (ref >60)
Glucose, Bld: 90 mg/dL (ref 65–99)
POTASSIUM: 3.5 mmol/L (ref 3.5–5.1)
SODIUM: 140 mmol/L (ref 135–145)

## 2014-12-02 LAB — HIV ANTIBODY (ROUTINE TESTING W REFLEX): HIV Screen 4th Generation wRfx: NONREACTIVE

## 2014-12-02 LAB — RPR: RPR: NONREACTIVE

## 2014-12-02 MED ORDER — DIPHENOXYLATE-ATROPINE 2.5-0.025 MG PO TABS
2.0000 | ORAL_TABLET | Freq: Once | ORAL | Status: AC
  Administered 2014-12-02: 2 via ORAL
  Filled 2014-12-02: qty 2

## 2014-12-02 MED ORDER — AMLODIPINE BESYLATE 2.5 MG PO TABS
2.5000 mg | ORAL_TABLET | Freq: Every day | ORAL | Status: DC
  Administered 2014-12-02 – 2014-12-15 (×14): 2.5 mg via ORAL
  Filled 2014-12-02 (×14): qty 1

## 2014-12-02 NOTE — Progress Notes (Signed)
Assessment:  1. Acute/Subacute renal failure (baseline ~1.0). Came in 5.27 >>7.02. Now trending down nicely to 5.07 today. Suspect due to ATN from hypotension and volume depletion in setting of gangrenous toe. UO also improving.  2. 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 ETOH abuse with withdrawal 4. Probable underlying dementia 5. HTN  Plan: 1. Continue fluid at 50cc/hr to maintain euvolemia and hopefully creatinine will improve. Monitor Scr Daily. No need for HD. 2. We recommend deferring Arteriogram until SCR returns to baseline ~1.0 3. Initiate amlodipine 2.5mg  daily for HTN.   Subjective: Interval History: had some alcohol withdrawal related hallucination, was medicated and sleeping during the interview today.   Objective: Vital signs in last 24 hours: Temp:  [97.9 F (36.6 Leon Foster)-98.2 F (36.8 Leon Foster)] 98.1 F (36.7 Leon Foster) (08/17 0825) Pulse Rate:  [90-104] 104 (08/17 0441) Resp:  [13-21] 13 (08/17 1200) BP: (133-169)/(77-93) 133/83 mmHg (08/17 1200) SpO2:  [100 %] 100 % (08/17 0825) Weight:  [130 lb 8.2 oz (59.2 kg)] 130 lb 8.2 oz (59.2 kg) (08/17 0500) Weight change:   Intake/Output from previous day: 08/16 0701 - 08/17 0700 In: 4665 [P.O.:1120; I.V.:350] Out: 650 [Urine:650] Intake/Output this shift:    General appearance: sleeping during the interview.  Resp: clear to auscultation bilaterally Chest wall: no tenderness Cardio: regular rate and rhythm, S1, S2 normal, no murmur, click, rub or gallop GI: soft, non-tender; bowel sounds normal; no masses,  no organomegaly Extremities: hyperpigmented swollen digits with mild tenderness on L  Lab Results:  Recent Labs  11/30/14 0305 12/01/14 0459  WBC 12.0* 12.2*  HGB 9.7* 10.3*  HCT 28.7* 30.7*  PLT 379 513*   BMET:   Recent Labs  12/01/14 0459 12/02/14 0806  NA 142 140  K 4.0 3.5  CL 112* 111  CO2 19* 20*  GLUCOSE 83 90  BUN 22* 22*  CREATININE 6.45* 5.07*  CALCIUM 8.4* 8.0*   No results for input(s): PTH in the  last 72 hours. Iron Studies: No results for input(s): IRON, TIBC, TRANSFERRIN, FERRITIN in the last 72 hours. Studies/Results: No results found.  Scheduled: . cyanocobalamin  500 mcg Oral Daily  . enoxaparin (LOVENOX) injection  30 mg Subcutaneous Q24H  . feeding supplement (NEPRO CARB STEADY)  237 mL Oral TID WC  . folic acid  1 mg Oral Daily  . multivitamin with minerals  1 tablet Oral Daily  . nicotine  21 mg Transdermal Daily  . sodium bicarbonate  650 mg Oral TID  . sodium chloride  3 mL Intravenous Q12H  . thiamine  100 mg Oral Daily  . cyanocobalamin  1,000 mcg Oral Daily    LOS: 5 days   Leon Foster, Leon Foster 12/02/2014,1:17 PM  Renal Attending: Agree with note as articulated above and I examined the pt and reviewed and discussed plans with the Resident Physician above. Leon Foster Leon Foster

## 2014-12-02 NOTE — Progress Notes (Signed)
MD paged to make aware of small pink amount of blood noted in stool this am. No new orders received will continue to monitor patient. C.Diyari Cherne,RN-BC

## 2014-12-02 NOTE — Progress Notes (Signed)
[place] TEAM 1 - Stepdown/ICU TEAM Progress Note  AMOR PACKARD VHQ:[id number] DOB: [birth year] DOA: 11/27/2014 PCP: Philis Fendt, MD  Admit HPI / Brief Narrative: 74 y.o. M who lives alone w/ a Hx of heavy alcohol (2 bottles of wine/day) and tobacco abuse (1ppd since age 78) who was evaluated by Vascular Surgery/Dr. Ruta Hinds on 11/26/14.  During his w/u labs showed creatinine 5.27 and family was advised to bring patient to [facility] ED. According to family patient had been complaining of left foot pain for approximately a year which progressively got worse over the preceding 2 weeks. One of his sisters looked at his foot and noticed black discoloration of the second left toe. He was taken to urgent care center and then evaluated by vascular surgery who planned for an aortogram and possible intervention on 11/30/14.   In the ED, repeat labs noted creatinine of 7.20 (normal creatinine of 1.06 on 08/20/14), lactate 2.06, normal ammonia and INR, hemoglobin 11.6 WBC 12.6, chest x-ray with emphysema but no acute findings and left foot x-ray w/ no evidence of osteomyelitis. Patient was empirically started on IV vancomycin and Zosyn for presumed cellulitis of left toe/foot.   HPI/Subjective: There is no family present at the time of my visit today.  The patient is alert but minimally conversant.  He denies any complaints.  He cannot provide a detailed review of systems due to his apparent confusion.  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 unspecified organism -Vasc Surgery is following, w/ arteriogram on hold until renal function improved/stabilized - discussed that if he has extensive disease not amenable to percutaneous intervention, may require bypass or AKA  vs BKA  -No new culture data today - no indication for antibiotics at the present time  Acute renal failure (NOT CKD) -Cr 1.06 on 08/20/14 -Admitting creatinine 7.2 11/27/14  -Renal ultrasound w/o obstruction    -Nephrology following -Creatinine slowly improving - hopeful at this point the patient's renal function will recover to normal  Dehydration -Continue IV support   Hypokalemia -follow trend - in setting of AKD will not replace unless K+ < 3.0  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 versus simple withdrawal symptoms - follow clinically - U13 and folic acid normal - RPR nonreactive  Code Status: FULL Family Communication: No family present at time of exam today Disposition Plan: If remains stable overnight we'll consider transfer to renal floor 8/18 - continue to follow renal function with eventual plan for vascular surgery  Consultants: Nephrology Vascular surgery  Procedure/Significant Events: 8/12 renal ultrasound Small echogenic foci w/i left kidney small nonobstructing stones  Antibiotics: Zosyn 8/12  Vancomycin 8/12   DVT prophylaxis: lovenox  Objective: Blood pressure 152/90, pulse 102, temperature 98.1 F (36.7 C), temperature source Oral, resp. rate 18, height 5\' 7"  (1.702 m), weight 59.2 kg (130 lb 8.2 oz), SpO2 100 %.  Intake/Output Summary (Last 24 hours) at 12/02/14 1521 Last data filed at 12/02/14 0500  Gross per 24 hour  Intake    750 ml  Output    150 ml  Net    600 ml   Exam: General: No acute respiratory distress - alert but delirious Lungs: Clear to auscultation bilaterally without wheezes or crackles Cardiovascular: Regular rate and rhythm without murmur gallop or rub Abdomen: Nontender, nondistended, soft, bowel sounds positive, no rebound,  no ascites, no appreciable mass Extremities: No significant clubbing, or edema bilateral lower extremities - L 2nd toe black and appears non-vital - L great toe darker than other toes - no purulent d/c - no erythema - no clinical  evidence of cellulitis - exam without change today compared to my last exam 11/30/14  Data Reviewed: Basic Metabolic Panel:  Recent Labs Lab 11/27/14 1000  11/28/14 0249 11/28/14 1529 11/29/14 0329 11/30/14 0305 12/01/14 0459 12/02/14 0806  NA  --   < > 138 140 139 141 142 140  K  --   < > 3.4* 3.3* 4.2 3.7 4.0 3.5  CL  --   < > 105 108 109 114* 112* 111  CO2  --   < > 22 20* 20* 18* 19* 20*  GLUCOSE  --   < > 91 121* 84 95 83 90  BUN  --   < > 21* 21* 21* 22* 22* 22*  CREATININE  --   < > 6.91* 6.88* 6.95* 7.02* 6.45* 5.07*  CALCIUM  --   < > 7.6* 6.9* 7.7* 7.7* 8.4* 8.0*  MG 1.5*  --   --   --  2.0 1.9  --   --   PHOS  --   --  4.4 3.9 4.5  --   --   --   < > = values in this interval not displayed. Liver Function Tests:  Recent Labs Lab 11/26/14 1308 11/27/14 1012 11/28/14 0249 11/28/14 1529 11/29/14 0329 11/30/14 0305 12/01/14 0459  AST 9* 17  --   --  15 16 18   ALT 3* 5*  --   --  6* 6* 8*  ALKPHOS 67 69  --   --  61 55 65  BILITOT 0.3 0.6  --   --  0.5 0.3 0.3  PROT 6.1 6.5  --   --  5.6* 5.6* 6.3*  ALBUMIN 2.5* 2.4* 2.1* 1.9* 1.8* 1.7* 1.9*    Recent Labs Lab 11/27/14 1043  AMMONIA 26   CBC:  Recent Labs Lab 11/27/14 1012 11/28/14 0249 11/28/14 1529 11/29/14 0329 11/30/14 0305 12/01/14 0459  WBC 12.6* 13.4* 14.3* 14.2* 12.0* 12.2*  NEUTROABS 10.0*  --   --  10.5* 8.7*  --   HGB 11.6* 10.3* 10.6* 10.0* 9.7* 10.3*  HCT 34.5* 30.6* 31.2* 30.2* 28.7* 30.7*  MCV 96.1 94.2 95.7 96.5 95.3 93.9  PLT 322 318 356 317 379 513*    Recent Results (from the past 240 hour(s))  Urine culture     Status: None   Collection Time: 11/27/14 12:24 PM  Result Value Ref Range Status   Specimen Description URINE, RANDOM  Final   Special Requests NONE  Final   Culture NO GROWTH 1 DAY  Final   Report Status 11/28/2014 FINAL  Final  MRSA PCR Screening     Status: None   Collection Time: 11/27/14  5:27 PM  Result Value Ref Range Status   MRSA by PCR NEGATIVE  NEGATIVE Final    Comment:        The GeneXpert MRSA Assay (FDA approved for NASAL specimens only), is one component of a comprehensive MRSA colonization surveillance program. It is not intended to diagnose MRSA infection nor to guide or monitor treatment for MRSA infections.   Culture, Urine     Status: None   Collection Time: 11/28/14 12:12 PM  Result Value Ref Range Status   Specimen Description URINE, CATHETERIZED  Final   Special  Requests NONE  Final   Culture NO GROWTH 1 DAY  Final   Report Status 11/29/2014 FINAL  Final  C difficile quick scan w PCR reflex     Status: None   Collection Time: 11/28/14  2:31 PM  Result Value Ref Range Status   C Diff antigen NEGATIVE NEGATIVE Final   C Diff toxin NEGATIVE NEGATIVE Final   C Diff interpretation Negative for toxigenic C. difficile  Final  Culture, blood (routine x 2)     Status: None (Preliminary result)   Collection Time: 11/28/14  2:55 PM  Result Value Ref Range Status   Specimen Description BLOOD LEFT HAND  Final   Special Requests BOTTLES DRAWN AEROBIC AND ANAEROBIC 10CC  Final   Culture NO GROWTH 4 DAYS  Final   Report Status PENDING  Incomplete  Culture, blood (routine x 2)     Status: None (Preliminary result)   Collection Time: 11/28/14  3:10 PM  Result Value Ref Range Status   Specimen Description BLOOD RIGHT HAND  Final   Special Requests BOTTLES DRAWN AEROBIC AND ANAEROBIC 10CC  Final   Culture NO GROWTH 4 DAYS  Final   Report Status PENDING  Incomplete     Studies:  Recent x-ray studies have been reviewed in detail by the Attending Physician  Scheduled Meds:  Scheduled Meds: . amLODipine  2.5 mg Oral Daily  . cyanocobalamin  500 mcg Oral Daily  . enoxaparin (LOVENOX) injection  30 mg Subcutaneous Q24H  . feeding supplement (NEPRO CARB STEADY)  237 mL Oral TID WC  . folic acid  1 mg Oral Daily  . multivitamin with minerals  1 tablet Oral Daily  . nicotine  21 mg Transdermal Daily  . sodium  bicarbonate  650 mg Oral TID  . sodium chloride  3 mL Intravenous Q12H  . thiamine  100 mg Oral Daily  . cyanocobalamin  1,000 mcg Oral Daily    Time spent on care of this patient: 25 mins  Cherene Altes, MD [facility] For Consults/Admissions - Flow Manager - [phone] Office  [phone]  Contact MD directly via text page:      [email or web address]      password [email or web address]  12/02/2014, 3:21 PM   LOS: 5 days

## 2014-12-02 NOTE — Consult Note (Signed)
[facility] [place]   Objective 152/90 102 98.1 F (36.7 C) (Oral) 18 100%  Intake/Output Summary (Last 24 hours) at 12/02/14 1506 Last data filed at 12/02/14 0500  Gross per 24 hour  Intake    750 ml  Output    150 ml  Net    600 ml     Assessment/Planning: Still with elevated creatinine Leukocytosis stable Will recheck Friday  Lamark, Schue 12/02/2014 3:06 PM --  Laboratory Lab Results:  Recent Labs  11/30/14 0305 12/01/14 0459  WBC 12.0* 12.2*  HGB 9.7* 10.3*  HCT 28.7* 30.7*  PLT 379 513*   BMET  Recent Labs  12/01/14 0459 12/02/14 0806  NA 142 140  K 4.0 3.5  CL 112* 111  CO2 19* 20*  GLUCOSE 83 90  BUN 22* 22*  CREATININE 6.45* 5.07*  CALCIUM 8.4* 8.0*    COAG Lab Results  Component Value Date   INR 1.10 11/27/2014   No results found for: PTT

## 2014-12-03 DIAGNOSIS — I739 Peripheral vascular disease, unspecified: Secondary | ICD-10-CM

## 2014-12-03 DIAGNOSIS — D72829 Elevated white blood cell count, unspecified: Secondary | ICD-10-CM | POA: Insufficient documentation

## 2014-12-03 LAB — CBC
HEMATOCRIT: 28.2 % — AB (ref 39.0–52.0)
Hemoglobin: 9.5 g/dL — ABNORMAL LOW (ref 13.0–17.0)
MCH: 31.4 pg (ref 26.0–34.0)
MCHC: 33.7 g/dL (ref 30.0–36.0)
MCV: 93.1 fL (ref 78.0–100.0)
Platelets: 488 10*3/uL — ABNORMAL HIGH (ref 150–400)
RBC: 3.03 MIL/uL — AB (ref 4.22–5.81)
RDW: 18.3 % — AB (ref 11.5–15.5)
WBC: 10.9 10*3/uL — AB (ref 4.0–10.5)

## 2014-12-03 LAB — COMPREHENSIVE METABOLIC PANEL
ALT: 8 U/L — AB (ref 17–63)
AST: 14 U/L — AB (ref 15–41)
Albumin: 2.1 g/dL — ABNORMAL LOW (ref 3.5–5.0)
Alkaline Phosphatase: 56 U/L (ref 38–126)
Anion gap: 9 (ref 5–15)
BILIRUBIN TOTAL: 0.3 mg/dL (ref 0.3–1.2)
BUN: 19 mg/dL (ref 6–20)
CO2: 23 mmol/L (ref 22–32)
CREATININE: 4.58 mg/dL — AB (ref 0.61–1.24)
Calcium: 8.5 mg/dL — ABNORMAL LOW (ref 8.9–10.3)
Chloride: 116 mmol/L — ABNORMAL HIGH (ref 101–111)
GFR, EST AFRICAN AMERICAN: 13 mL/min — AB (ref >60)
GFR, EST NON AFRICAN AMERICAN: 12 mL/min — AB (ref >60)
Glucose, Bld: 89 mg/dL (ref 65–99)
Potassium: 3.4 mmol/L — ABNORMAL LOW (ref 3.5–5.1)
Sodium: 148 mmol/L — ABNORMAL HIGH (ref 135–145)
TOTAL PROTEIN: 6.1 g/dL — AB (ref 6.5–8.1)

## 2014-12-03 LAB — CULTURE, BLOOD (ROUTINE X 2)
CULTURE: NO GROWTH
Culture: NO GROWTH

## 2014-12-03 MED ORDER — DEXTROSE 5 % IV SOLN
INTRAVENOUS | Status: DC
  Administered 2014-12-03 – 2014-12-14 (×14): via INTRAVENOUS

## 2014-12-03 MED ORDER — POTASSIUM CHLORIDE CRYS ER 20 MEQ PO TBCR
40.0000 meq | EXTENDED_RELEASE_TABLET | Freq: Once | ORAL | Status: AC
  Administered 2014-12-03: 40 meq via ORAL
  Filled 2014-12-03: qty 2

## 2014-12-03 NOTE — Progress Notes (Signed)
Pt arrived to floor via bed. Tele applied and CCMD notified. Pt oriented to staff and unit. Resting comfortably in bed.

## 2014-12-03 NOTE — Progress Notes (Addendum)
Assessment:  1. Acute/Subacute renal failure (baseline ~1.0). Came in 5.27 >>7.02. Trending down nicely since then to 4.58 today. Suspect due to ATN from hypotension and volume depletion in setting of gangrenous toe. UO also improving.  2. 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 ETOH abuse with withdrawal 4. Probable underlying dementia 5. HTN - better with amlodipine.  6. Hypernatremia - likely 2/2 to NS -changed to D5W.   Plan: 1. Continue fluid at 50cc/hr without interruption to maintain euvolemia and hopefully creatinine will cont to improve.  2. We recommend deferring Arteriogram until SCR returns to baseline ~1.0 3. We will sign off. Please call us if you need anything.   Renal Attending: I have reviewed this patient with the Resident Physician, examined pt and took a history.  I agree with the plan and evaluation outlined above. Cont hypotonic IVFs for now and avoid contrast until renal fct improved.  Thank you. Madelon Welsch C  Subjective: Interval History: more awake today. Denies any complaints.   Objective: Vital signs in last 24 hours: Temp:  [97.8 F (36.6 C)-98.5 F (36.9 C)] 97.9 F (36.6 C) (08/18 0800) Pulse Rate:  [58-117] 58 (08/18 0900) Resp:  [12-23] 22 (08/18 0900) BP: (133-170)/(76-102) 148/102 mmHg (08/18 0900) SpO2:  [94 %-100 %] 100 % (08/18 0543) Weight:  [136 lb 0.4 oz (61.7 kg)] 136 lb 0.4 oz (61.7 kg) (08/18 0500) Weight change: 5 lb 8.2 oz (2.5 kg)  Intake/Output from previous day: 08/17 0701 - 08/18 0700 In: 650 [I.V.:650] Out: 2600 [Urine:2600] Intake/Output this shift:    General appearance: awake today. Able to answer few questions.  Resp: clear to auscultation bilaterally Chest wall: no tenderness Cardio: regular rate and rhythm, S1, S2 normal, no murmur, click, rub or gallop GI: soft, non-tender; bowel sounds normal; no masses,  no organomegaly Extremities: hyperpigmented swollen digits with mild tenderness on L  Lab Results:  Recent  Labs  12/01/14 0459 12/03/14 0349  WBC 12.2* 10.9*  HGB 10.3* 9.5*  HCT 30.7* 28.2*  PLT 513* 488*   BMET:   Recent Labs  12/02/14 0806 12/03/14 0349  NA 140 148*  K 3.5 3.4*  CL 111 116*  CO2 20* 23  GLUCOSE 90 89  BUN 22* 19  CREATININE 5.07* 4.58*  CALCIUM 8.0* 8.5*   No results for input(s): PTH in the last 72 hours. Iron Studies: No results for input(s): IRON, TIBC, TRANSFERRIN, FERRITIN in the last 72 hours. Studies/Results: No results found.  Scheduled: . amLODipine  2.5 mg Oral Daily  . enoxaparin (LOVENOX) injection  30 mg Subcutaneous Q24H  . feeding supplement (NEPRO CARB STEADY)  237 mL Oral TID WC  . folic acid  1 mg Oral Daily  . multivitamin with minerals  1 tablet Oral Daily  . nicotine  21 mg Transdermal Daily  . potassium chloride  40 mEq Oral Once  . sodium bicarbonate  650 mg Oral TID  . sodium chloride  3 mL Intravenous Q12H  . thiamine  100 mg Oral Daily    LOS: 6 days   [name], [name] 12/03/2014,11:26 AM

## 2014-12-03 NOTE — Progress Notes (Signed)
[place] TEAM 1 - Stepdown/ICU TEAM Progress Note  Leon Foster LEX:[id number] DOB: 1941-02-22 DOA: 11/27/2014 PCP: Philis Fendt, MD  Admit HPI / Brief Narrative: 74 y.o. BM single, lives alone, PMHx of heavy alcohol tobacco abuse since age 23 (,smokes 1ppd. He reports drinking two bottles of wine )  Evaluated at urgent care center and by vascular surgery/Dr. Ruta Hinds on 11/26/14 for gangrenous left second toe and labs which were drawn yesterday showed acute renal failure with creatinine 5.27 and family was advised to bring patient to [facility] ED. Patient is unable to provide any history secondary to confusion. History is obtained from patient's brother and 2 sisters at bedside. According to them, patient has been complaining of left foot pain for approximately a year which progressively got worse in the last 2 weeks. One of the sisters looked at his foot for the first time 3 nights ago and noticed black discoloration of second left toe. He was taken to urgent care center yesterday and then evaluated by vascular surgery who planned for an aortogram and possible intervention on 11/30/14. At this time patient is not complaining of foot pain. He does not know why he is in the hospital. According to family, he has intermittent urinary and fecal incontinence. He apparently had some "shakes" yesterday. Apart from this, family denies any other complaints. No reported chest pain, dyspnea, cough, fever, chills, nausea or vomiting. In the ED, repeat labs show creatinine of 7.20 (normal creatinine of 1.06 on 08/20/14), lactate 2.06, normal ammonia and INR, hemoglobin 11.6 WBC 12.6, chest x-ray with emphysema but no acute findings and left foot x-ray shows no evidence of osteomyelitis. Patient was empirically started on IV vancomycin and Zosyn for presumed cellulitis of left toe/foot. Hospitalist admission was requested.    HPI/Subjective: 8/16  A/O 4, increasing pain in left foot. follows all commands.     Assessment/Plan: Dry gangrene left second metatarsal/sepsis unspecified organism? -Per vascular surgeryAortogram with bilateral runoff and possible intervention with Dr. Oneida Alar on Monday 11/30/14. Discussed that if he has extensive disease not amenable to percutaneous intervention, may require bypass or AKA  vs BKA  -8/18 vascular surgery note from yesterday states will recheck patient's Cr on Friday to decide when surgery appropriate. -Blood cultures NGTD -Urine cultures negative  Acute renal failure(Normal Cr 1.06 on 08/20/14). -Admitting creatinine 7.2 on 11/27/14.  -8/18  Cr = 4.58  -Continue D5W at 70ml/hr -Strict I&O since admission  + 4.1 L -Renal ultrasound; no obstruction see results below -Patient with continued poor output; nephrology will decide if/when HD required -Avoid nephrotoxic medication  Dehydration -Continue IV fluids D5W 50 mL/hr; patient still not consuming significant amounts of fluids/nutrition  Pulmonary hypertension -Echocardiogram verifies pulmonary hypertension see results below.  Hypokalemia -K-Dur 40 mEq 1  Tobacco abuse:  -Continue nicotine patch  Alcohol abuse -continue CIWA protocol COPD: Stable.  Altered mental status  -Cleared    Code Status: FULL Family Communication: sister  present at time of exam Disposition Plan: Vascular surgery    Consultants: Dr.Michael Mercy Moore (nephrology) Dr.Jahmai E Fields (vascular surgery)   Procedure/Significant Events: 8/12 renal ultrasound;Small echogenic foci w/i left kidney small nonobstructing stones. 8/15 echocardiogram;- LVEF=60%. Wall motion normal; there were no regional wall motion abnormalities. - Pulmonary arteries: PA peak pressure: 40 mm Hg (S).   Culture is in a 8/12 urine negative 8/12 MRSA by PCR negative 8/13 urine negative 8/13 blood left/right hand NGTD    Antibiotics: Zosyn 8/12>> stopped 8/12 Vancomycin 8/12> stopped 8/12  DVT  prophylaxis: Heparin   Devices    LINES / TUBES:      Continuous Infusions: . dextrose 50 mL/hr at 12/03/14 0745    Objective: VITAL SIGNS: Temp: 97.9 F (36.6 C) (08/18 0800) Temp Source: Oral (08/18 0800) BP: 163/97 mmHg (08/18 0800) Pulse Rate: 94 (08/18 0800) SPO2; FIO2:   Intake/Output Summary (Last 24 hours) at 12/03/14 0944 Last data filed at 12/03/14 0650  Gross per 24 hour  Intake    650 ml  Output   2600 ml  Net  -1950 ml     Exam: General:  A/O 4, follows all commands.. cachectic, unkempt, No acute respiratory distress Eyes: negative scleral hemorrhage ENT: Negative Runny nose, negative gingival bleeding, poor dentation. Neck:  Negative scars, masses, torticollis, lymphadenopathy, JVD Lungs: Clear to auscultation bilaterally without wheezes or crackles Cardiovascular: Regular rhythm and rate without murmur gallop or rub normal S1 and S2 Abdomen:negative abdominal pain, negative dysphagia, nondistended, positive soft, bowel sounds, no rebound, no ascites, no appreciable mass Extremities: No significant cyanosis, clubbing, or edema on the RLE. However LLE DP/PT pulse not palpable, foot cool to touch, metatarsals 1 through 4 dusky, painful to the touch (pain to palpation is now also medial aspect of left foot); the worst remains first and second metatarsal followed by the fifth metatarsal. Discoloration of all toes worsening. Psychiatric: Unable to assess      Data Reviewed: Basic Metabolic Panel:  Recent Labs Lab 11/27/14 1000  11/28/14 0249 11/28/14 1529 11/29/14 0329 11/30/14 0305 12/01/14 0459 12/02/14 0806 12/03/14 0349  NA  --   < > 138 140 139 141 142 140 148*  K  --   < > 3.4* 3.3* 4.2 3.7 4.0 3.5 3.4*  CL  --   < > 105 108 109 114* 112* 111 116*  CO2  --   < > 22 20* 20* 18* 19* 20* 23  GLUCOSE  --   < > 91 121* 84 95 83 90 89  BUN  --   < > 21* 21* 21* 22* 22* 22* 19  CREATININE  --   < > 6.91* 6.88* 6.95* 7.02* 6.45* 5.07* 4.58*   CALCIUM  --   < > 7.6* 6.9* 7.7* 7.7* 8.4* 8.0* 8.5*  MG 1.5*  --   --   --  2.0 1.9  --   --   --   PHOS  --   --  4.4 3.9 4.5  --   --   --   --   < > = values in this interval not displayed. Liver Function Tests:  Recent Labs Lab 11/27/14 1012  11/28/14 1529 11/29/14 0329 11/30/14 0305 12/01/14 0459 12/03/14 0349  AST 17  --   --  15 16 18  14*  ALT 5*  --   --  6* 6* 8* 8*  ALKPHOS 69  --   --  61 55 65 56  BILITOT 0.6  --   --  0.5 0.3 0.3 0.3  PROT 6.5  --   --  5.6* 5.6* 6.3* 6.1*  ALBUMIN 2.4*  < > 1.9* 1.8* 1.7* 1.9* 2.1*  < > = values in this interval not displayed. No results for input(s): LIPASE, AMYLASE in the last 168 hours.  Recent Labs Lab 11/27/14 1043  AMMONIA 26   CBC:  Recent Labs Lab 11/27/14 1012  11/28/14 1529 11/29/14 0329 11/30/14 0305 12/01/14 0459 12/03/14 0349  WBC 12.6*  < > 14.3* 14.2* 12.0* 12.2*  10.9*  NEUTROABS 10.0*  --   --  10.5* 8.7*  --   --   HGB 11.6*  < > 10.6* 10.0* 9.7* 10.3* 9.5*  HCT 34.5*  < > 31.2* 30.2* 28.7* 30.7* 28.2*  MCV 96.1  < > 95.7 96.5 95.3 93.9 93.1  PLT 322  < > 356 317 379 513* 488*  < > = values in this interval not displayed. Cardiac Enzymes: No results for input(s): CKTOTAL, CKMB, CKMBINDEX, TROPONINI in the last 168 hours. BNP (last 3 results) No results for input(s): BNP in the last 8760 hours.  ProBNP (last 3 results) No results for input(s): PROBNP in the last 8760 hours.  CBG: No results for input(s): GLUCAP in the last 168 hours.  Recent Results (from the past 240 hour(s))  Urine culture     Status: None   Collection Time: 11/27/14 12:24 PM  Result Value Ref Range Status   Specimen Description URINE, RANDOM  Final   Special Requests NONE  Final   Culture NO GROWTH 1 DAY  Final   Report Status 11/28/2014 FINAL  Final  MRSA PCR Screening     Status: None   Collection Time: 11/27/14  5:27 PM  Result Value Ref Range Status   MRSA by PCR NEGATIVE NEGATIVE Final    Comment:        The  GeneXpert MRSA Assay (FDA approved for NASAL specimens only), is one component of a comprehensive MRSA colonization surveillance program. It is not intended to diagnose MRSA infection nor to guide or monitor treatment for MRSA infections.   Culture, Urine     Status: None   Collection Time: 11/28/14 12:12 PM  Result Value Ref Range Status   Specimen Description URINE, CATHETERIZED  Final   Special Requests NONE  Final   Culture NO GROWTH 1 DAY  Final   Report Status 11/29/2014 FINAL  Final  C difficile quick scan w PCR reflex     Status: None   Collection Time: 11/28/14  2:31 PM  Result Value Ref Range Status   C Diff antigen NEGATIVE NEGATIVE Final   C Diff toxin NEGATIVE NEGATIVE Final   C Diff interpretation Negative for toxigenic C. difficile  Final  Culture, blood (routine x 2)     Status: None (Preliminary result)   Collection Time: 11/28/14  2:55 PM  Result Value Ref Range Status   Specimen Description BLOOD LEFT HAND  Final   Special Requests BOTTLES DRAWN AEROBIC AND ANAEROBIC 10CC  Final   Culture NO GROWTH 4 DAYS  Final   Report Status PENDING  Incomplete  Culture, blood (routine x 2)     Status: None (Preliminary result)   Collection Time: 11/28/14  3:10 PM  Result Value Ref Range Status   Specimen Description BLOOD RIGHT HAND  Final   Special Requests BOTTLES DRAWN AEROBIC AND ANAEROBIC 10CC  Final   Culture NO GROWTH 4 DAYS  Final   Report Status PENDING  Incomplete     Studies:  Recent x-ray studies have been reviewed in detail by the Attending Physician  Scheduled Meds:  Scheduled Meds: . amLODipine  2.5 mg Oral Daily  . enoxaparin (LOVENOX) injection  30 mg Subcutaneous Q24H  . feeding supplement (NEPRO CARB STEADY)  237 mL Oral TID WC  . folic acid  1 mg Oral Daily  . multivitamin with minerals  1 tablet Oral Daily  . nicotine  21 mg Transdermal Daily  . sodium bicarbonate  650 mg  Oral TID  . sodium chloride  3 mL Intravenous Q12H  . thiamine   100 mg Oral Daily    Time spent on care of this patient: 40 mins   Teandre Hamre, Geraldo Docker , MD  [facility] Office  [phone] Pager - [phone]  On-Call/Text Page:      [email or web address]      password [email or web address]  If 7PM-7AM, please contact night-coverage [email or web address] Password [email or web address] [email or web address] 12/03/2014, 9:44 AM   LOS: 6 days   Care during the described time interval was provided by me .  I have reviewed this patient's available data, including medical history, events of note, physical examination, and all test results as part of my evaluation. I have personally reviewed and interpreted all radiology studies.   Dia Crawford, MD [phone] Pager

## 2014-12-03 NOTE — Care Management Important Message (Signed)
Important Message  Patient Details  Name: Leon Foster MRN: [id number] Date of Birth: 10-05-1940   Medicare Important Message Given:  Yes-third notification given    Girard Cooter, RN 12/03/2014, 2:09 PM

## 2014-12-04 NOTE — Consult Note (Signed)
[facility]  Subjective  - feels better  Objective 170/94 94 98.2 F (36.8 C) (Oral) 18 98%  Intake/Output Summary (Last 24 hours) at 12/04/14 1053 Last data filed at 12/04/14 0900  Gross per 24 hour  Intake 1232.5 ml  Output   1700 ml  Net -467.5 ml   Dry gangrene tips of 4 toes left foot  Assessment/Planning: Severe protein calorie malnutrition Albumin just now over 2.  Continue to supplement Dry gangrene tips of 4 toes left foot.  Angiogram on hold until renal function stabilizes.  Protect foot for now.  If develops ascending infection prior to renal recovery will need BKA/AKA.    Will recheck next week  Onnie, [place] 12/04/2014 10:53 AM --  Laboratory Lab Results:  Recent Labs  12/03/14 0349  WBC 10.9*  HGB 9.5*  HCT 28.2*  PLT 488*   BMET  Recent Labs  12/02/14 0806 12/03/14 0349  NA 140 148*  K 3.5 3.4*  CL 111 116*  CO2 20* 23  GLUCOSE 90 89  BUN 22* 19  CREATININE 5.07* 4.58*  CALCIUM 8.0* 8.5*    COAG Lab Results  Component Value Date   INR 1.10 11/27/2014   No results found for: PTT

## 2014-12-04 NOTE — Progress Notes (Addendum)
[place] TEAM 2  Progress Note  Leon Foster SNK:[id number] DOB: 1940/09/22 DOA: 11/27/2014 PCP: Philis Fendt, MD  Admit HPI / Brief Narrative: 74 y.o. M who lives alone w/ a Hx of heavy alcohol (2 bottles of wine/day) and tobacco abuse (1ppd since age 40) who was evaluated by Vascular Surgery/Dr. Ruta Hinds on 11/26/14.  During his w/u labs showed creatinine 5.27 and family was advised to bring patient to [facility] ED. According to family patient had been complaining of left foot pain for approximately a year which progressively got worse over the preceding 2 weeks. One of his sisters looked at his foot and noticed black discoloration of the second left toe. He was taken to urgent care center and then evaluated by vascular surgery who planned for an aortogram and possible intervention on 11/30/14.   In the ED, repeat labs noted creatinine of 7.20 (normal creatinine of 1.06 on 08/20/14), lactate 2.06, normal ammonia and INR, hemoglobin 11.6 WBC 12.6, chest x-ray with emphysema but no acute findings and left foot x-ray w/ no evidence of osteomyelitis. Patient was empirically started on IV vancomycin and Zosyn for presumed cellulitis of left toe/foot.   HPI/Subjective: Pt has no new complaints.  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  Dehydration - currently on fluids  Hypokalemia - follow trend - 3.4 on last check. Only mild.   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 versus simple withdrawal symptoms - follow clinically - P37 and folic acid normal - RPR nonreactive  Code Status: FULL Family Communication: No family present at time of exam today Disposition Plan: If remains stable overnight we'll consider transfer to renal floor 8/18 - continue to follow renal function with eventual plan for vascular surgery  Consultants: Nephrology Vascular surgery  Procedure/Significant Events: 8/12 renal ultrasound Small echogenic foci w/i left kidney small nonobstructing stones  Antibiotics: Zosyn 8/12  Vancomycin 8/12   DVT prophylaxis: lovenox  Objective: Blood pressure 170/94, pulse 94, temperature 98.2 F (36.8 C), temperature source Oral, resp. rate 18, height 5\' 7"  (1.702 m), weight 60 kg (132 lb 4.4 oz), SpO2 98 %.  Intake/Output Summary (Last 24 hours) at 12/04/14 1718 Last data filed at 12/04/14 1300  Gross per 24 hour  Intake 1395.83 ml  Output   1000 ml  Net 395.83 ml   Exam: General: Alert and awake, in NAD Lungs: Clear to auscultation bilaterally without wheezes or crackles Cardiovascular: Regular rate and rhythm without murmur gallop or rub Abdomen: Nontender, nondistended, soft, bowel sounds positive, no rebound, no ascites, no appreciable mass Extremities: No significant clubbing, or edema bilateral lower extremities - L 2nd toe black and appears non-vital - L great toe darker than other toes - no purulent d/c - no erythema - no clinical evidence of cellulitis - exam without change today compared to my last exam 11/30/14  Data Reviewed: Basic Metabolic Panel:  Recent Labs Lab 11/28/14 0249 11/28/14 1529 11/29/14 0329 11/30/14 0305 12/01/14 0459 12/02/14 0806 12/03/14 0349  NA  138 140 139 141 142 140 148*  K 3.4* 3.3* 4.2 3.7 4.0 3.5 3.4*  CL 105 108 109 114* 112* 111 116*  CO2 22 20* 20* 18* 19* 20* 23  GLUCOSE 91 121* 84 95 83 90 89  BUN 21* 21* 21* 22* 22* 22* 19  CREATININE  6.91* 6.88* 6.95* 7.02* 6.45* 5.07* 4.58*  CALCIUM 7.6* 6.9* 7.7* 7.7* 8.4* 8.0* 8.5*  MG  --   --  2.0 1.9  --   --   --   PHOS 4.4 3.9 4.5  --   --   --   --    Liver Function Tests:  Recent Labs Lab 11/28/14 1529 11/29/14 0329 11/30/14 0305 12/01/14 0459 12/03/14 0349  AST  --  15 16 18  14*  ALT  --  6* 6* 8* 8*  ALKPHOS  --  61 55 65 56  BILITOT  --  0.5 0.3 0.3 0.3  PROT  --  5.6* 5.6* 6.3* 6.1*  ALBUMIN 1.9* 1.8* 1.7* 1.9* 2.1*   No results for input(s): AMMONIA in the last 168 hours. CBC:  Recent Labs Lab 11/28/14 1529 11/29/14 0329 11/30/14 0305 12/01/14 0459 12/03/14 0349  WBC 14.3* 14.2* 12.0* 12.2* 10.9*  NEUTROABS  --  10.5* 8.7*  --   --   HGB 10.6* 10.0* 9.7* 10.3* 9.5*  HCT 31.2* 30.2* 28.7* 30.7* 28.2*  MCV 95.7 96.5 95.3 93.9 93.1  PLT 356 317 379 513* 488*    Recent Results (from the past 240 hour(s))  Urine culture     Status: None   Collection Time: 11/27/14 12:24 PM  Result Value Ref Range Status   Specimen Description URINE, RANDOM  Final   Special Requests NONE  Final   Culture NO GROWTH 1 DAY  Final   Report Status 11/28/2014 FINAL  Final  MRSA PCR Screening     Status: None   Collection Time: 11/27/14  5:27 PM  Result Value Ref Range Status   MRSA by PCR NEGATIVE NEGATIVE Final    Comment:        The GeneXpert MRSA Assay (FDA approved for NASAL specimens only), is one component of a comprehensive MRSA colonization surveillance program. It is not intended to diagnose MRSA infection nor to guide or monitor treatment for MRSA infections.   Culture, Urine     Status: None   Collection Time: 11/28/14 12:12 PM  Result Value Ref Range Status   Specimen Description URINE, CATHETERIZED  Final   Special Requests NONE  Final   Culture NO GROWTH 1 DAY  Final   Report Status 11/29/2014 FINAL  Final  C difficile quick scan w PCR reflex     Status: None   Collection Time: 11/28/14  2:31 PM  Result Value Ref Range Status   C Diff  antigen NEGATIVE NEGATIVE Final   C Diff toxin NEGATIVE NEGATIVE Final   C Diff interpretation Negative for toxigenic C. difficile  Final  Culture, blood (routine x 2)     Status: None   Collection Time: 11/28/14  2:55 PM  Result Value Ref Range Status   Specimen Description BLOOD LEFT HAND  Final   Special Requests BOTTLES DRAWN AEROBIC AND ANAEROBIC 10CC  Final   Culture NO GROWTH 5 DAYS  Final   Report Status 12/03/2014 FINAL  Final  Culture, blood (routine x 2)     Status: None   Collection Time: 11/28/14  3:10 PM  Result Value Ref Range Status  Specimen Description BLOOD RIGHT HAND  Final   Special Requests BOTTLES DRAWN AEROBIC AND ANAEROBIC 10CC  Final   Culture NO GROWTH 5 DAYS  Final   Report Status 12/03/2014 FINAL  Final     Studies:  Recent x-ray studies have been reviewed in detail by the Attending Physician  Scheduled Meds:  Scheduled Meds: . amLODipine  2.5 mg Oral Daily  . enoxaparin (LOVENOX) injection  30 mg Subcutaneous Q24H  . feeding supplement (NEPRO CARB STEADY)  237 mL Oral TID WC  . folic acid  1 mg Oral Daily  . multivitamin with minerals  1 tablet Oral Daily  . nicotine  21 mg Transdermal Daily  . sodium bicarbonate  650 mg Oral TID  . sodium chloride  3 mL Intravenous Q12H  . thiamine  100 mg Oral Daily    Time spent on care of this patient: 25 mins  Addendum: Velvet Bathe, MD [facility] For Consults/Admissions - Flow Manager [phone] Office  [phone]  Contact MD directly via text page:      [email or web address]      password [facility]  12/04/2014, 5:18 PM   LOS: 7 days

## 2014-12-05 ENCOUNTER — Other Ambulatory Visit: Payer: Self-pay

## 2014-12-05 MED ORDER — METOPROLOL TARTRATE 12.5 MG HALF TABLET
12.5000 mg | ORAL_TABLET | Freq: Two times a day (BID) | ORAL | Status: DC
  Administered 2014-12-05 – 2014-12-15 (×21): 12.5 mg via ORAL
  Filled 2014-12-05 (×21): qty 1

## 2014-12-05 NOTE — Progress Notes (Signed)
[place] TEAM 2  Progress Note  Leon Foster BOF:[id number] DOB: [birth year] DOA: 11/27/2014 PCP: Philis Fendt, MD  Admit HPI / Brief Narrative: 74 y.o. M who lives alone w/ a Hx of heavy alcohol (2 bottles of wine/day) and tobacco abuse (1ppd since age 16) who was evaluated by Vascular Surgery/Dr. Ruta Hinds on 11/26/14.  During his w/u labs showed creatinine 5.27 and family was advised to bring patient to [facility] ED. According to family patient had been complaining of left foot pain for approximately a year which progressively got worse over the preceding 2 weeks. One of his sisters looked at his foot and noticed black discoloration of the second left toe. He was taken to urgent care center and then evaluated by vascular surgery who planned for an aortogram and possible intervention on 11/30/14.   In the ED, repeat labs noted creatinine of 7.20 (normal creatinine of 1.06 on 08/20/14), lactate 2.06, normal ammonia and INR, hemoglobin 11.6 WBC 12.6, chest x-ray with emphysema but no acute findings and left foot x-ray w/ no evidence of osteomyelitis. Patient was empirically started on IV vancomycin and Zosyn for presumed cellulitis of left toe/foot.   HPI/Subjective: Pt has no new complaints.  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  Dehydration - currently on fluids  Hypokalemia - follow trend - 3.4 on last check. Only mild.  - reassess next am.  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 versus simple withdrawal symptoms - follow clinically - D78 and folic acid normal - RPR nonreactive  Code Status: FULL Family Communication: No family present at time of exam today Disposition Plan: If remains stable overnight we'll consider transfer to renal floor 8/18 - continue to follow renal function with eventual plan for vascular surgery  Consultants: Nephrology Vascular surgery  Procedure/Significant Events: 8/12 renal ultrasound Small echogenic foci w/i left kidney small nonobstructing stones  Antibiotics: Zosyn 8/12  Vancomycin 8/12   DVT prophylaxis: lovenox  Objective: Blood pressure 121/79, pulse 100, temperature 98.2 F (36.8 C), temperature source Oral, resp. rate 18, height 5\' 7"  (1.702 m), weight 57.425 kg (126 lb 9.6 oz), SpO2 100 %.  Intake/Output Summary (Last 24 hours) at 12/05/14 1652 Last data filed at 12/05/14 0600  Gross per 24 hour  Intake   1680 ml  Output   1077 ml  Net    603 ml   Exam: General: Alert and awake, in NAD Lungs: Clear to auscultation bilaterally without wheezes or crackles Cardiovascular: Regular rate and rhythm without murmur gallop or rub Abdomen: Nontender, nondistended, soft, bowel sounds positive, no rebound, no ascites, no appreciable mass Extremities: No significant clubbing, or edema bilateral lower extremities - L 2nd toe black and appears non-vital - L great toe darker than other toes - no purulent d/c - no erythema - no clinical evidence of cellulitis - exam without change today compared to my last exam 11/30/14  Data Reviewed: Basic Metabolic Panel:  Recent Labs Lab 11/29/14 0329 11/30/14 0305 12/01/14 0459 12/02/14  6962 12/03/14 0349  NA 139 141 142 140 148*  K 4.2 3.7 4.0 3.5 3.4*  CL 109 114* 112* 111 116*  CO2 20* 18* 19* 20* 23  GLUCOSE 84 95 83 90 89  BUN 21* 22* 22* 22* 19  CREATININE 6.95* 7.02* 6.45* 5.07* 4.58*  CALCIUM 7.7* 7.7* 8.4*  8.0* 8.5*  MG 2.0 1.9  --   --   --   PHOS 4.5  --   --   --   --    Liver Function Tests:  Recent Labs Lab 11/29/14 0329 11/30/14 0305 12/01/14 0459 12/03/14 0349  AST 15 16 18  14*  ALT 6* 6* 8* 8*  ALKPHOS 61 55 65 56  BILITOT 0.5 0.3 0.3 0.3  PROT 5.6* 5.6* 6.3* 6.1*  ALBUMIN 1.8* 1.7* 1.9* 2.1*   No results for input(s): AMMONIA in the last 168 hours. CBC:  Recent Labs Lab 11/29/14 0329 11/30/14 0305 12/01/14 0459 12/03/14 0349  WBC 14.2* 12.0* 12.2* 10.9*  NEUTROABS 10.5* 8.7*  --   --   HGB 10.0* 9.7* 10.3* 9.5*  HCT 30.2* 28.7* 30.7* 28.2*  MCV 96.5 95.3 93.9 93.1  PLT 317 379 513* 488*    Recent Results (from the past 240 hour(s))  Urine culture     Status: None   Collection Time: 11/27/14 12:24 PM  Result Value Ref Range Status   Specimen Description URINE, RANDOM  Final   Special Requests NONE  Final   Culture NO GROWTH 1 DAY  Final   Report Status 11/28/2014 FINAL  Final  MRSA PCR Screening     Status: None   Collection Time: 11/27/14  5:27 PM  Result Value Ref Range Status   MRSA by PCR NEGATIVE NEGATIVE Final    Comment:        The GeneXpert MRSA Assay (FDA approved for NASAL specimens only), is one component of a comprehensive MRSA colonization surveillance program. It is not intended to diagnose MRSA infection nor to guide or monitor treatment for MRSA infections.   Culture, Urine     Status: None   Collection Time: 11/28/14 12:12 PM  Result Value Ref Range Status   Specimen Description URINE, CATHETERIZED  Final   Special Requests NONE  Final   Culture NO GROWTH 1 DAY  Final   Report Status 11/29/2014 FINAL  Final  C difficile quick scan w PCR reflex     Status: None   Collection Time: 11/28/14  2:31 PM  Result Value Ref Range Status   C Diff antigen NEGATIVE NEGATIVE Final   C Diff toxin NEGATIVE NEGATIVE Final   C Diff interpretation Negative for toxigenic C. difficile  Final  Culture, blood (routine x 2)     Status: None    Collection Time: 11/28/14  2:55 PM  Result Value Ref Range Status   Specimen Description BLOOD LEFT HAND  Final   Special Requests BOTTLES DRAWN AEROBIC AND ANAEROBIC 10CC  Final   Culture NO GROWTH 5 DAYS  Final   Report Status 12/03/2014 FINAL  Final  Culture, blood (routine x 2)     Status: None   Collection Time: 11/28/14  3:10 PM  Result Value Ref Range Status   Specimen Description BLOOD RIGHT HAND  Final   Special Requests BOTTLES DRAWN AEROBIC AND ANAEROBIC 10CC  Final   Culture NO GROWTH 5 DAYS  Final   Report Status 12/03/2014 FINAL  Final     Studies:  Recent x-ray studies have  been reviewed in detail by the Attending Physician  Scheduled Meds:  Scheduled Meds: . amLODipine  2.5 mg Oral Daily  . enoxaparin (LOVENOX) injection  30 mg Subcutaneous Q24H  . feeding supplement (NEPRO CARB STEADY)  237 mL Oral TID WC  . folic acid  1 mg Oral Daily  . metoprolol tartrate  12.5 mg Oral BID  . multivitamin with minerals  1 tablet Oral Daily  . nicotine  21 mg Transdermal Daily  . sodium bicarbonate  650 mg Oral TID  . sodium chloride  3 mL Intravenous Q12H  . thiamine  100 mg Oral Daily    Time spent on care of this patient: 25 mins  Velvet Bathe, MD [facility] For Consults/Admissions - Flow Manager [phone] Office  [phone]  Contact MD directly via text page:      [email or web address]      password [facility]  12/05/2014, 4:52 PM   LOS: 8 days

## 2014-12-05 NOTE — Progress Notes (Signed)
Run SVT around 6am, another run SVT at 7:42, 14 beat run VTach at 8:32. Patient asymptomatic. BP 121/79 HR 100. MD notified. Will continue to monitor.

## 2014-12-06 ENCOUNTER — Inpatient Hospital Stay (HOSPITAL_COMMUNITY): Payer: Medicare Other

## 2014-12-06 DIAGNOSIS — Z01810 Encounter for preprocedural cardiovascular examination: Secondary | ICD-10-CM

## 2014-12-06 LAB — BASIC METABOLIC PANEL
ANION GAP: 10 (ref 5–15)
BUN: 19 mg/dL (ref 6–20)
CHLORIDE: 103 mmol/L (ref 101–111)
CO2: 27 mmol/L (ref 22–32)
Calcium: 7.8 mg/dL — ABNORMAL LOW (ref 8.9–10.3)
Creatinine, Ser: 2.36 mg/dL — ABNORMAL HIGH (ref 0.61–1.24)
GFR calc non Af Amer: 26 mL/min — ABNORMAL LOW (ref >60)
GFR, EST AFRICAN AMERICAN: 30 mL/min — AB (ref >60)
GLUCOSE: 92 mg/dL (ref 65–99)
POTASSIUM: 3.1 mmol/L — AB (ref 3.5–5.1)
Sodium: 140 mmol/L (ref 135–145)

## 2014-12-06 LAB — CBC
HEMATOCRIT: 27 % — AB (ref 39.0–52.0)
HEMOGLOBIN: 9.1 g/dL — AB (ref 13.0–17.0)
MCH: 31.5 pg (ref 26.0–34.0)
MCHC: 33.7 g/dL (ref 30.0–36.0)
MCV: 93.4 fL (ref 78.0–100.0)
Platelets: 368 10*3/uL (ref 150–400)
RBC: 2.89 MIL/uL — AB (ref 4.22–5.81)
RDW: 18.4 % — ABNORMAL HIGH (ref 11.5–15.5)
WBC: 10.8 10*3/uL — ABNORMAL HIGH (ref 4.0–10.5)

## 2014-12-06 MED ORDER — POTASSIUM CHLORIDE CRYS ER 20 MEQ PO TBCR
40.0000 meq | EXTENDED_RELEASE_TABLET | Freq: Once | ORAL | Status: AC
  Administered 2014-12-06: 40 meq via ORAL
  Filled 2014-12-06: qty 2

## 2014-12-06 NOTE — Progress Notes (Signed)
[place] TEAM 2  Progress Note  Leon Foster OZD:[id number] DOB: [birth year] DOA: 11/27/2014 PCP: Philis Fendt, MD  Admit HPI / Brief Narrative: 74 y.o. M who lives alone w/ a Hx of heavy alcohol (2 bottles of wine/day) and tobacco abuse (1ppd since age 28) who was evaluated by Vascular Surgery/Dr. Ruta Hinds on 11/26/14.  During his w/u labs showed creatinine 5.27 and family was advised to bring patient to [facility] ED. According to family patient had been complaining of left foot pain for approximately a year which progressively got worse over the preceding 2 weeks. One of his sisters looked at his foot and noticed black discoloration of the second left toe. He was taken to urgent care center and then evaluated by vascular surgery who planned for an aortogram and possible intervention on 11/30/14.   In the ED, repeat labs noted creatinine of 7.20 (normal creatinine of 1.06 on 08/20/14), lactate 2.06, normal ammonia and INR, hemoglobin 11.6 WBC 12.6, chest x-ray with emphysema but no acute findings and left foot x-ray w/ no evidence of osteomyelitis. Patient was empirically started on IV vancomycin and Zosyn for presumed cellulitis of left toe/foot.   HPI/Subjective: Pt has no new complaints.  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 Plan will be to continue fluids at 50 mL/h  Dehydration - currently on fluids, resolving  Hypokalemia - We'll administer Kdur and reassess next am. - reassess next am.  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 versus  simple withdrawal symptoms - follow clinically - Q59 and folic acid normal - RPR nonreactive  Code Status: FULL Family Communication: No family present at time of exam today Disposition Plan: If remains stable overnight we'll consider transfer to renal floor 8/18 - continue to follow renal function with eventual plan for vascular surgery  Consultants: Nephrology Vascular surgery  Procedure/Significant Events: 8/12 renal ultrasound Small echogenic foci w/i left kidney small nonobstructing stones  Antibiotics: Zosyn 8/12  Vancomycin 8/12   DVT prophylaxis: lovenox  Objective: Blood pressure 144/86, pulse 89, temperature 98.5 F (36.9 C), temperature source Oral, resp. rate 16, height 5\' 7"  (1.702 m), weight 58.514 kg (129 lb), SpO2 100 %.  Intake/Output Summary (Last 24 hours) at 12/06/14 1723 Last data filed at 12/06/14 1341  Gross per 24 hour  Intake   1800 ml  Output   1353 ml  Net    447 ml   Exam: General: Alert and awake, in NAD Lungs: Clear to auscultation bilaterally without wheezes or crackles Cardiovascular: Regular rate and rhythm without murmur gallop or rub Abdomen: Nontender, nondistended, soft, bowel sounds positive, no rebound, no ascites, no appreciable mass Extremities: No significant clubbing, or edema bilateral lower extremities - L 2nd toe black and appears non-vital - L great toe darker than other toes - no purulent d/c - no erythema - no clinical evidence of cellulitis - exam without change today compared to my last exam 11/30/14  Data Reviewed: Basic Metabolic Panel:  Recent Labs Lab 11/30/14 0305 12/01/14 0459 12/02/14 0806 12/03/14 0349 12/06/14 0542  NA 141 142 140 148* 140  K 3.7 4.0 3.5 3.4* 3.1*  CL 114* 112* 111 116* 103  CO2 18* 19* 20* 23 27  GLUCOSE 95 83 90 89 92  BUN 22* 22* 22* 19 19  CREATININE 7.02* 6.45* 5.07* 4.58* 2.36*  CALCIUM 7.7* 8.4* 8.0* 8.5* 7.8*  MG 1.9  --   --   --   --    Liver Function Tests:  Recent  Labs Lab 11/30/14 0305 12/01/14 0459 12/03/14 0349  AST 16 18 14*  ALT 6* 8* 8*  ALKPHOS 55 65 56  BILITOT 0.3 0.3 0.3  PROT 5.6* 6.3* 6.1*  ALBUMIN 1.7* 1.9* 2.1*   No results for input(s): AMMONIA in the last 168 hours. CBC:  Recent Labs Lab 11/30/14 0305 12/01/14 0459 12/03/14 0349 12/06/14 0542  WBC 12.0* 12.2* 10.9* 10.8*  NEUTROABS 8.7*  --   --   --   HGB 9.7* 10.3* 9.5* 9.1*  HCT 28.7* 30.7* 28.2* 27.0*  MCV 95.3 93.9 93.1 93.4  PLT 379 513* 488* 368    Recent Results (from the past 240 hour(s))  Urine culture     Status: None   Collection Time: 11/27/14 12:24 PM  Result Value Ref Range Status   Specimen Description URINE, RANDOM  Final   Special Requests NONE  Final   Culture NO GROWTH 1 DAY  Final   Report Status 11/28/2014 FINAL  Final  MRSA PCR Screening     Status: None   Collection Time: 11/27/14  5:27 PM  Result Value Ref Range Status   MRSA by PCR NEGATIVE NEGATIVE Final    Comment:        The GeneXpert MRSA Assay (FDA approved for NASAL specimens only), is one component of a comprehensive MRSA colonization surveillance program. It is not intended to diagnose MRSA infection nor to guide or monitor treatment for MRSA infections.   Culture, Urine     Status: None   Collection Time: 11/28/14 12:12 PM  Result Value Ref Range Status   Specimen Description URINE, CATHETERIZED  Final   Special Requests NONE  Final   Culture NO GROWTH 1 DAY  Final   Report Status 11/29/2014 FINAL  Final  C difficile quick scan w PCR reflex     Status: None   Collection Time: 11/28/14  2:31 PM  Result Value Ref Range Status   C Diff antigen NEGATIVE NEGATIVE Final   C Diff toxin NEGATIVE NEGATIVE Final   C Diff interpretation Negative for toxigenic C. difficile  Final  Culture, blood (routine x 2)     Status: None   Collection Time: 11/28/14  2:55 PM  Result Value Ref Range Status   Specimen Description BLOOD LEFT HAND  Final   Special Requests BOTTLES DRAWN  AEROBIC AND ANAEROBIC 10CC  Final   Culture NO GROWTH 5 DAYS  Final   Report Status 12/03/2014 FINAL  Final  Culture, blood (routine x 2)     Status: None   Collection Time: 11/28/14  3:10 PM  Result Value Ref Range Status   Specimen Description BLOOD RIGHT HAND  Final   Special Requests BOTTLES DRAWN AEROBIC AND ANAEROBIC 10CC  Final   Culture NO GROWTH 5 DAYS  Final   Report Status 12/03/2014 FINAL  Final     Studies:  Recent x-ray studies have been reviewed in detail by the Attending Physician  Scheduled Meds:  Scheduled Meds: . amLODipine  2.5 mg Oral Daily  . enoxaparin (LOVENOX) injection  30 mg Subcutaneous Q24H  . feeding supplement (NEPRO CARB  STEADY)  237 mL Oral TID WC  . folic acid  1 mg Oral Daily  . metoprolol tartrate  12.5 mg Oral BID  . multivitamin with minerals  1 tablet Oral Daily  . nicotine  21 mg Transdermal Daily  . sodium bicarbonate  650 mg Oral TID  . sodium chloride  3 mL Intravenous Q12H  . thiamine  100 mg Oral Daily    Time spent on care of this patient: 25 mins  Velvet Bathe, MD [facility] For Consults/Admissions - Flow Manager [phone] Office  [phone]  Contact MD directly via text page:      [email or web address]      password [facility]  12/06/2014, 5:23 PM   LOS: 9 days

## 2014-12-06 NOTE — Progress Notes (Signed)
Echocardiogram 2D Echocardiogram has been performed.  Jennette Dubin 12/06/2014, 10:32 AM

## 2014-12-07 LAB — BASIC METABOLIC PANEL
Anion gap: 11 (ref 5–15)
BUN: 14 mg/dL (ref 6–20)
CHLORIDE: 103 mmol/L (ref 101–111)
CO2: 29 mmol/L (ref 22–32)
CREATININE: 2.02 mg/dL — AB (ref 0.61–1.24)
Calcium: 8.2 mg/dL — ABNORMAL LOW (ref 8.9–10.3)
GFR calc Af Amer: 36 mL/min — ABNORMAL LOW (ref >60)
GFR calc non Af Amer: 31 mL/min — ABNORMAL LOW (ref >60)
Glucose, Bld: 107 mg/dL — ABNORMAL HIGH (ref 65–99)
POTASSIUM: 3.5 mmol/L (ref 3.5–5.1)
SODIUM: 143 mmol/L (ref 135–145)

## 2014-12-07 NOTE — Care Management Important Message (Signed)
Important Message  Patient Details  Name: Leon Foster MRN: [id number] Date of Birth: 27-May-1940   Medicare Important Message Given:  Yes-fourth notification given    Erenest Rasher, RN 12/07/2014, 3:24 PM

## 2014-12-07 NOTE — Consult Note (Signed)
[facility]  Subjective  - left foot hurts   Objective 127/66 96 97.8 F (36.6 C) (Oral) 18 100%  Intake/Output Summary (Last 24 hours) at 12/07/14 1356 Last data filed at 12/07/14 1302  Gross per 24 hour  Intake 1710.83 ml  Output    430 ml  Net 1280.83 ml   Dry gangrene toes 1-3 and 5 left foot stable in appearance  Assessment/Planning: Dry gangrene left foot overall stable Will consider agram when creatinine less than 1.5 reluctant to give contrast while renal function recovering Will continue to follow  Leon Foster, Leon Foster 12/07/2014 1:56 PM --  Laboratory Lab Results:  Recent Labs  12/06/14 0542  WBC 10.8*  HGB 9.1*  HCT 27.0*  PLT 368   BMET  Recent Labs  12/06/14 0542 12/07/14 0814  NA 140 143  K 3.1* 3.5  CL 103 103  CO2 27 29  GLUCOSE 92 107*  BUN 19 14  CREATININE 2.36* 2.02*  CALCIUM 7.8* 8.2*    COAG Lab Results  Component Value Date   INR 1.10 11/27/2014   No results found for: PTT

## 2014-12-07 NOTE — Progress Notes (Signed)
[place] TEAM 2  Progress Note  Leon Foster OZH:[id number] DOB: [birth year] DOA: 11/27/2014 PCP: Philis Fendt, MD  Admit HPI / Brief Narrative: 74 y.o. M who lives alone w/ a Hx of heavy alcohol (2 bottles of wine/day) and tobacco abuse (1ppd since age 77) who was evaluated by Vascular Surgery/Dr. Ruta Hinds on 11/26/14.  During his w/u labs showed creatinine 5.27 and family was advised to bring patient to [facility] ED. According to family patient had been complaining of left foot pain for approximately a year which progressively got worse over the preceding 2 weeks. One of his sisters looked at his foot and noticed black discoloration of the second left toe. He was taken to urgent care center and then evaluated by vascular surgery who planned for an aortogram and possible intervention on 11/30/14.   In the ED, repeat labs noted creatinine of 7.20 (normal creatinine of 1.06 on 08/20/14), lactate 2.06, normal ammonia and INR, hemoglobin 11.6 WBC 12.6, chest x-ray with emphysema but no acute findings and left foot x-ray w/ no evidence of osteomyelitis. Patient was empirically started on IV vancomycin and Zosyn for presumed cellulitis of left toe/foot.   HPI/Subjective: Pt has no new complaints.  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 Consider when serum creatinine < or around 1.5 - Serum creatinine continues to trend down  Acute renal failure (NOT CKD) -Plan will be to continue fluids at 50 mL/h  Dehydration - currently on fluids, resolving  Hypokalemia - We'll administer Kdur and reassess next am. - reassess next am.  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 versus simple  withdrawal symptoms - follow clinically - G29 and folic acid normal - RPR nonreactive  Code Status: FULL Family Communication: No family present at time of exam today Disposition Plan: If remains stable overnight we'll consider transfer to renal floor 8/18 - continue to follow renal function with eventual plan for vascular surgery  Consultants: Nephrology Vascular surgery  Procedure/Significant Events: 8/12 renal ultrasound Small echogenic foci w/i left kidney small nonobstructing stones  Antibiotics: Zosyn 8/12  Vancomycin 8/12   DVT prophylaxis: lovenox  Objective: Blood pressure 125/76, pulse 87, temperature 98.2 F (36.8 C), temperature source Oral, resp. rate 18, height 5\' 7"  (1.702 m), weight 58.514 kg (129 lb), SpO2 100 %.  Intake/Output Summary (Last 24 hours) at 12/07/14 1605 Last data filed at 12/07/14 1442  Gross per 24 hour  Intake 2110.83 ml  Output    530 ml  Net 1580.83 ml   Exam: General: Alert and awake, in NAD Lungs: Clear to auscultation bilaterally without wheezes or crackles Cardiovascular: Regular rate and rhythm without murmur gallop or rub Abdomen: Nontender, nondistended, soft, bowel sounds positive, no rebound, no ascites, no appreciable mass Extremities: No significant clubbing, or edema bilateral lower extremities - L 2nd toe black and appears non-vital - L great toe darker than other toes - no purulent d/c - no erythema - no clinical evidence of cellulitis   Data Reviewed: Basic Metabolic Panel:  Recent Labs Lab 12/01/14 0459 12/02/14 0806 12/03/14 0349 12/06/14 0542 12/07/14 0814  NA 142 140 148* 140 143  K 4.0 3.5 3.4* 3.1* 3.5  CL 112* 111 116* 103 103  CO2 19* 20* 23 27 29  GLUCOSE 83 90 89 92 107*  BUN 22* 22* 19 19 14   CREATININE 6.45* 5.07* 4.58* 2.36* 2.02*  CALCIUM 8.4* 8.0* 8.5* 7.8* 8.2*   Liver Function Tests:  Recent Labs Lab 12/01/14 0459 12/03/14 0349  AST 18 14*  ALT 8* 8*  ALKPHOS 65 56  BILITOT 0.3 0.3    PROT 6.3* 6.1*  ALBUMIN 1.9* 2.1*   No results for input(s): AMMONIA in the last 168 hours. CBC:  Recent Labs Lab 12/01/14 0459 12/03/14 0349 12/06/14 0542  WBC 12.2* 10.9* 10.8*  HGB 10.3* 9.5* 9.1*  HCT 30.7* 28.2* 27.0*  MCV 93.9 93.1 93.4  PLT 513* 488* 368    Recent Results (from the past 240 hour(s))  MRSA PCR Screening     Status: None   Collection Time: 11/27/14  5:27 PM  Result Value Ref Range Status   MRSA by PCR NEGATIVE NEGATIVE Final    Comment:        The GeneXpert MRSA Assay (FDA approved for NASAL specimens only), is one component of a comprehensive MRSA colonization surveillance program. It is not intended to diagnose MRSA infection nor to guide or monitor treatment for MRSA infections.   Culture, Urine     Status: None   Collection Time: 11/28/14 12:12 PM  Result Value Ref Range Status   Specimen Description URINE, CATHETERIZED  Final   Special Requests NONE  Final   Culture NO GROWTH 1 DAY  Final   Report Status 11/29/2014 FINAL  Final  C difficile quick scan w PCR reflex     Status: None   Collection Time: 11/28/14  2:31 PM  Result Value Ref Range Status   C Diff antigen NEGATIVE NEGATIVE Final   C Diff toxin NEGATIVE NEGATIVE Final   C Diff interpretation Negative for toxigenic C. difficile  Final  Culture, blood (routine x 2)     Status: None   Collection Time: 11/28/14  2:55 PM  Result Value Ref Range Status   Specimen Description BLOOD LEFT HAND  Final   Special Requests BOTTLES DRAWN AEROBIC AND ANAEROBIC 10CC  Final   Culture NO GROWTH 5 DAYS  Final   Report Status 12/03/2014 FINAL  Final  Culture, blood (routine x 2)     Status: None   Collection Time: 11/28/14  3:10 PM  Result Value Ref Range Status   Specimen Description BLOOD RIGHT HAND  Final   Special Requests BOTTLES DRAWN AEROBIC AND ANAEROBIC 10CC  Final   Culture NO GROWTH 5 DAYS  Final   Report Status 12/03/2014 FINAL  Final     Studies:  Recent x-ray studies have  been reviewed in detail by the Attending Physician  Scheduled Meds:  Scheduled Meds: . amLODipine  2.5 mg Oral Daily  . enoxaparin (LOVENOX) injection  30 mg Subcutaneous Q24H  . feeding supplement (NEPRO CARB STEADY)  237 mL Oral TID WC  . folic acid  1 mg Oral Daily  . metoprolol tartrate  12.5 mg Oral BID  . multivitamin with minerals  1 tablet Oral Daily  . nicotine  21 mg Transdermal Daily  . sodium bicarbonate  650 mg Oral TID  . sodium chloride  3 mL Intravenous Q12H  . thiamine  100 mg Oral Daily    Time spent on care of this patient: 25 mins  Velvet Bathe, MD [facility] For Consults/Admissions - Flow Manager [phone] Office  [phone]  Contact MD directly via text page:  [email or web address]      password [email or web address]  12/07/2014, 4:05 PM   LOS: 10 days

## 2014-12-07 NOTE — Evaluation (Signed)
Physical Therapy Evaluation Patient Details Name: Leon Foster MRN: [id number] DOB: August 02, 1940 Today's Date: 12/07/2014   History of Present Illness  74 y.o. M who lives alone w/ a Hx of heavy alcohol (2 bottles of wine/day) and tobacco abuse (1ppd since age 29) who was evaluated by Vascular Surgery/Dr. Ruta Hinds on 11/26/14. During his w/u labs showed creatinine 5.27 and family was advised to bring patient to [facility] ED. 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 Pt currently with functional limitations due to the deficits listed below (see PT Problem List).  Pt will benefit from skilled PT to increase their independence and safety with mobility to allow discharge to the venue listed below.       Follow Up Recommendations SNF    Equipment Recommendations  Rolling walker with 5" wheels;3in1 (PT)    Recommendations for Other Services       Precautions / Restrictions Precautions Precautions: Fall Restrictions Weight Bearing Restrictions: No      Mobility  Bed Mobility Overal bed mobility: Needs Assistance Bed Mobility: Supine to Sit     Supine to sit: Min guard     General bed mobility comments: Cues for technique and for initiation; used rails and slow moving  Transfers Overall transfer level: Needs assistance Equipment used: Rolling walker (2 wheeled) Transfers: Sit to/from Stand Sit to Stand: Mod assist         General transfer comment: Light mod assist to power up; cues for hand placement and safety  Ambulation/Gait Ambulation/Gait assistance: Min assist Ambulation Distance (Feet):  (pivotal steps bed to chair) Assistive device: Rolling walker (2 wheeled) Gait Pattern/deviations: Step-to pattern;Shuffle;Decreased stance time - left     General Gait Details: Took two steps and then  decided it was too painful to continue  Stairs            Wheelchair Mobility    Modified Rankin (Stroke Patients Only)       Balance Overall balance assessment: Needs assistance         Standing balance support: Bilateral upper extremity supported Standing balance-Leahy Scale: Poor Standing balance comment: Is dependent on UE support on RW to unweigh painful LLE in standing                             Pertinent Vitals/Pain Pain Assessment: Faces Faces Pain Scale: Hurts whole lot Pain Location: L foot with any Weight Bearing; Pain limited his ability to walk Pain Descriptors / Indicators: Aching;Discomfort;Grimacing;Guarding Pain Intervention(s): Limited activity within patient's tolerance;Monitored during session;Repositioned    Home Living Family/patient expects to be discharged to:: Private residence Living Arrangements: Alone Available Help at Discharge: Family;Friend(s);Available PRN/intermittently Type of Home: House Home Access: Stairs to enter   Entrance Stairs-Number of Steps: 2 Home Layout: Two level;Able to live on main level with bedroom/bathroom Home Equipment: Other (comment) (to be determined)      Prior Function Level of Independence: Independent               Hand Dominance        Extremity/Trunk Assessment   Upper Extremity Assessment: Generalized weakness           Lower Extremity Assessment: LLE deficits/detail   LLE Deficits / Details: Black toes with dry gangrene, second and first rays     Communication  Communication: Other (comment) (difficult to understand at times)  Cognition Arousal/Alertness: Awake/alert Behavior During Therapy: WFL for tasks assessed/performed Overall Cognitive Status: Impaired/Different from baseline Area of Impairment: Attention   Current Attention Level: Sustained (very easily distractible)           General Comments: Im not sure how much attention he was paying to  discussion re: dc plans    General Comments      Exercises        Assessment/Plan    PT Assessment Patient needs continued PT services  PT Diagnosis Difficulty walking;Acute pain   PT Problem List Decreased strength;Decreased activity tolerance;Decreased balance;Decreased mobility;Decreased coordination;Decreased cognition;Decreased knowledge of use of DME;Decreased safety awareness;Pain  PT Treatment Interventions DME instruction;Gait training;Stair training;Functional mobility training;Therapeutic activities;Therapeutic exercise;Balance training;Patient/family education   PT Goals (Current goals can be found in the Care Plan section) Acute Rehab PT Goals Patient Stated Goal: did not state PT Goal Formulation: Patient unable to participate in goal setting Time For Goal Achievement: 12/21/14 Potential to Achieve Goals: Good    Frequency Min 3X/week   Barriers to discharge Decreased caregiver support Must be able to manage independently in order to dc home    Co-evaluation               End of Session Equipment Utilized During Treatment: Gait belt Activity Tolerance: Patient limited by pain Patient left: in chair;with call bell/phone within reach;with chair alarm set Nurse Communication: Mobility status         Time: 9983-3825 PT Time Calculation (min) (ACUTE ONLY): 15 min   Charges:   PT Evaluation $Initial PT Evaluation Tier I: 1 Procedure     PT G CodesQuin Hoop 12/07/2014, 4:33 PM  Roney Marion, [place] Pager [phone] Office [phone]

## 2014-12-08 LAB — BASIC METABOLIC PANEL
Anion gap: 10 (ref 5–15)
BUN: 11 mg/dL (ref 6–20)
CHLORIDE: 101 mmol/L (ref 101–111)
CO2: 30 mmol/L (ref 22–32)
CREATININE: 1.83 mg/dL — AB (ref 0.61–1.24)
Calcium: 8.4 mg/dL — ABNORMAL LOW (ref 8.9–10.3)
GFR calc Af Amer: 41 mL/min — ABNORMAL LOW (ref >60)
GFR calc non Af Amer: 35 mL/min — ABNORMAL LOW (ref >60)
Glucose, Bld: 102 mg/dL — ABNORMAL HIGH (ref 65–99)
POTASSIUM: 3.5 mmol/L (ref 3.5–5.1)
SODIUM: 141 mmol/L (ref 135–145)

## 2014-12-08 NOTE — Progress Notes (Signed)
Utilization review completed.  

## 2014-12-08 NOTE — Progress Notes (Signed)
TEAM 2  Progress Note  Leon Foster QIW:[id number] DOB: 07-Jan-1941 DOA: 11/27/2014 PCP: Philis Fendt, MD    Admit HPI / Brief Narrative: 74 y.o. M who lives alone w/ a Hx of heavy alcohol (2 bottles of wine/day) and tobacco abuse (1ppd since age 56) who was evaluated by Vascular Surgery/Dr. Ruta Hinds on 11/26/14.  During his w/u labs showed creatinine 5.27 and family was advised to bring patient to [facility] ED. According to family patient had been complaining of left foot pain for approximately a year which progressively got worse over the preceding 2 weeks. One of his sisters looked at his foot and noticed black discoloration of the second left toe. He was taken to urgent care center and then evaluated by vascular surgery who planned for an aortogram and possible intervention on 11/30/14.   In the ED, repeat labs noted creatinine of 7.20 (normal creatinine of 1.06 on 08/20/14), lactate 2.06, normal ammonia and INR, hemoglobin 11.6 WBC 12.6, chest x-ray with emphysema but no acute findings and left foot x-ray w/ no evidence of osteomyelitis. Creatinine continues to trend down with gentle fluid hydration and currently vascular surgery is awaiting improvement in serum creatinine down to 1.5 prior to obtaining arteriogram.  HPI/Subjective: Pt has no new complaints.  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 Consider when serum creatinine < or around 1.5 - Serum creatinine continues to trend down and on last check 1.8.  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 if serum creatinine around 1.5 will notify vascular surgeon for reevaluation for arteriogram  Consultants: Nephrology Vascular surgery  Procedure/Significant Events: 8/12 renal ultrasound Small echogenic foci w/i left kidney small nonobstructing stones  Antibiotics: Zosyn 8/12  Vancomycin 8/12   DVT prophylaxis: lovenox  Objective: Blood pressure 133/81, pulse 95, temperature 98.3 F (36.8 C), temperature source Oral, resp. rate 18, height 5\' 7"  (1.702 m), weight 60.2 kg (132 lb 11.5 oz), SpO2 100 %.  Intake/Output Summary (Last 24 hours) at 12/08/14 1434 Last data filed at 12/08/14 0900  Gross per 24 hour  Intake   1280 ml  Output   1202 ml  Net     78 ml   Exam: General: Alert and awake, in NAD Lungs: Clear to auscultation bilaterally without wheezes or crackles Cardiovascular: Regular rate and rhythm without murmur gallop or rub Abdomen: Nontender, nondistended, soft, bowel sounds positive, no rebound, no ascites, no appreciable mass Extremities: No significant clubbing, or edema bilateral lower extremities - L 2nd toe black and appears non-vital - L great toe darker than other toes - no purulent d/c - no erythema - no clinical evidence of cellulitis   Data Reviewed: Basic Metabolic Panel:  Recent Labs Lab 12/02/14 0806 12/03/14 0349 12/06/14 0542 12/07/14 0814 12/08/14 0958  NA 140 148* 140 143 141  K 3.5 3.4* 3.1* 3.5 3.5  CL  111 116* 103 103 101  CO2 20* 23 27 29 30   GLUCOSE 90 89 92 107* 102*  BUN 22* 19 19 14 11   CREATININE 5.07* 4.58* 2.36* 2.02* 1.83*  CALCIUM 8.0* 8.5* 7.8* 8.2* 8.4*   Liver Function Tests:  Recent Labs Lab 12/03/14 0349  AST 14*  ALT  8*  ALKPHOS 56  BILITOT 0.3  PROT 6.1*  ALBUMIN 2.1*   No results for input(s): AMMONIA in the last 168 hours. CBC:  Recent Labs Lab 12/03/14 0349 12/06/14 0542  WBC 10.9* 10.8*  HGB 9.5* 9.1*  HCT 28.2* 27.0*  MCV 93.1 93.4  PLT 488* 368    Recent Results (from the past 240 hour(s))  Culture, blood (routine x 2)     Status: None   Collection Time: 11/28/14  2:55 PM  Result Value Ref Range Status   Specimen Description BLOOD LEFT HAND  Final   Special Requests BOTTLES DRAWN AEROBIC AND ANAEROBIC 10CC  Final   Culture NO GROWTH 5 DAYS  Final   Report Status 12/03/2014 FINAL  Final  Culture, blood (routine x 2)     Status: None   Collection Time: 11/28/14  3:10 PM  Result Value Ref Range Status   Specimen Description BLOOD RIGHT HAND  Final   Special Requests BOTTLES DRAWN AEROBIC AND ANAEROBIC 10CC  Final   Culture NO GROWTH 5 DAYS  Final   Report Status 12/03/2014 FINAL  Final     Studies:  Recent x-ray studies have been reviewed in detail by the Attending Physician  Scheduled Meds:  Scheduled Meds: . amLODipine  2.5 mg Oral Daily  . enoxaparin (LOVENOX) injection  30 mg Subcutaneous Q24H  . feeding supplement (NEPRO CARB STEADY)  237 mL Oral TID WC  . folic acid  1 mg Oral Daily  . metoprolol tartrate  12.5 mg Oral BID  . multivitamin with minerals  1 tablet Oral Daily  . nicotine  21 mg Transdermal Daily  . sodium bicarbonate  650 mg Oral TID  . sodium chloride  3 mL Intravenous Q12H  . thiamine  100 mg Oral Daily    Time spent on care of this patient: 25 mins  Velvet Bathe, MD [facility] For Consults/Admissions - Flow Manager [phone] Office  [phone]  Contact MD directly via text page:      [email or web address]      password [facility]  12/08/2014, 2:34 PM   LOS: 11 days

## 2014-12-09 DIAGNOSIS — E876 Hypokalemia: Secondary | ICD-10-CM

## 2014-12-09 DIAGNOSIS — I96 Gangrene, not elsewhere classified: Secondary | ICD-10-CM

## 2014-12-09 DIAGNOSIS — N179 Acute kidney failure, unspecified: Secondary | ICD-10-CM

## 2014-12-09 LAB — BASIC METABOLIC PANEL
Anion gap: 10 (ref 5–15)
BUN: 14 mg/dL (ref 6–20)
CHLORIDE: 100 mmol/L — AB (ref 101–111)
CO2: 31 mmol/L (ref 22–32)
Calcium: 8.3 mg/dL — ABNORMAL LOW (ref 8.9–10.3)
Creatinine, Ser: 2.03 mg/dL — ABNORMAL HIGH (ref 0.61–1.24)
GFR calc non Af Amer: 31 mL/min — ABNORMAL LOW (ref >60)
GFR, EST AFRICAN AMERICAN: 36 mL/min — AB (ref >60)
Glucose, Bld: 109 mg/dL — ABNORMAL HIGH (ref 65–99)
POTASSIUM: 3.2 mmol/L — AB (ref 3.5–5.1)
SODIUM: 141 mmol/L (ref 135–145)

## 2014-12-09 MED ORDER — POTASSIUM CHLORIDE CRYS ER 20 MEQ PO TBCR
20.0000 meq | EXTENDED_RELEASE_TABLET | Freq: Once | ORAL | Status: AC
  Administered 2014-12-09: 20 meq via ORAL
  Filled 2014-12-09: qty 1

## 2014-12-09 NOTE — Progress Notes (Signed)
[name] TEAM 2  Progress Note  Leon Foster KWI:[id number] DOB: 1940/08/04 DOA: 11/27/2014 PCP: Philis Fendt, MD    Brief Narrative: 74 y.o. M who lives alone w/ a Hx of heavy alcohol (2 bottles of wine/day) and tobacco abuse (1ppd since age 60) who was evaluated by Vascular Surgery/Dr. Ruta Hinds on 11/26/14.  During his w/u labs showed creatinine 5.27 and family was advised to bring patient to [facility] ED. According to family patient had been complaining of left foot pain for approximately a year which progressively got worse over the preceding 2 weeks. One of his sisters looked at his foot and noticed black discoloration of the second left toe. He was taken to urgent care center and then evaluated by vascular surgery who planned for an aortogram and possible intervention on 11/30/14.   Creatinine continues to trend down with gentle fluid hydration and currently vascular surgery is awaiting improvement in serum creatinine down to 1.5 prior to obtaining arteriogram.  Subjective: Patient feels well. Denies any complaints. Denies significant pain in his left foot.   Assessment/Plan:  Dry gangrene left toes - Vasc Surgery has been following. Arteriogram on hold until renal function improved/stabilized. Consider when serum creatinine < or around 1.5. Per vascular surgery notes the foot is at high risk for developing infection resulting in overwhelming sepsis. Was initially given vancomycin and Zosyn. Currently off of antibiotics.  Acute renal failure (NOT CKD) -Nephrology had been following. They have signed off. Creatinine was improving up until today with when it appears to have bumped up. We will increase the rate of IV fluids. Repeat creatinine tomorrow morning.   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 Currently stable. M42 and folic acid normal. RPR nonreactive.  DVT prophylaxis: lovenox Code Status: FULL Family Communication: Discussed with patient Disposition Plan: Await improvement in renal function.   Consultants: Nephrology Vascular surgery  Procedure/Significant Events: 8/12 renal ultrasound Small echogenic foci w/i left kidney small nonobstructing stones  Antibiotics: Zosyn given on 8/12  Vancomycin given on 8/12    Objective: Blood pressure 123/65, pulse 100, temperature 98.4 F (36.9 C), temperature source Oral, resp. rate 18, height 5\' 7"  (1.702 m), weight 57.924 kg (127 lb 11.2 oz), SpO2 100 %.  Intake/Output Summary (Last 24 hours) at 12/09/14 1003 Last data filed at 12/09/14 0900  Gross per 24 hour  Intake    720 ml  Output    800 ml  Net    -80 ml   Exam: General: Alert and awake, in NAD Lungs: Clear to auscultation bilaterally without wheezes or crackles Cardiovascular: Regular rate and rhythm without murmur gallop or rub Abdomen: Nontender, nondistended, soft, bowel sounds positive, no rebound, no ascites, no appreciable mass Extremities: L 2nd toe black and appears non-vital - L great toe darker than other toes - no purulent d/c - no erythema - no clinical evidence of cellulitis   Data Reviewed: Basic Metabolic Panel:  Recent Labs Lab 12/03/14 0349 12/06/14 0542 12/07/14 0814 12/08/14 0958 12/09/14 0011  NA 148* 140 143 141 141  K 3.4* 3.1* 3.5 3.5 3.2*  CL 116* 103 103 101 100*  CO2 23 27 29 30 31   GLUCOSE 89 92 107* 102* 109*  BUN 19 19 14 11  14  CREATININE 4.58* 2.36* 2.02* 1.83* 2.03*  CALCIUM 8.5* 7.8* 8.2* 8.4* 8.3*   Liver Function Tests:  Recent Labs Lab 12/03/14 0349  AST 14*  ALT 8*  ALKPHOS 56  BILITOT 0.3  PROT 6.1*  ALBUMIN 2.1*   CBC:  Recent Labs Lab 12/03/14 0349 12/06/14 0542  WBC 10.9* 10.8*  HGB  9.5* 9.1*  HCT 28.2* 27.0*  MCV 93.1 93.4  PLT 488* 368     Scheduled Meds:  Scheduled Meds: . amLODipine  2.5 mg Oral Daily  . enoxaparin (LOVENOX) injection  30 mg Subcutaneous Q24H  . feeding supplement (NEPRO CARB STEADY)  237 mL Oral TID WC  . folic acid  1 mg Oral Daily  . metoprolol tartrate  12.5 mg Oral BID  . multivitamin with minerals  1 tablet Oral Daily  . nicotine  21 mg Transdermal Daily  . potassium chloride  20 mEq Oral Once  . sodium bicarbonate  650 mg Oral TID  . sodium chloride  3 mL Intravenous Q12H  . thiamine  100 mg Oral Daily    Time spent on care of this patient: 25 mins  [place] - [phone] Office  [phone]  Contact MD directly via text page:      [email or web address]      password [facility]  12/09/2014, 10:03 AM   LOS: 12 days

## 2014-12-10 DIAGNOSIS — F1010 Alcohol abuse, uncomplicated: Secondary | ICD-10-CM

## 2014-12-10 LAB — BASIC METABOLIC PANEL
Anion gap: 9 (ref 5–15)
BUN: 10 mg/dL (ref 6–20)
CALCIUM: 8.5 mg/dL — AB (ref 8.9–10.3)
CO2: 30 mmol/L (ref 22–32)
CREATININE: 1.49 mg/dL — AB (ref 0.61–1.24)
Chloride: 102 mmol/L (ref 101–111)
GFR calc Af Amer: 52 mL/min — ABNORMAL LOW (ref >60)
GFR, EST NON AFRICAN AMERICAN: 45 mL/min — AB (ref >60)
Glucose, Bld: 97 mg/dL (ref 65–99)
POTASSIUM: 3.2 mmol/L — AB (ref 3.5–5.1)
SODIUM: 141 mmol/L (ref 135–145)

## 2014-12-10 MED ORDER — ENOXAPARIN SODIUM 40 MG/0.4ML ~~LOC~~ SOLN
40.0000 mg | SUBCUTANEOUS | Status: DC
  Administered 2014-12-10 – 2014-12-13 (×4): 40 mg via SUBCUTANEOUS
  Filled 2014-12-10 (×5): qty 0.4

## 2014-12-10 MED ORDER — POTASSIUM CHLORIDE CRYS ER 20 MEQ PO TBCR
40.0000 meq | EXTENDED_RELEASE_TABLET | Freq: Once | ORAL | Status: AC
  Administered 2014-12-10: 40 meq via ORAL
  Filled 2014-12-10: qty 2

## 2014-12-10 NOTE — Progress Notes (Signed)
Physical Therapy Treatment Patient Details Name: Leon Foster MRN: [id number] DOB: 02/27/1941 Today's Date: Dec 30, 2014    History of Present Illness 74 y.o. M who lives alone w/ a Hx of heavy alcohol (2 bottles of wine/day) and tobacco abuse (1ppd since age 59) who was evaluated by Vascular Surgery/Dr. Ruta Hinds on 11/26/14. During his w/u labs showed creatinine 5.27 and family was advised to bring patient to [facility] ED. Dry gangrene left second metatarsal w/ sepsis unspecified organism Vasc Surgery is following, w/ arteriogram on hold until renal function improved/stabilized    PT Comments    Patient making slow gains with mobility - limited by pain.  Follow Up Recommendations  SNF     Equipment Recommendations  Rolling walker with 5" wheels;3in1 (PT)    Recommendations for Other Services       Precautions / Restrictions Precautions Precautions: Fall Restrictions Weight Bearing Restrictions: No    Mobility  Bed Mobility Overal bed mobility: Needs Assistance Bed Mobility: Supine to Sit     Supine to sit: Supervision        Transfers Overall transfer level: Needs assistance Equipment used: Rolling walker (2 wheeled) Transfers: Sit to/from Stand Sit to Stand: Min assist         General transfer comment: Min assist to steady during transition.  Patient able to take steps in place in stance.    Ambulation/Gait             General Gait Details: Declined due to pain in foot.   Stairs            Wheelchair Mobility    Modified Rankin (Stroke Patients Only)       Balance                                    Cognition Arousal/Alertness: Awake/alert Behavior During Therapy: WFL for tasks assessed/performed Overall Cognitive Status: Impaired/Different from baseline Area of Impairment: Attention   Current Attention Level: Sustained                Exercises      General Comments        Pertinent Vitals/Pain Pain  Assessment: Faces Faces Pain Scale: Hurts little more Pain Location: Lt foot Pain Descriptors / Indicators: Sore (with weight bearing) Pain Intervention(s): Limited activity within patient's tolerance;Repositioned    Home Living                      Prior Function            PT Goals (current goals can now be found in the care plan section) Progress towards PT goals: Progressing toward goals    Frequency  Min 3X/week    PT Plan Current plan remains appropriate    Co-evaluation             End of Session Equipment Utilized During Treatment: Gait belt Activity Tolerance: Patient limited by pain Patient left: in bed;with call bell/phone within reach;with bed alarm set (sitting EOB for dinner)     Time: 1731-1740 PT Time Calculation (min) (ACUTE ONLY): 9 min  Charges:  $Therapeutic Activity: 8-22 mins                    G Codes:      Despina Pole 12-30-2014, 6:53 PM Carita Pian. Sanjuana Kava, [place] Pager [phone]

## 2014-12-10 NOTE — Progress Notes (Signed)
Progress Note  Leon Foster QHU:[id number] DOB: Mar 01, 1941 DOA: 11/27/2014 PCP: Philis Fendt, MD    Brief Narrative: 74 y.o. M who lives alone w/ a Hx of heavy alcohol (2 bottles of wine/day) and tobacco abuse (1ppd since age 64) who was evaluated by Vascular Surgery/Dr. Ruta Hinds on 11/26/14.  During his w/u labs showed creatinine 5.27 and family was advised to bring patient to [facility] ED. According to family patient had been complaining of left foot pain for approximately a year which progressively got worse over the preceding 2 weeks. One of his sisters looked at his foot and noticed black discoloration of the second left toe. He was taken to urgent care center and then evaluated by vascular surgery who planned for an aortogram and possible intervention. Patient noted to be in renal failure. Vascular surgery waiting for creatinine to improve for angiogram.  Subjective: Patient continues to feel well. He denies any complaints. No pain at this time.   Assessment/Plan:  Dry gangrene left toes - Vasc Surgery has been following. Arteriogram on hold until renal function improved/stabilized. Consider when serum creatinine < or around 1.5. Per vascular surgery notes the foot is at high risk for developing infection resulting in overwhelming sepsis. Was initially given vancomycin and Zosyn. Currently off of antibiotics. Creatinine is less than 1.5 today. Vascular surgery notified.  Acute renal failure (NOT CKD) -Nephrology had been following. They have signed off. Creatinine continues to improve. Continue higher rate of IV fluids.   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 Currently stable. W65 and folic acid  normal. RPR nonreactive.  Normocytic anemia Hemoglobin has been stable.  DVT prophylaxis: lovenox Code Status: FULL Family Communication: Discussed with patient Disposition Plan: Await angiogram.  Consultants: Nephrology Vascular surgery  Procedure/Significant Events: 8/12 renal ultrasound Small echogenic foci w/i left kidney small nonobstructing stones  Antibiotics: Zosyn given on 8/12  Vancomycin given on 8/12    Objective: Blood pressure 137/81, pulse 91, temperature 98.1 F (36.7 C), temperature source Oral, resp. rate 17, height 5\' 7"  (1.702 m), weight 58.695 kg (129 lb 6.4 oz), SpO2 99 %.  Intake/Output Summary (Last 24 hours) at 12/10/14 0903 Last data filed at 12/10/14 0855  Gross per 24 hour  Intake    960 ml  Output   1351 ml  Net   -391 ml   Exam: General: Alert and awake, in NAD Lungs: Clear to auscultation bilaterally Cardiovascular: Regular rate and rhythm without murmur gallop or rub Abdomen: Nontender, nondistended, soft, bowel sounds positive, no rebound, no ascites, no appreciable mass Extremities: L 2nd toe black and appears non-vital - L great toe darker than other toes - no purulent d/c - no erythema - no clinical evidence of cellulitis   Data Reviewed: Basic Metabolic Panel:  Recent Labs Lab 12/06/14 0542 12/07/14 0814 12/08/14 0958 12/09/14 0011 12/10/14 0725  NA 140 143 141 141 141  K 3.1* 3.5 3.5 3.2* 3.2*  CL 103 103 101 100* 102  CO2 27 29 30 31 30   GLUCOSE 92 107* 102* 109* 97  BUN 19 14 11 14 10   CREATININE 2.36* 2.02* 1.83* 2.03* 1.49*  CALCIUM 7.8* 8.2* 8.4* 8.3* 8.5*   CBC:  Recent Labs Lab 12/06/14 0542  WBC 10.8*  HGB 9.1*  HCT 27.0*  MCV 93.4  PLT 368     Scheduled Meds:  Scheduled Meds: . amLODipine  2.5 mg Oral Daily  . enoxaparin (LOVENOX) injection  30 mg Subcutaneous Q24H  . feeding supplement (NEPRO CARB STEADY)  237 mL Oral TID WC  . folic acid  1 mg Oral Daily  . metoprolol tartrate  12.5 mg Oral  BID  . multivitamin with minerals  1 tablet Oral Daily  . nicotine  21 mg Transdermal Daily  . potassium chloride  40 mEq Oral Once  . sodium bicarbonate  650 mg Oral TID  . sodium chloride  3 mL Intravenous Q12H  . thiamine  100 mg Oral Daily    Time spent on care of this patient: 25 mins  [place] - [phone] Office  [phone]  Contact MD directly via text page:      [email or web address]      password [facility]  12/10/2014, 9:03 AM   LOS: 13 days

## 2014-12-10 NOTE — Care Management Important Message (Signed)
Important Message  Patient Details  Name: Leon Foster MRN: [id number] Date of Birth: 1941-04-01   Medicare Important Message Given:  Yes-fourth notification given    Delorse Lek 12/10/2014, 12:04 PM

## 2014-12-10 NOTE — Progress Notes (Addendum)
  [facility] Progress Note  Subjective  - Left foot pain unchanged  Objective Filed Vitals:   12/10/14 0932  BP: 137/68  Pulse: 97  Temp: 98.1 F (36.7 C)  Resp: 16    Intake/Output Summary (Last 24 hours) at 12/10/14 1400 Last data filed at 12/10/14 1243  Gross per 24 hour  Intake    780 ml  Output   1576 ml  Net   -796 ml    Dry gangrenous changes to left 1st-3rd toe and 5th toes. No cellulitis, no purulence, no drainage.   Assessment/Planning: 74 y.o. male with dry gangrene of left toes  -Renal function recovering with creatinine 1.49 today. Was 2.03 yesterday. Recheck creatinine tomorrow.  -No clinical evidence of infection of left foot.  -Plan for arteriogram with possible intervention once renal function is more stable with creatinine less than 1.5. No urgent indication for intervention unless foot becomes infected.   Leon Foster 12/10/2014 2:00 PM --  Laboratory CBC    Component Value Date/Time   WBC 10.8* 12/06/2014 0542   WBC 11.7* 11/26/2014 1311   HGB 9.1* 12/06/2014 0542   HGB 10.9* 11/26/2014 1311   HCT 27.0* 12/06/2014 0542   HCT 34.7* 11/26/2014 1311   PLT 368 12/06/2014 0542    BMET    Component Value Date/Time   NA 141 12/10/2014 0725   K 3.2* 12/10/2014 0725   CL 102 12/10/2014 0725   CO2 30 12/10/2014 0725   GLUCOSE 97 12/10/2014 0725   BUN 10 12/10/2014 0725   CREATININE 1.49* 12/10/2014 0725   CREATININE 5.27* 11/26/2014 1308   CALCIUM 8.5* 12/10/2014 0725   GFRNONAA 45* 12/10/2014 0725   GFRNONAA 10* 11/26/2014 1308   GFRAA 52* 12/10/2014 0725   GFRAA 12* 11/26/2014 1308    COAG Lab Results  Component Value Date   INR 1.10 11/27/2014   No results found for: PTT  Antibiotics Anti-infectives    Start     Dose/Rate Route Frequency Ordered Stop   11/27/14 1800  piperacillin-tazobactam (ZOSYN) IVPB 2.25 g  Status:  Discontinued     2.25 g 100 mL/hr over 30 Minutes Intravenous 3 times per day  11/27/14 1055 11/27/14 1729   11/27/14 1100  vancomycin (VANCOCIN) IVPB 1000 mg/200 mL premix     1,000 mg 200 mL/hr over 60 Minutes Intravenous  Once 11/27/14 1044 11/27/14 1311   11/27/14 1045  piperacillin-tazobactam (ZOSYN) IVPB 3.375 g     3.375 g 100 mL/hr over 30 Minutes Intravenous  Once 11/27/14 1044 11/27/14 1225       Leon Jock, PA-C [facility] Office: [phone] Pager: [phone] 12/10/2014 2:00 PM    I agree with the above.  The patient's creatinine has improved.  We will consider intervention, possibly on Monday. Annamarie Major

## 2014-12-11 LAB — CBC
HEMATOCRIT: 28.2 % — AB (ref 39.0–52.0)
Hemoglobin: 9.2 g/dL — ABNORMAL LOW (ref 13.0–17.0)
MCH: 30.1 pg (ref 26.0–34.0)
MCHC: 32.6 g/dL (ref 30.0–36.0)
MCV: 92.2 fL (ref 78.0–100.0)
Platelets: 312 10*3/uL (ref 150–400)
RBC: 3.06 MIL/uL — AB (ref 4.22–5.81)
RDW: 18.2 % — ABNORMAL HIGH (ref 11.5–15.5)
WBC: 8.7 10*3/uL (ref 4.0–10.5)

## 2014-12-11 LAB — BASIC METABOLIC PANEL
Anion gap: 9 (ref 5–15)
BUN: 10 mg/dL (ref 6–20)
CHLORIDE: 102 mmol/L (ref 101–111)
CO2: 29 mmol/L (ref 22–32)
Calcium: 8.5 mg/dL — ABNORMAL LOW (ref 8.9–10.3)
Creatinine, Ser: 1.35 mg/dL — ABNORMAL HIGH (ref 0.61–1.24)
GFR calc non Af Amer: 51 mL/min — ABNORMAL LOW (ref >60)
GFR, EST AFRICAN AMERICAN: 59 mL/min — AB (ref >60)
Glucose, Bld: 103 mg/dL — ABNORMAL HIGH (ref 65–99)
POTASSIUM: 3.6 mmol/L (ref 3.5–5.1)
SODIUM: 140 mmol/L (ref 135–145)

## 2014-12-11 NOTE — Clinical Social Work Note (Signed)
Clinical Social Work Assessment  Patient Details  Name: Leon Foster MRN: [id number] Date of Birth: [birth year]  Date of referral:  12/10/14               Reason for consult:  Facility Placement                Permission sought to share information with:  Family Supports Permission granted to share information::  Yes, Verbal Permission Granted  Name::     Shelbie Ammons  Agency::     Relationship::  Sister  Contact Information:  [phone]  Housing/Transportation Living arrangements for the past 2 months:  Single Family Home Source of Information:  Patient, Other (Comment Required) (Talked with patient and sister ) Patient Interpreter Needed:  None Criminal Activity/Legal Involvement Pertinent to Current Situation/Hospitalization:  No - Comment as needed Significant Relationships:    Lives with:  Siblings Do you feel safe going back to the place where you live?  Yes Need for family participation in patient care:  Yes (Comment)  Care giving concerns:  None reported by patient   Social Worker assessment / plan:  CSW talked with patient regarding dishcarge planning and recommendation of ST rehab. Patient in agreement and gave CSW permission to talk with his sister Shelbie Ammons [phone]).  Employment status:  Retired Forensic scientist:  Medicare PT Recommendations:  [place] / Referral to community resources:  Other (Comment Required) (Not needed or requested at this time)  Patient/Family's Response to care:  No conerns expressed.  Patient/Family's Understanding of and Emotional Response to Diagnosis, Current Treatment, and Prognosis:  Not discussed.  Emotional Assessment Appearance:  Appears stated age Attitude/Demeanor/Rapport:  Other (Appropriate) Affect (typically observed):  Appropriate Orientation:  Oriented to Self, Oriented to Place Alcohol / Substance use:  Tobacco Use, Alcohol Use (No illicit drug use) Psych involvement  (Current and /or in the community):  No (Comment)  Discharge Needs  Concerns to be addressed:  Discharge Planning Concerns Readmission within the last 30 days:  No Current discharge risk:  None Barriers to Discharge:  No Barriers Identified   Sable Feil, LCSW 12/11/2014, 8:47 PM

## 2014-12-11 NOTE — Progress Notes (Addendum)
Progress Note  Leon Foster:[id number] DOB: 05/09/1940 DOA: 11/27/2014 PCP: Philis Fendt, MD    Brief Narrative: 74 y.o. M who lives alone w/ a Hx of heavy alcohol (2 bottles of wine/day) and tobacco abuse (1ppd since age 5) who was evaluated by Vascular Surgery/Dr. Ruta Hinds on 11/26/14.  During his w/u labs showed creatinine 5.27 and family was advised to bring patient to [facility] ED. According to family patient had been complaining of left foot pain for approximately a year which progressively got worse over the preceding 2 weeks. One of his sisters looked at his foot and noticed black discoloration of the second left toe. He was taken to urgent care center and then evaluated by vascular surgery who planned for an aortogram and possible intervention. Patient noted to be in renal failure. Vascular surgery waiting for creatinine to improve for angiogram.  Subjective: Patient continues to feel well.    Assessment/Plan:  Dry gangrene left toes - Vasc Surgery has been following. Arteriogram on hold until renal function improved/stabilized. Patient's creatinine has been less than 1.5 for 2 days. Per vascular surgery notes the foot is at high risk for developing infection resulting in overwhelming sepsis, therefore will not discharge patient. Was initially given vancomycin and Zosyn. Currently off of antibiotics.  Vascular surgery notified, Dr. early. Requested him to make further recommendations. Angiogram tentatively scheduled for Monday  Acute renal failure (NOT CKD) -Nephrology had been following. They have signed off. Creatinine continues to improve. Continue higher rate of IV fluids.   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 Currently stable. Y50 and folic acid normal. RPR nonreactive.  Normocytic anemia Hemoglobin has been stable.  DVT prophylaxis: lovenox Code Status: FULL Family Communication: Discussed with patient Disposition Plan: Await angiogram. Requested vascular surgery to reassess  Consultants: Nephrology Vascular surgery  Procedure/Significant Events: 8/12 renal ultrasound Small echogenic foci w/i left kidney small nonobstructing stones  Antibiotics: Zosyn given on 8/12  Vancomycin given on 8/12    Objective: Blood pressure 122/72, pulse 91, temperature 98.3 F (36.8 C), temperature source Oral, resp. rate 16, height 5\' 7"  (1.702 m), weight 58.7 kg (129 lb 6.6 oz), SpO2 100 %.  Intake/Output Summary (Last 24 hours) at 12/11/14 1326 Last data filed at 12/11/14 1200  Gross per 24 hour  Intake   2790 ml  Output   1350 ml  Net   1440 ml   Exam: General: Alert and awake, in NAD Lungs: Clear to auscultation bilaterally Cardiovascular: Regular rate and rhythm without murmur gallop or rub Abdomen: Nontender, nondistended, soft, bowel sounds positive, no rebound, no ascites, no appreciable mass Extremities: L 2nd toe black and appears non-vital - L great toe darker than other toes - no purulent d/c - no erythema - no clinical evidence of cellulitis   Data Reviewed: Basic Metabolic Panel:  Recent Labs Lab 12/07/14 0814 12/08/14 0958 12/09/14 0011 12/10/14 0725 12/11/14 0542  NA 143 141 141 141 140  K 3.5 3.5 3.2* 3.2* 3.6  CL 103 101 100* 102 102  CO2 29 30 31 30 29   GLUCOSE 107* 102* 109* 97 103*  BUN 14 11 14 10 10   CREATININE 2.02* 1.83* 2.03* 1.49* 1.35*  CALCIUM 8.2* 8.4* 8.3* 8.5* 8.5*   CBC:  Recent Labs Lab 12/06/14 0542 12/11/14 0542  WBC 10.8* 8.7  HGB 9.1* 9.2*  HCT 27.0* 28.2*  MCV 93.4 92.2  PLT 368 312     Scheduled Meds:  Scheduled Meds: . amLODipine  2.5 mg Oral Daily  . enoxaparin (LOVENOX) injection  40 mg  Subcutaneous Q24H  . feeding supplement (NEPRO CARB STEADY)  237 mL Oral TID WC  . folic acid  1 mg Oral Daily  . metoprolol tartrate  12.5 mg Oral BID  . multivitamin with minerals  1 tablet Oral Daily  . nicotine  21 mg Transdermal Daily  . sodium bicarbonate  650 mg Oral TID  . sodium chloride  3 mL Intravenous Q12H  . thiamine  100 mg Oral Daily    Time spent on care of this patient: 25 mins  Reyne Dumas [phone]  [facility] [place] - [phone] Office  [phone]  Contact MD directly via text page:      [email or web address]      password [facility]  12/11/2014, 1:26 PM   LOS: 14 days

## 2014-12-11 NOTE — Care Management Important Message (Signed)
Important Message  Patient Details  Name: Leon Foster MRN: [id number] Date of Birth: [birth year]   Medicare Important Message Given:  Yes-fourth notification given    RoyalRory Percy, RN 12/11/2014, 11:35 AM

## 2014-12-11 NOTE — Progress Notes (Signed)
Physical Therapy Treatment Patient Details Name: Leon Foster MRN: [id number] DOB: 05-31-1940 Today's Date: 12/11/2014    History of Present Illness 74 y.o. M who lives alone w/ a Hx of heavy alcohol (2 bottles of wine/day) and tobacco abuse (1ppd since age 57) who was evaluated by Vascular Surgery/Dr. Ruta Hinds on 11/26/14. During his w/u labs showed creatinine 5.27 and family was advised to bring patient to [facility] ED. Dry gangrene left second metatarsal w/ sepsis unspecified organism Vasc Surgery is following, w/ arteriogram on hold until renal function improved/stabilized    PT Comments    Patient able to ambulate short distance today.  Continued to be limited by pain Lt foot.  Follow Up Recommendations  SNF     Equipment Recommendations  Rolling walker with 5" wheels;3in1 (PT)    Recommendations for Other Services       Precautions / Restrictions Precautions Precautions: Fall Restrictions Weight Bearing Restrictions: No    Mobility  Bed Mobility               General bed mobility comments: Patient in chair as PT entered room  Transfers Overall transfer level: Needs assistance Equipment used: Rolling walker (2 wheeled) Transfers: Sit to/from Stand Sit to Stand: Min assist         General transfer comment: Verbal cues for hand placement.  Assist to steady during transition.  Ambulation/Gait Ambulation/Gait assistance: Min assist Ambulation Distance (Feet): 22 Feet Assistive device: Rolling walker (2 wheeled) Gait Pattern/deviations: Step-to pattern;Decreased stance time - left;Decreased step length - right;Decreased step length - left;Decreased stride length;Decreased weight shift to left;Antalgic Gait velocity: Decreased. Gait velocity interpretation: Below normal speed for age/gender General Gait Details: Verbal cues for safe use of RW.  Patient with antalgic gait, reporting pain Lt foot.  Limited distance due to pain.   Stairs             Wheelchair Mobility    Modified Rankin (Stroke Patients Only)       Balance                                    Cognition Arousal/Alertness: Awake/alert Behavior During Therapy: WFL for tasks assessed/performed Overall Cognitive Status: Impaired/Different from baseline Area of Impairment: Attention   Current Attention Level: Sustained                Exercises      General Comments        Pertinent Vitals/Pain Pain Assessment: Faces Faces Pain Scale: Hurts little more Pain Location: Lt foot Pain Descriptors / Indicators: Sore (with gait) Pain Intervention(s): Limited activity within patient's tolerance;Repositioned    Home Living                      Prior Function            PT Goals (current goals can now be found in the care plan section) Progress towards PT goals: Progressing toward goals    Frequency  Min 3X/week    PT Plan Current plan remains appropriate    Co-evaluation             End of Session Equipment Utilized During Treatment: Gait belt Activity Tolerance: Patient limited by pain Patient left: in chair;with call bell/phone within reach     Time: 9983-3825 PT Time Calculation (min) (ACUTE ONLY): 9 min  Charges:  $Gait Training: 8-22 mins  G Codes:      Despina Pole 12/11/2014, 3:07 PM Carita Pian. Sanjuana Kava, [place] Pager [phone]

## 2014-12-12 LAB — COMPREHENSIVE METABOLIC PANEL
ALK PHOS: 43 U/L (ref 38–126)
ALT: 9 U/L — ABNORMAL LOW (ref 17–63)
ANION GAP: 10 (ref 5–15)
AST: 18 U/L (ref 15–41)
Albumin: 2.3 g/dL — ABNORMAL LOW (ref 3.5–5.0)
BUN: 13 mg/dL (ref 6–20)
CALCIUM: 8.6 mg/dL — AB (ref 8.9–10.3)
CO2: 29 mmol/L (ref 22–32)
Chloride: 99 mmol/L — ABNORMAL LOW (ref 101–111)
Creatinine, Ser: 1.37 mg/dL — ABNORMAL HIGH (ref 0.61–1.24)
GFR calc non Af Amer: 50 mL/min — ABNORMAL LOW (ref >60)
GFR, EST AFRICAN AMERICAN: 58 mL/min — AB (ref >60)
Glucose, Bld: 103 mg/dL — ABNORMAL HIGH (ref 65–99)
POTASSIUM: 3.4 mmol/L — AB (ref 3.5–5.1)
SODIUM: 138 mmol/L (ref 135–145)
Total Bilirubin: 0.4 mg/dL (ref 0.3–1.2)
Total Protein: 7.4 g/dL (ref 6.5–8.1)

## 2014-12-12 NOTE — Progress Notes (Signed)
Patient ID: Leon Foster, male   DOB: 1940/07/14, 74 y.o.   MRN: [id number] Stable overall. Denies pain in his left foot.  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 His calf is nontender. Does have calcification of his femoral artery but the palpable femoral pulse.  Renal function has stabilized. Scheduled for aortogram with left leg runoff on Monday to determine if revascularization is possible.

## 2014-12-12 NOTE — Progress Notes (Signed)
CSW met with patient and sister, Joycelyn Schmid, who states she is HCPOA- asked her to bring copy for chart and for SNF. CSW provided SNF bed offers to them and their preference is [facility]- patient is not medically ready but have messaged [facility] to make them aware of their interest and hope for bed once ready.  No further questions at this time.   Eduard Clos, MSW, LCSWA  [phone]/weekend  coverage

## 2014-12-12 NOTE — Progress Notes (Signed)
Progress Note  Leon Foster JEH:[id number] DOB: 1941/02/20 DOA: 11/27/2014 PCP: Philis Fendt, MD    Brief Narrative: 74 y.o. M who lives alone w/ a Hx of heavy alcohol (2 bottles of wine/day) and tobacco abuse (1ppd since age 18) who was evaluated by Vascular Surgery/Dr. Ruta Hinds on 11/26/14.  During his w/u labs showed creatinine 5.27 and family was advised to bring patient to [facility] ED. According to family patient had been complaining of left foot pain for approximately a year which progressively got worse over the preceding 2 weeks. One of his sisters looked at his foot and noticed black discoloration of the second left toe. He was taken to urgent care center and then evaluated by vascular surgery who planned for an aortogram and possible intervention. Patient noted to be in renal failure. Vascular surgery waiting for creatinine to improve for angiogram.  Subjective: Hemodynamically stable overnight    Assessment/Plan:  Dry gangrene left toes - Vasc Surgery has been following. Arteriogram on hold until renal function improved/stabilized. Patient's creatinine has been less than 1.5 for 2 days. Per vascular surgery notes the foot is at high risk for developing infection resulting in overwhelming sepsis, therefore will not discharge patient. Was initially given vancomycin and Zosyn. Currently off of antibiotics.  Vascular surgery notified,Scheduled for aortogram with left leg runoff on Monday to determine if revascularization is possible by Rosetta Posner, MD.  Acute renal failure (NOT CKD) -Nephrology had been following. They have signed off. Creatinine  stable for 3 days. Continue IV fluids with potassium.  Dehydration - currently on fluids, resolving  Hypokalemia Replete  Tobacco abuse -continue nicotine patch  Alcohol abuse -continue CIWA protocol  COPD -compensated at present   Altered mental status  -Most likely secondary to alcohol abuse with possible alcohol-related  dementia versus simple withdrawal symptoms. Currently stable. V78 and folic acid normal. RPR nonreactive.  Normocytic anemia Hemoglobin has been stable.  DVT prophylaxis: lovenox Code Status: FULL Family Communication: Discussed with patient Disposition Plan: Await angiogram.   Consultants: Nephrology Vascular surgery  Procedure/Significant Events: 8/12 renal ultrasound Small echogenic foci w/i left kidney small nonobstructing stones  Antibiotics: Zosyn given on 8/12  Vancomycin given on 8/12    Objective: Blood pressure 120/76, pulse 103, temperature 98.3 F (36.8 C), temperature source Oral, resp. rate 18, height 5\' 7"  (1.702 m), weight 59.194 kg (130 lb 8 oz), SpO2 100 %.  Intake/Output Summary (Last 24 hours) at 12/12/14 1207 Last data filed at 12/12/14 1157  Gross per 24 hour  Intake   2430 ml  Output   1900 ml  Net    530 ml   Exam: General: Alert and awake, in NAD Lungs: Clear to auscultation bilaterally Cardiovascular: Regular rate and rhythm without murmur gallop or rub Abdomen: Nontender, nondistended, soft, bowel sounds positive, no rebound, no ascites, no appreciable mass Extremities: L 2nd toe black and appears non-vital - L great toe darker than other toes - no purulent d/c - no erythema - no clinical evidence of cellulitis   Data Reviewed: Basic Metabolic Panel:  Recent Labs Lab 12/08/14 0958 12/09/14 0011 12/10/14 0725 12/11/14 0542 12/12/14 0514  NA 141 141 141 140 138  K 3.5 3.2* 3.2* 3.6 3.4*  CL 101 100* 102 102 99*  CO2 30 31 30 29 29   GLUCOSE 102* 109* 97 103* 103*  BUN 11 14 10 10 13   CREATININE 1.83* 2.03* 1.49* 1.35* 1.37*  CALCIUM 8.4* 8.3* 8.5* 8.5* 8.6*   CBC:  Recent Labs Lab 12/06/14 0542 12/11/14 0542  WBC 10.8* 8.7  HGB 9.1* 9.2*  HCT 27.0* 28.2*  MCV 93.4 92.2  PLT 368 312     Scheduled Meds:  Scheduled Meds: . amLODipine  2.5 mg Oral Daily  . enoxaparin (LOVENOX) injection  40 mg Subcutaneous Q24H  .  feeding supplement (NEPRO CARB STEADY)  237 mL Oral TID WC  . folic acid  1 mg Oral Daily  . metoprolol tartrate  12.5 mg Oral BID  . multivitamin with minerals  1 tablet Oral Daily  . nicotine  21 mg Transdermal Daily  . sodium bicarbonate  650 mg Oral TID  . sodium chloride  3 mL Intravenous Q12H  . thiamine  100 mg Oral Daily    Time spent on care of this patient: 25 mins  Reyne Dumas [phone]  [facility] [place] - [phone] Office  [phone]  Contact MD directly via text page:      [email or web address]      password [facility]  12/12/2014, 12:07 PM   LOS: 15 days

## 2014-12-13 NOTE — Progress Notes (Signed)
Progress Note  Leon Foster OQH:[id number] DOB: Aug 07, 1940 DOA: 11/27/2014 PCP: Philis Fendt, MD    Brief Narrative: 74 y.o. M who lives alone w/ a Hx of heavy alcohol (2 bottles of wine/day) and tobacco abuse (1ppd since age 64) who was evaluated by Vascular Surgery/Dr. Ruta Hinds on 11/26/14.  During his w/u labs showed creatinine 5.27 and family was advised to bring patient to [facility] ED. According to family patient had been complaining of left foot pain for approximately a year which progressively got worse over the preceding 2 weeks. One of his sisters looked at his foot and noticed black discoloration of the second left toe. He was taken to urgent care center and then evaluated by vascular surgery who planned for an aortogram and possible intervention. Patient noted to be in renal failure. Vascular surgery waiting for creatinine to improve for angiogram.  Subjective: Patient had questions about his procedure tomorrow  Assessment/Plan:  Dry gangrene left toes - Vasc Surgery has been following. Arteriogram was delayed due to renal function now improved/stabilized. Patient's creatinine has been less than 1.5 for 3 days. Per vascular surgery notes the foot is at high risk for developing infection resulting in overwhelming sepsis, therefore will not discharge patient. Was initially given vancomycin and Zosyn. Currently off of antibiotics.  Vascular surgery notified,Scheduled for aortogram with left leg runoff on Monday to determine if revascularization is possible by Rosetta Posner, MD. NPO after midnight   Acute renal failure (NOT CKD) -Nephrology had been following. They have signed off. Creatinine  stable for 3 days. Continue IV fluids with potassium.  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 Currently stable. T46 and folic acid normal. RPR nonreactive.  Normocytic anemia Hemoglobin has been stable.  DVT prophylaxis: lovenox Code Status: FULL Family Communication: Discussed with patient Disposition Plan: Await angiogram on Monday.   Consultants: Nephrology Vascular surgery  Procedure/Significant Events: 8/12 renal ultrasound Small echogenic foci w/i left kidney small nonobstructing stones  Antibiotics: Zosyn given on 8/12  Vancomycin given on 8/12    Objective: Blood pressure 112/69, pulse 98, temperature 98.3 F (36.8 C), temperature source Oral, resp. rate 18, height 5\' 7"  (1.702 m), weight 59.643 kg (131 lb 7.8 oz), SpO2 100 %.  Intake/Output Summary (Last 24 hours) at 12/13/14 1025 Last data filed at 12/13/14 0906  Gross per 24 hour  Intake   2640 ml  Output   1600 ml  Net   1040 ml   Exam: General: Alert and awake, in NAD Lungs: Clear to auscultation bilaterally Cardiovascular: Regular rate and rhythm without murmur gallop or rub Abdomen: Nontender, nondistended, soft, bowel sounds positive, no rebound, no ascites, no appreciable mass Extremities: L 2nd toe black and appears non-vital - L great toe darker than other toes - no purulent d/c - no erythema - no clinical evidence of cellulitis   Data Reviewed: Basic Metabolic Panel:  Recent Labs Lab 12/08/14 0958 12/09/14 0011 12/10/14 0725 12/11/14 0542 12/12/14 0514  NA 141 141 141 140 138  K 3.5 3.2* 3.2* 3.6 3.4*  CL 101 100* 102 102 99*  CO2 30 31 30 29 29   GLUCOSE 102* 109* 97 103* 103*  BUN 11 14 10 10 13   CREATININE 1.83* 2.03* 1.49* 1.35* 1.37*  CALCIUM  8.4* 8.3* 8.5* 8.5* 8.6*   CBC:  Recent Labs Lab 12/11/14 0542  WBC 8.7  HGB 9.2*  HCT 28.2*  MCV 92.2  PLT 312     Scheduled Meds:  Scheduled Meds: . amLODipine  2.5 mg Oral Daily  . enoxaparin (LOVENOX) injection  40 mg Subcutaneous  Q24H  . feeding supplement (NEPRO CARB STEADY)  237 mL Oral TID WC  . folic acid  1 mg Oral Daily  . metoprolol tartrate  12.5 mg Oral BID  . multivitamin with minerals  1 tablet Oral Daily  . nicotine  21 mg Transdermal Daily  . sodium bicarbonate  650 mg Oral TID  . sodium chloride  3 mL Intravenous Q12H  . thiamine  100 mg Oral Daily    Time spent on care of this patient: 25 mins  Reyne Dumas [phone]  [facility] [place] - [phone] Office  [phone]  Contact MD directly via text page:      [email or web address]      password [facility]  12/13/2014, 10:25 AM   LOS: 16 days

## 2014-12-14 ENCOUNTER — Encounter (HOSPITAL_COMMUNITY): Payer: Self-pay | Admitting: Anesthesiology

## 2014-12-14 ENCOUNTER — Encounter (HOSPITAL_COMMUNITY)
Admission: EM | Disposition: A | Discharge status: Skilled Nursing Facility | Payer: Self-pay | Source: Home / Self Care | Attending: Family Medicine

## 2014-12-14 DIAGNOSIS — I70292 Other atherosclerosis of native arteries of extremities, left leg: Secondary | ICD-10-CM

## 2014-12-14 HISTORY — PX: LOWER EXTREMITY ANGIOGRAM: SHX5508

## 2014-12-14 HISTORY — PX: PERIPHERAL VASCULAR CATHETERIZATION: SHX172C

## 2014-12-14 LAB — COMPREHENSIVE METABOLIC PANEL
ALBUMIN: 2.3 g/dL — AB (ref 3.5–5.0)
ALK PHOS: 46 U/L (ref 38–126)
ALT: 8 U/L — ABNORMAL LOW (ref 17–63)
ANION GAP: 8 (ref 5–15)
AST: 17 U/L (ref 15–41)
BUN: 8 mg/dL (ref 6–20)
CALCIUM: 8.7 mg/dL — AB (ref 8.9–10.3)
CHLORIDE: 102 mmol/L (ref 101–111)
CO2: 30 mmol/L (ref 22–32)
Creatinine, Ser: 1.27 mg/dL — ABNORMAL HIGH (ref 0.61–1.24)
GFR calc Af Amer: >60 mL/min (ref >60)
GFR calc non Af Amer: 55 mL/min — ABNORMAL LOW (ref >60)
GLUCOSE: 101 mg/dL — AB (ref 65–99)
Potassium: 4.1 mmol/L (ref 3.5–5.1)
SODIUM: 140 mmol/L (ref 135–145)
Total Bilirubin: 0.4 mg/dL (ref 0.3–1.2)
Total Protein: 6.6 g/dL (ref 6.5–8.1)

## 2014-12-14 LAB — CBC
HEMATOCRIT: 28.8 % — AB (ref 39.0–52.0)
HEMOGLOBIN: 9.4 g/dL — AB (ref 13.0–17.0)
MCH: 30.1 pg (ref 26.0–34.0)
MCHC: 32.6 g/dL (ref 30.0–36.0)
MCV: 92.3 fL (ref 78.0–100.0)
Platelets: 332 10*3/uL (ref 150–400)
RBC: 3.12 MIL/uL — ABNORMAL LOW (ref 4.22–5.81)
RDW: 17.9 % — ABNORMAL HIGH (ref 11.5–15.5)
WBC: 8.1 10*3/uL (ref 4.0–10.5)

## 2014-12-14 LAB — PROTIME-INR
INR: 1.05 (ref 0.00–1.49)
Prothrombin Time: 13.9 seconds (ref 11.6–15.2)

## 2014-12-14 LAB — POCT ACTIVATED CLOTTING TIME
ACTIVATED CLOTTING TIME: 220 s
ACTIVATED CLOTTING TIME: 245 s
Activated Clotting Time: 177 seconds

## 2014-12-14 SURGERY — ABDOMINAL AORTOGRAM

## 2014-12-14 MED ORDER — HYDRALAZINE HCL 20 MG/ML IJ SOLN: 5.0000 mg | INTRAMUSCULAR | Status: DC | PRN

## 2014-12-14 MED ORDER — ACETAMINOPHEN 325 MG RE SUPP
325.0000 mg | RECTAL | Status: DC | PRN
  Filled 2014-12-14: qty 2

## 2014-12-14 MED ORDER — METOPROLOL TARTRATE 1 MG/ML IV SOLN
2.0000 mg | INTRAVENOUS | Status: DC | PRN
Start: 2014-12-14 — End: 2014-12-15

## 2014-12-14 MED ORDER — HEPARIN SODIUM (PORCINE) 1000 UNIT/ML IJ SOLN
INTRAMUSCULAR | Status: DC | PRN
  Administered 2014-12-14: 3000 [IU] via INTRAVENOUS
  Administered 2014-12-14: 5000 [IU] via INTRAVENOUS

## 2014-12-14 MED ORDER — ONDANSETRON HCL 4 MG/2ML IJ SOLN: 4.0000 mg | Freq: Four times a day (QID) | INTRAMUSCULAR | Status: DC | PRN

## 2014-12-14 MED ORDER — HEPARIN SODIUM (PORCINE) 1000 UNIT/ML IJ SOLN
INTRAMUSCULAR | Status: AC
  Filled 2014-12-14: qty 1

## 2014-12-14 MED ORDER — CLOPIDOGREL BISULFATE 300 MG PO TABS
300.0000 mg | ORAL_TABLET | Freq: Once | ORAL | Status: AC
  Administered 2014-12-14: 300 mg via ORAL

## 2014-12-14 MED ORDER — CLOPIDOGREL BISULFATE 75 MG PO TABS
75.0000 mg | ORAL_TABLET | Freq: Every day | ORAL | Status: DC
  Administered 2014-12-15: 75 mg via ORAL
  Filled 2014-12-14 (×2): qty 1

## 2014-12-14 MED ORDER — HEPARIN (PORCINE) IN NACL 2-0.9 UNIT/ML-% IJ SOLN
INTRAMUSCULAR | Status: AC
  Filled 2014-12-14: qty 1000

## 2014-12-14 MED ORDER — ACETAMINOPHEN 325 MG PO TABS: 325.0000 mg | ORAL_TABLET | ORAL | Status: DC | PRN

## 2014-12-14 MED ORDER — IODIXANOL 320 MG/ML IV SOLN
INTRAVENOUS | Status: DC | PRN
  Administered 2014-12-14: 155 mL via INTRAVENOUS

## 2014-12-14 MED ORDER — SODIUM CHLORIDE 0.45 % IV SOLN
INTRAVENOUS | Status: DC
  Administered 2014-12-14: 600 mL via INTRAVENOUS

## 2014-12-14 MED ORDER — LIDOCAINE HCL (PF) 1 % IJ SOLN
INTRAMUSCULAR | Status: AC
  Filled 2014-12-14: qty 30

## 2014-12-14 MED ORDER — LIDOCAINE HCL (PF) 1 % IJ SOLN
INTRAMUSCULAR | Status: DC | PRN
  Administered 2014-12-14: 12 mL

## 2014-12-14 MED ORDER — LABETALOL HCL 5 MG/ML IV SOLN
10.0000 mg | INTRAVENOUS | Status: DC | PRN
  Administered 2014-12-14: 10 mg via INTRAVENOUS

## 2014-12-14 MED ORDER — CLOPIDOGREL BISULFATE 300 MG PO TABS
ORAL_TABLET | ORAL | Status: AC
  Filled 2014-12-14: qty 1

## 2014-12-14 MED ORDER — LABETALOL HCL 5 MG/ML IV SOLN
INTRAVENOUS | Status: AC
  Filled 2014-12-14: qty 4

## 2014-12-14 SURGICAL SUPPLY — 19 items
BALLOON POWERFLX PRO 5X60X135 (BALLOONS) ×1 IMPLANT
CATH ANGIO 5F PIGTAIL 65CM (CATHETERS) ×1 IMPLANT
CATH CROSS OVER TEMPO 5F (CATHETERS) ×1 IMPLANT
CATH QUICKCROSS SUPP .035X90CM (MICROCATHETER) ×1 IMPLANT
CATH STRAIGHT 5FR 65CM (CATHETERS) ×1 IMPLANT
COVER PRB 48X5XTLSCP FOLD TPE (BAG) IMPLANT
COVER PROBE 5X48 (BAG) ×4
GUIDEWIRE ANGLED .035X150CM (WIRE) ×1 IMPLANT
KIT ENCORE 26 ADVANTAGE (KITS) ×1 IMPLANT
KIT PV (KITS) ×4 IMPLANT
SHEATH PINNACLE 5F 10CM (SHEATH) ×1 IMPLANT
SHEATH PINNACLE MP 7F 45CM (SHEATH) ×1 IMPLANT
STENT SMART CONTROL 6X80X120 (Permanent Stent) ×1 IMPLANT
SYR MEDRAD MARK V 150ML (SYRINGE) ×4 IMPLANT
TRANSDUCER W/STOPCOCK (MISCELLANEOUS) ×4 IMPLANT
TRAY PV CATH (CUSTOM PROCEDURE TRAY) ×4 IMPLANT
WIRE AMPLATZ SS-J .035X180CM (WIRE) ×1 IMPLANT
WIRE HITORQ VERSACORE ST 145CM (WIRE) ×1 IMPLANT
WIRE VERSACORE LOC 115CM (WIRE) ×1 IMPLANT

## 2014-12-14 NOTE — Progress Notes (Signed)
Site area: RFA Site Prior to Removal:  Level 0 Pressure Applied For:25 min Manual:   yes Patient Status During Pull: hypotensive event  Post Pull Site:  Level 0 Post Pull Instructions Given:  yes Post Pull Pulses Present: doppler Dressing Applied:  clear Bedrest begins @ 1735 Comments: SBP down to 44. Pt asymptomatic Loose stool. 100cc bolus given. Little decrease in HR noted.

## 2014-12-14 NOTE — Care Management Note (Signed)
Case Management Note  Patient Details  Name: ESTLE HUGULEY MRN: [id number] Date of Birth: [birth year]  Subjective/Objective:               CM following for progression and d/c planning.     Action/Plan: Noted CM referral for [facility] services, however the plan is for short term SNF and CSW is working with pt and family re SNF, no HH needs at this time.   Expected Discharge Date:  12/11/14               Expected Discharge Plan:  Skilled Nursing Facility  In-House Referral:  Clinical Social Work  Discharge planning Services  CM Consult  Post Acute Care Choice:  NA Choice offered to:  NA  DME Arranged:  N/A DME Agency:  NA  HH Arranged:  NA HH Agency:  NA  Status of Service:  Completed, signed off  Medicare Important Message Given:  Yes-fourth notification given Date Medicare IM Given:    Medicare IM give by:    Date Additional Medicare IM Given:    Additional Medicare Important Message give by:     If discussed at [place] of Stay Meetings, dates discussed:    Additional Comments:  Adron Bene, RN 12/14/2014, 11:03 AM

## 2014-12-14 NOTE — H&P (View-Only) (Signed)
Patient ID: Leon Foster, male   DOB: 01/21/1941, 74 y.o.   MRN: [id number] Stable overall. Denies pain in his left foot.  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 His calf is nontender. Does have calcification of his femoral artery but the palpable femoral pulse.  Renal function has stabilized. Scheduled for aortogram with left leg runoff on Monday to determine if revascularization is possible.

## 2014-12-14 NOTE — Interval H&P Note (Signed)
History and Physical Interval Note:  12/14/2014 2:20 PM  Leon Foster  has presented today for surgery, with the diagnosis of claudication  The various methods of treatment have been discussed with the patient and family. After consideration of risks, benefits and other options for treatment, the patient has consented to  Procedure(s): Abdominal Aortogram w/Lower Extremity (N/A) as a surgical intervention .  The patient's history has been reviewed, patient examined, no change in status, stable for surgery.  I have reviewed the patient's chart and labs.  Questions were answered to the patient's satisfaction.     Ruta Hinds

## 2014-12-14 NOTE — Progress Notes (Signed)
Progress Note  Leon Foster KZS:[id number] DOB: 07/31/1940 DOA: 11/27/2014 PCP: Philis Fendt, MD    Brief Narrative: 74 y.o. M who lives alone w/ a Hx of heavy alcohol (2 bottles of wine/day) and tobacco abuse (1ppd since age 29) who was evaluated by Vascular Surgery/Dr. Ruta Hinds on 11/26/14.  During his w/u labs showed creatinine 5.27 and family was advised to bring patient to [facility] ED. According to family patient had been complaining of left foot pain for approximately a year which progressively got worse over the preceding 2 weeks. One of his sisters looked at his foot and noticed black discoloration of the second left toe. He was taken to urgent care center and then evaluated by vascular surgery who planned for an aortogram and possible intervention. Patient noted to be in renal failure. Vascular surgery was waiting for creatinine to improve for angiogram.  Subjective: Patient denies any complaint. Eager for his angiogram this morning. No questions asked.  Assessment/Plan:  Dry gangrene left toes - Vasc Surgery has been following. Arteriogram was delayed due to renal function, now improved/stabilized. Patient's creatinine has been less than 1.5 for 3 days. Per vascular surgery notes the foot is at high risk for developing infection resulting in overwhelming sepsis. Was initially given vancomycin and Zosyn. Currently off of antibiotics. Scheduled for aortogram with left leg runoff on Monday to determine if revascularization is possible by Rosetta Posner, MD.   Acute renal failure (NOT CKD) -Nephrology had been following. They have signed off. Creatinine stable for 3 days. Continue IV fluids with potassium.  Dehydration Resolved with hydration  Hypokalemia Repleted  Tobacco abuse -continue nicotine patch  Alcohol abuse -continue CIWA protocol. No evidence for alcohol withdrawal syndrome.  COPD Stable  Altered mental status  Now at baseline. 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 D32 and folic acid normal. RPR nonreactive.  Normocytic anemia Hemoglobin has been stable.  DVT prophylaxis: lovenox Code Status: FULL Family Communication: Discussed with patient Disposition Plan: Await angiogram.   Consultants: Nephrology Vascular surgery  Procedure/Significant Events: 8/12 renal ultrasound Small echogenic foci w/i left kidney small nonobstructing stones  Antibiotics: Zosyn given on 8/12  Vancomycin given on 8/12    Objective: Blood pressure 132/87, pulse 75, temperature 98.7 F (37.1 C), temperature source Oral, resp. rate 14, height 5\' 7"  (1.702 m), weight 58.3 kg (128 lb 8.5 oz), SpO2 100 %.  Intake/Output Summary (Last 24 hours) at 12/14/14 0930 Last data filed at 12/14/14 0845  Gross per 24 hour  Intake   2160 ml  Output   1900 ml  Net    260 ml   Exam: General: Alert and awake, in NAD Lungs: Clear to auscultation bilaterally Cardiovascular: Regular rate and rhythm without murmur gallop or rub Abdomen: Nontender, nondistended, soft, bowel sounds positive, no rebound, no ascites, no appreciable mass Extremities: L 2nd toe black and appears non-vital - L great toe darker than other toes - no purulent d/c - no erythema - no clinical evidence of cellulitis. Unchanged.  Data Reviewed: Basic Metabolic Panel:  Recent Labs Lab 12/09/14 0011 12/10/14 0725 12/11/14 0542 12/12/14 0514 12/14/14 0343  NA 141 141 140 138 140  K 3.2* 3.2* 3.6 3.4* 4.1  CL 100* 102 102 99* 102  CO2 31 30 29 29 30   GLUCOSE 109* 97 103* 103* 101*  BUN 14 10 10 13 8   CREATININE 2.03* 1.49* 1.35* 1.37* 1.27*  CALCIUM 8.3* 8.5*  8.5* 8.6* 8.7*   CBC:  Recent Labs Lab 12/11/14 0542 12/14/14 0343  WBC 8.7 8.1  HGB 9.2* 9.4*  HCT 28.2* 28.8*  MCV 92.2 92.3  PLT 312 332     Scheduled Meds:  Scheduled Meds: . amLODipine  2.5 mg Oral Daily  . enoxaparin (LOVENOX)  injection  40 mg Subcutaneous Q24H  . feeding supplement (NEPRO CARB STEADY)  237 mL Oral TID WC  . folic acid  1 mg Oral Daily  . metoprolol tartrate  12.5 mg Oral BID  . multivitamin with minerals  1 tablet Oral Daily  . nicotine  21 mg Transdermal Daily  . sodium bicarbonate  650 mg Oral TID  . sodium chloride  3 mL Intravenous Q12H  . thiamine  100 mg Oral Daily    Time spent on care of this patient: 25 mins  [place] - [phone] Office  [phone]  Contact MD directly via text page:      [email or web address]      password [email or web address]  12/14/2014, 9:30 AM   LOS: 17 days

## 2014-12-14 NOTE — Care Management Important Message (Signed)
Important Message  Patient Details  Name: Leon Foster MRN: [id number] Date of Birth: May 12, 1940   Medicare Important Message Given:  Yes-fourth notification given    Delorse Lek 12/14/2014, 11:14 AM

## 2014-12-14 NOTE — Consult Note (Signed)
Successful left SFA stent.  Pt will need to be on Plavix indefinitely.  Would consult Dr Sharol Given from Orthopedics to consider left transmetatarsal amputation vs. Autoamputation at this point.  Pt will need follow up with [place] in 6 weeks Will recheck creatinine in am  Ruta Hinds, MD [facility] [place]: [phone] Pager: [phone]

## 2014-12-14 NOTE — Progress Notes (Signed)
PT Cancellation Note  Patient Details Name: ZARIAN COLPITTS MRN: [id number] DOB: [birth year]   Cancelled Treatment:    Reason Eval/Treat Not Completed: Patient at procedure or test/unavailable.  Patient out of room at test.  Will return tomorrow for PT session.   Despina Pole 12/14/2014, 1:55 PM Carita Pian. Sanjuana Kava, [place] Pager [phone]

## 2014-12-15 ENCOUNTER — Other Ambulatory Visit: Payer: Self-pay | Admitting: *Deleted

## 2014-12-15 ENCOUNTER — Encounter (HOSPITAL_COMMUNITY): Payer: Self-pay | Admitting: Vascular Surgery

## 2014-12-15 DIAGNOSIS — Z9862 Peripheral vascular angioplasty status: Secondary | ICD-10-CM

## 2014-12-15 DIAGNOSIS — I739 Peripheral vascular disease, unspecified: Secondary | ICD-10-CM

## 2014-12-15 LAB — BASIC METABOLIC PANEL
Anion gap: 8 (ref 5–15)
BUN: 8 mg/dL (ref 6–20)
CHLORIDE: 103 mmol/L (ref 101–111)
CO2: 27 mmol/L (ref 22–32)
CREATININE: 1.24 mg/dL (ref 0.61–1.24)
Calcium: 8.9 mg/dL (ref 8.9–10.3)
GFR calc non Af Amer: 56 mL/min — ABNORMAL LOW (ref >60)
Glucose, Bld: 95 mg/dL (ref 65–99)
POTASSIUM: 3.9 mmol/L (ref 3.5–5.1)
Sodium: 138 mmol/L (ref 135–145)

## 2014-12-15 LAB — CBC
HEMATOCRIT: 28.3 % — AB (ref 39.0–52.0)
Hemoglobin: 9.4 g/dL — ABNORMAL LOW (ref 13.0–17.0)
MCH: 30.3 pg (ref 26.0–34.0)
MCHC: 33.2 g/dL (ref 30.0–36.0)
MCV: 91.3 fL (ref 78.0–100.0)
PLATELETS: 357 10*3/uL (ref 150–400)
RBC: 3.1 MIL/uL — AB (ref 4.22–5.81)
RDW: 17.9 % — ABNORMAL HIGH (ref 11.5–15.5)
WBC: 8.1 10*3/uL (ref 4.0–10.5)

## 2014-12-15 MED ORDER — NICOTINE 21 MG/24HR TD PT24: 21.0000 mg | MEDICATED_PATCH | Freq: Every day | TRANSDERMAL | Status: DC

## 2014-12-15 MED ORDER — METOPROLOL TARTRATE 25 MG PO TABS: 12.5000 mg | ORAL_TABLET | Freq: Two times a day (BID) | ORAL | Status: DC

## 2014-12-15 MED ORDER — OXYCODONE HCL 5 MG PO TABS: 5.0000 mg | ORAL_TABLET | Freq: Four times a day (QID) | ORAL | Status: DC | PRN

## 2014-12-15 MED ORDER — CLOPIDOGREL BISULFATE 75 MG PO TABS: 75.0000 mg | ORAL_TABLET | Freq: Every day | ORAL | Status: AC

## 2014-12-15 MED ORDER — ADULT MULTIVITAMIN W/MINERALS CH: 1.0000 | ORAL_TABLET | Freq: Every day | ORAL | Status: AC

## 2014-12-15 MED ORDER — NEPRO/CARBSTEADY PO LIQD: 237.0000 mL | Freq: Three times a day (TID) | ORAL | Status: DC

## 2014-12-15 MED ORDER — THIAMINE HCL 100 MG PO TABS: 100.0000 mg | ORAL_TABLET | Freq: Every day | ORAL | Status: DC

## 2014-12-15 NOTE — Consult Note (Addendum)
[facility]  Subjective  - toes hurt   Objective 134/89 96 98 F (36.7 C) (Oral) 18 99%  Intake/Output Summary (Last 24 hours) at 12/15/14 0811 Last data filed at 12/15/14 0555  Gross per 24 hour  Intake    275 ml  Output   1950 ml  Net  -1675 ml   Right groin no hematoma Left foot pink warm Dry gangrene toes unchanged  Assessment/Planning: S/p left SFA stent yesterday Plavix indefinitely Pt debating about observing toes for now or amputation.  He would like to discuss with Dr Sharol Given. I called Dr Sharol Given this morning. Check post procedure ABI for new baseline Will sign off at this point.  I will arrange outpt follow up for him with me in 4-6 weeks.  Jari, [place] 12/15/2014 8:11 AM --  Laboratory Lab Results:  Recent Labs  12/14/14 0343 12/15/14 0444  WBC 8.1 8.1  HGB 9.4* 9.4*  HCT 28.8* 28.3*  PLT 332 357   BMET  Recent Labs  12/14/14 0343 12/15/14 0444  NA 140 138  K 4.1 3.9  CL 102 103  CO2 30 27  GLUCOSE 101* 95  BUN 8 8  CREATININE 1.27* 1.24  CALCIUM 8.7* 8.9    COAG Lab Results  Component Value Date   INR 1.05 12/14/2014   INR 1.10 11/27/2014   No results found for: PTT

## 2014-12-15 NOTE — Discharge Instructions (Signed)

## 2014-12-15 NOTE — Discharge Summary (Signed)
[facility]  Physician Discharge Summary   Patient ID: Leon Foster MRN: [id number] DOB/AGE: 09/10/1940 74 y.o.  Admit date: 11/27/2014 Discharge date: 12/15/2014  PCP: Philis Fendt, MD  DISCHARGE DIAGNOSES:  Principal Problem:   Acute renal failure Active Problems:   Gangrene of toe-left second toe   Hypokalemia   Dehydration   Tobacco abuse   Alcohol dependence   Confusion   PAD (peripheral artery disease)   Gangrene of foot   Acute renal failure syndrome   Cardiomyopathy   Alcohol abuse   Delirium   Blood poisoning   Disorientation   Pulmonary hypertension   Leukocytosis   RECOMMENDATIONS FOR OUTPATIENT FOLLOW UP: 1. Check renal function on Thursday 2. Dr. Jess Barters office will call to schedule surgery and provide further instructions.   DISCHARGE CONDITION: fair  Diet recommendation: Heart healthy  Filed Weights   12/13/14 0417 12/14/14 0600 12/15/14 0555  Weight: 59.643 kg (131 lb 7.8 oz) 58.3 kg (128 lb 8.5 oz) 55.1 kg (121 lb 7.6 oz)    INITIAL HISTORY: 74 y.o. M who lives alone w/ a Hx of heavy alcohol (2 bottles of wine/day) and tobacco abuse (1ppd since age 56) who was evaluated by Vascular Surgery/Dr. Ruta Hinds on 11/26/14. During his w/u labs showed creatinine 5.27 and family was advised to bring patient to [facility] ED. According to family patient had been complaining of left foot pain for approximately a year which progressively got worse over the preceding 2 weeks. One of his sisters looked at his foot and noticed black discoloration of the second left toe. He was taken to urgent care center and then evaluated by vascular surgery who planned for an aortogram and possible intervention. Patient noted to be in renal failure. Vascular surgery was waiting for creatinine to improve for angiogram.  Consultations:  Vascular surgery: Dr. Oneida Alar  Orthopedics: Dr. Sharol Given  Nephrology  Procedures:  Aortogram and lower extremity angiogram with  stent placement in the superficial femoral artery on the left   HOSPITAL COURSE:   Dry gangrene left toes/peripheral vascular disease Vascular surgery was consulted. Due to renal failure there was a delay in obtaining an angiogram. After renal function improved Angiogram was done. A stent was placed in the left superficial femoral artery. Plavix to continue indefinitely. Patient was seen by orthopedics regarding his ischemic foot. Patient will need a transmetatarsal amputation. This will be scheduled for Friday. There are no signs of infection currently. Patient was given antibiotics initially. Okay for discharge per orthopedics. Patient will be discharged to skilled nursing facility and surgery will be scheduled by orthopedics for this Friday afternoon.   Acute renal failure (NOT CKD) Patient was seen by nephrology. He was given IV fluids. Creatinine has become normal. His acidosis is also corrected. Check renal function on Thursday at the skilled nursing facility.  Dehydration Resolved with hydration  Hypokalemia Repleted  Tobacco abuse Continue nicotine patch  Alcohol abuse Patient was placed on CIWA protocol. No evidence for alcohol withdrawal syndrome. Continue multivitamins and thiamine.  COPD Stable  Altered mental status  Patient was initially confused. This was likely secondary to alcohol abuse and acute illness. O-37 level, folic acid and RPR were normal. TSH was normal earlier this month. Mental status is at baseline.  Normocytic anemia Hemoglobin has been stable.  Patient is stable. Since there is a definite plan for surgery only on Friday, patient doesn't particularly need to stay in the hospital till then. Discussed with Dr. Sharol Given, with the patient and  with his sister. All are in agreement for the patient to go to skilled nursing facility in the interim. He will be discharged today.  PERTINENT LABS:  The results of significant diagnostics from this hospitalization  (including imaging, microbiology, ancillary and laboratory) are listed below for reference.      Labs: Basic Metabolic Panel:  Recent Labs Lab 12/10/14 0725 12/11/14 0542 12/12/14 0514 12/14/14 0343 12/15/14 0444  NA 141 140 138 140 138  K 3.2* 3.6 3.4* 4.1 3.9  CL 102 102 99* 102 103  CO2 30 29 29 30 27   GLUCOSE 97 103* 103* 101* 95  BUN 10 10 13 8 8   CREATININE 1.49* 1.35* 1.37* 1.27* 1.24  CALCIUM 8.5* 8.5* 8.6* 8.7* 8.9   Liver Function Tests:  Recent Labs Lab 12/12/14 0514 12/14/14 0343  AST 18 17  ALT 9* 8*  ALKPHOS 43 46  BILITOT 0.4 0.4  PROT 7.4 6.6  ALBUMIN 2.3* 2.3*   CBC:  Recent Labs Lab 12/11/14 0542 12/14/14 0343 12/15/14 0444  WBC 8.7 8.1 8.1  HGB 9.2* 9.4* 9.4*  HCT 28.2* 28.8* 28.3*  MCV 92.2 92.3 91.3  PLT 312 332 357    IMAGING STUDIES Dg Chest 2 View  11/27/2014   CLINICAL DATA:  Ischemic toes and elevated white blood cell count.  EXAM: CHEST  2 VIEW  COMPARISON:  09/26/2004  FINDINGS: The cardiac silhouette, mediastinal and hilar contours are within normal limits and stable. There is mild tortuosity of the thoracic aorta. The lungs demonstrate mild stable emphysematous changes and hyperinflation. Bilateral nipple shadows are noted. No worrisome lesions. No acute pulmonary findings or pleural effusion. Remote healed bilateral rib fractures are noted.  IMPRESSION: Emphysematous changes but no acute pulmonary findings.   Electronically Signed   By: Marijo Sanes M.D.   On: 11/27/2014 11:12   US Renal  11/27/2014   CLINICAL DATA:  Acute renal failure  EXAM: RENAL / URINARY TRACT ULTRASOUND COMPLETE  COMPARISON:  None.  FINDINGS: Right Kidney:  Length: 10.1 cm. Echogenicity within normal limits. No mass or hydronephrosis visualized.  Left Kidney:  Length: 9.9 cm. Echogenicity within normal limits. No mass or hydronephrosis visualized. A few small echogenic foci are identified which may represent small stones. No obstructive changes are noted.   Bladder:  Decompressed by a Foley catheter  IMPRESSION: Small echogenic foci within the left kidney which may represent small nonobstructing stones.  No other focal abnormality is noted.   Electronically Signed   By: Inez Catalina M.D.   On: 11/27/2014 20:48   Dg Foot Complete Left  11/26/2014   CLINICAL DATA:  Left second toe coldness and black tennis  EXAM: LEFT FOOT - COMPLETE 3+ VIEW  COMPARISON:  None.  FINDINGS: Tarsal-metatarsal alignment is normal. No evidence of osteomyelitis is seen. No or definite erosion is seen. However, a small erosion involving the base of the proximal phalanx of the left great toe cannot be excluded on the oblique view. Is there any history of gout?  IMPRESSION: No evidence of osteomyelitis. Difficult to exclude a small erosion involving the base of the proximal phalanx of the left great toe.   Electronically Signed   By: Ivar Drape M.D.   On: 11/26/2014 13:17    DISCHARGE EXAMINATION: Filed Vitals:   12/14/14 1930 12/14/14 2106 12/15/14 0555 12/15/14 1308  BP: 128/79 116/76 134/89 105/57  Pulse:  78 96 90  Temp:   98 F (36.7 C) 98.6 F (37 C)  TempSrc:  Oral Oral  Resp:   18 18  Height:      Weight:   55.1 kg (121 lb 7.6 oz)   SpO2:   99% 100%   General appearance: alert, cooperative, appears stated age and no distress Cardio: regular rate and rhythm, S1, S2 normal, no murmur, click, rub or gallop GI: soft, non-tender; bowel sounds normal; no masses,  no organomegaly Extremities: Gangrenous toes involving the left foot. No erythema.  DISPOSITION: SNF: [facility]  Discharge Instructions    Call MD for:  extreme fatigue    Complete by:  As directed      Call MD for:  persistant dizziness or light-headedness    Complete by:  As directed      Call MD for:  redness, tenderness, or signs of infection (pain, swelling, redness, odor or green/yellow discharge around incision site)    Complete by:  As directed      Call MD for:  severe uncontrolled pain     Complete by:  As directed      Call MD for:  temperature >100.4    Complete by:  As directed      Diet - low sodium heart healthy    Complete by:  As directed      Discharge instructions    Complete by:  As directed   Dr. Sharol Given to schedule transmetatarsal amputation of right foot for this Friday. No restrictions.   You were cared for by a hospitalist during your hospital stay. If you have any questions about your discharge medications or the care you received while you were in the hospital after you are discharged, you can call the unit and asked to speak with the hospitalist on call if the hospitalist that took care of you is not available. Once you are discharged, your primary care physician will handle any further medical issues. Please note that NO REFILLS for any discharge medications will be authorized once you are discharged, as it is imperative that you return to your primary care physician (or establish a relationship with a primary care physician if you do not have one) for your aftercare needs so that they can reassess your need for medications and monitor your lab values. If you do not have a primary care physician, you can call [phone] for a physician referral.     Increase activity slowly    Complete by:  As directed            ALLERGIES: No Known Allergies   Current Discharge Medication List    START taking these medications   Details  clopidogrel (PLAVIX) 75 MG tablet Take 1 tablet (75 mg total) by mouth daily with breakfast.    metoprolol tartrate (LOPRESSOR) 25 MG tablet Take 0.5 tablets (12.5 mg total) by mouth 2 (two) times daily.    Multiple Vitamin (MULTIVITAMIN WITH MINERALS) TABS tablet Take 1 tablet by mouth daily.    nicotine (NICODERM CQ - DOSED IN MG/24 HOURS) 21 mg/24hr patch Place 1 patch (21 mg total) onto the skin daily. Qty: 28 patch, Refills: 0    Nutritional Supplements (FEEDING SUPPLEMENT, NEPRO CARB STEADY,) LIQD Take 237 mLs by mouth 3 (three)  times daily with meals. Refills: 0    oxyCODONE (OXY IR/ROXICODONE) 5 MG immediate release tablet Take 1 tablet (5 mg total) by mouth every 6 (six) hours as needed for moderate pain. Qty: 30 tablet, Refills: 0    thiamine 100 MG tablet Take 1 tablet (100 mg total) by  mouth daily.      CONTINUE these medications which have NOT CHANGED   Details  acetaminophen (TYLENOL) 325 MG tablet Take 650 mg by mouth every 6 (six) hours as needed for headache.    cyanocobalamin 500 MCG tablet Take 500 mcg by mouth daily.    folic acid (FOLVITE) 1 MG tablet TAKE 1 TAB BY MOUTH ONCE DAILY Refills: 11      STOP taking these medications     ciprofloxacin (CIPRO) 500 MG tablet      potassium chloride SA (K-DUR,KLOR-CON) 20 MEQ tablet        Follow-up Information    Follow up with Ruta Hinds, MD In 4 weeks.   Specialties:  Vascular Surgery, Cardiology   Why:  Our office will call you to arrange an appointment (sent)   Contact information:   [place] [address] [phone]       Follow up with Newt Minion, MD.   Specialty:  Orthopedic Surgery   Why:  his office will schedule surgery   Contact information:   [address] [place] [address] [phone]       TOTAL DISCHARGE TIME: 30 minutes  [facility] Pager [phone]  12/15/2014, 1:24 PM

## 2014-12-15 NOTE — Consult Note (Signed)
Reason for Consult:  Dry gangrene left forefoot Physician: Dr Dimitri Ped is an 74 y.o. male.  Leon Foster is a 74 year old gentleman with severe peripheral vascular disease status post revascularization of his SFA with Dr. Oneida Alar. Patient has dry gangrene of the entire forefoot.  Past Medical History  Diagnosis Date  . Tobacco dependence   . Alcoholic   . AVM (arteriovenous malformation) of colon     colonoscopy 2011 [facility]     Past Surgical History  Procedure Laterality Date  . Peripheral vascular catheterization N/A 12/14/2014    Procedure: Abdominal Aortogram;  Surgeon: Elam Dutch, MD;  Location: [facility];  Service: Cardiovascular;  Laterality: N/A;  . Lower extremity angiogram Bilateral 12/14/2014    Procedure: Lower Extremity Angiogram;  Surgeon: Elam Dutch, MD;  Location: [facility];  Service: Cardiovascular;  Laterality: Bilateral;  . Peripheral vascular catheterization Left 12/14/2014    Procedure: Peripheral Vascular Intervention;  Surgeon: Elam Dutch, MD;  Location: [facility];  Service: Cardiovascular;  Laterality: Left;  SFA    Family History  Problem Relation Age of Onset  . Stroke Sister   . Deep vein thrombosis Sister   . Cancer Brother   . Diabetes Brother   . Hyperlipidemia Brother   . Hypertension Brother   . Heart disease Father   . Hypertension Sister     Social History:  reports that he has been smoking.  He has never used smokeless tobacco. He reports that he drinks alcohol. He reports that he does not use illicit drugs.  Allergies: No Known Allergies  Medications: I have reviewed the patient's current medications.  Results for orders placed or performed during the hospital encounter of 11/27/14 (from the past 48 hour(s))  Protime-INR     Status: None   Collection Time: 12/14/14  3:43 AM  Result Value Ref Range   Prothrombin Time 13.9 11.6 - 15.2 seconds   INR 1.05 0.00 - 1.49   Comprehensive metabolic panel     Status: Abnormal   Collection Time: 12/14/14  3:43 AM  Result Value Ref Range   Sodium 140 135 - 145 mmol/L   Potassium 4.1 3.5 - 5.1 mmol/L    Comment: DELTA CHECK NOTED   Chloride 102 101 - 111 mmol/L   CO2 30 22 - 32 mmol/L   Glucose, Bld 101 (H) 65 - 99 mg/dL   BUN 8 6 - 20 mg/dL   Creatinine, Ser 1.27 (H) 0.61 - 1.24 mg/dL   Calcium 8.7 (L) 8.9 - 10.3 mg/dL   Total Protein 6.6 6.5 - 8.1 g/dL   Albumin 2.3 (L) 3.5 - 5.0 g/dL   AST 17 15 - 41 U/L   ALT 8 (L) 17 - 63 U/L   Alkaline Phosphatase 46 38 - 126 U/L   Total Bilirubin 0.4 0.3 - 1.2 mg/dL   GFR calc non Af Amer 55 (L) >60 mL/min   GFR calc Af Amer >60 >60 mL/min    Comment: (NOTE) The eGFR has been calculated using the CKD EPI equation. This calculation has not been validated in all clinical situations. eGFR's persistently <60 mL/min signify possible Chronic Kidney Disease.    Anion gap 8 5 - 15  CBC     Status: Abnormal   Collection Time: 12/14/14  3:43 AM  Result Value Ref Range   WBC 8.1 4.0 - 10.5 K/uL   RBC 3.12 (L) 4.22 - 5.81 MIL/uL  Hemoglobin 9.4 (L) 13.0 - 17.0 g/dL   HCT 28.8 (L) 39.0 - 52.0 %   MCV 92.3 78.0 - 100.0 fL   MCH 30.1 26.0 - 34.0 pg   MCHC 32.6 30.0 - 36.0 g/dL   RDW 17.9 (H) 11.5 - 15.5 %   Platelets 332 150 - 400 K/uL  POCT Activated clotting time     Status: None   Collection Time: 12/14/14  3:09 PM  Result Value Ref Range   Activated Clotting Time 220 seconds  POCT Activated clotting time     Status: None   Collection Time: 12/14/14  3:32 PM  Result Value Ref Range   Activated Clotting Time 245 seconds  POCT Activated clotting time     Status: None   Collection Time: 12/14/14  5:01 PM  Result Value Ref Range   Activated Clotting Time 177 seconds  Basic metabolic panel     Status: Abnormal   Collection Time: 12/15/14  4:44 AM  Result Value Ref Range   Sodium 138 135 - 145 mmol/L   Potassium 3.9 3.5 - 5.1 mmol/L   Chloride 103 101 - 111  mmol/L   CO2 27 22 - 32 mmol/L   Glucose, Bld 95 65 - 99 mg/dL   BUN 8 6 - 20 mg/dL   Creatinine, Ser 1.24 0.61 - 1.24 mg/dL   Calcium 8.9 8.9 - 10.3 mg/dL   GFR calc non Af Amer 56 (L) >60 mL/min   GFR calc Af Amer >60 >60 mL/min    Comment: (NOTE) The eGFR has been calculated using the CKD EPI equation. This calculation has not been validated in all clinical situations. eGFR's persistently <60 mL/min signify possible Chronic Kidney Disease.    Anion gap 8 5 - 15  CBC     Status: Abnormal   Collection Time: 12/15/14  4:44 AM  Result Value Ref Range   WBC 8.1 4.0 - 10.5 K/uL   RBC 3.10 (L) 4.22 - 5.81 MIL/uL   Hemoglobin 9.4 (L) 13.0 - 17.0 g/dL   HCT 28.3 (L) 39.0 - 52.0 %   MCV 91.3 78.0 - 100.0 fL   MCH 30.3 26.0 - 34.0 pg   MCHC 33.2 30.0 - 36.0 g/dL   RDW 17.9 (H) 11.5 - 15.5 %   Platelets 357 150 - 400 K/uL    No results found.  Review of Systems  All other systems reviewed and are negative.  Blood pressure 134/89, pulse 96, temperature 98 F (36.7 C), temperature source Oral, resp. rate 18, height '5\' 7"'  (1.702 m), weight 55.1 kg (121 lb 7.6 oz), SpO2 99 %. Physical Exam  on examination patient's left lower extremity is warm he has dry gangrene of the forefoot with no cellulitis no drainage no odor no signs of any deep infection. The hindfoot has no ischemic ulcers.  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  Plan: Patient will require a transmetatarsal amputation. Patient will be scheduled for surgery for Friday afternoon. Patient may stay inpatient or may be discharged and plan for outpatient surgery on Friday.  Taela Charbonneau V 12/15/2014, 10:44 AM

## 2014-12-15 NOTE — Progress Notes (Signed)
Physical Therapy Treatment Patient Details Name: Leon Foster MRN: [id number] DOB: 1940-12-27 Today's Date: 12/15/2014    History of Present Illness 74 y.o. M who lives alone w/ a Hx of heavy alcohol (2 bottles of wine/day) and tobacco abuse (1ppd since age 74) who was evaluated by Vascular Surgery/Dr. Ruta Hinds on 11/26/14. During his w/u labs showed creatinine 5.27 and family was advised to bring patient to [facility] ED. Dry gangrene left second metatarsal w/ sepsis unspecified organism Vasc Surgery is following, w/ arteriogram on hold until renal function improves--completed with Lt SFA stent 8/29    PT Comments    Patient progressing with gait and understanding step-to pattern for minimizing pain Lt foot. (Dr. Oneida Alar in during session and no weight-bearing restrictions). Pt continues to require education for safe use of RW and exercise program for strengthening.   Follow Up Recommendations  SNF     Equipment Recommendations  Rolling walker with 5" wheels;3in1 (PT)    Recommendations for Other Services       Precautions / Restrictions Precautions Precautions: Fall Restrictions Weight Bearing Restrictions: No    Mobility  Bed Mobility Overal bed mobility: Modified Independent Bed Mobility: Supine to Sit     Supine to sit: Modified independent (Device/Increase time)     General bed mobility comments: incr time, no rail, HOB 0  Transfers Overall transfer level: Needs assistance Equipment used: Rolling walker (2 wheeled) Transfers: Sit to/from Stand Sit to Stand: Min guard         General transfer comment: x 2; Verbal cues for hand placement each time (after attempting questioning cues).    Ambulation/Gait Ambulation/Gait assistance: Min assist Ambulation Distance (Feet): 74 Feet Assistive device: Rolling walker (2 wheeled) Gait Pattern/deviations: Step-to pattern;Decreased stride length;Decreased weight shift to left;Antalgic Gait velocity: Decreased.    General Gait Details: Verbal cues for safe use of RW (especially with turns and keeping feet within RW; also when approaching seat not to "park" RW). Patient with antalgic gait, reporting pain Lt foot.  Limited distance due to pain.   Stairs            Wheelchair Mobility    Modified Rankin (Stroke Patients Only)       Balance           Standing balance support: Bilateral upper extremity supported Standing balance-Leahy Scale: Poor                      Cognition Arousal/Alertness: Awake/alert Behavior During Therapy: Flat affect Overall Cognitive Status: No family/caregiver present to determine baseline cognitive functioning (functional for tasks assessed)                      Exercises General Exercises - Lower Extremity Ankle Circles/Pumps: AROM;Both;10 reps Quad Sets: AROM;Both;10 reps Straight Leg Raises: AROM;Both;5 reps    General Comments        Pertinent Vitals/Pain Pain Assessment: 0-10 Pain Score: 8  Pain Location: Lt foot Pain Intervention(s): Limited activity within patient's tolerance;Monitored during session;Repositioned;Patient requesting pain meds-RN notified    Home Living                      Prior Function            PT Goals (current goals can now be found in the care plan section) Acute Rehab PT Goals Patient Stated Goal: agrees wants to get stronger, more mobile Time For Goal Achievement: 12/21/14 Progress towards PT goals:  Progressing toward goals    Frequency  Min 3X/week    PT Plan Current plan remains appropriate    Co-evaluation             End of Session Equipment Utilized During Treatment: Gait belt Activity Tolerance: Patient limited by pain Patient left: in chair;with call bell/phone within reach     Time: 0758-0821 PT Time Calculation (min) (ACUTE ONLY): 23 min  Charges:  $Gait Training: 8-22 mins $Therapeutic Exercise: 8-22 mins                    G Codes:       Meldrick Buttery 13-Jan-2015, 8:31 AM Pager [phone]

## 2014-12-15 NOTE — Progress Notes (Signed)
Patient will discharge to [facility] Anticipated discharge date: 12/15/14 Family notified: pt sisters Transportation by family- pt sister- anticipate coming at 2:30pm  [place] signing off.  Domenica Reamer, [place] Social Worker [phone]

## 2014-12-16 ENCOUNTER — Other Ambulatory Visit (HOSPITAL_COMMUNITY): Payer: Self-pay | Admitting: Orthopedic Surgery

## 2014-12-17 ENCOUNTER — Encounter (HOSPITAL_COMMUNITY): Payer: Self-pay | Admitting: *Deleted

## 2014-12-17 NOTE — Progress Notes (Signed)
EKG, Echo and discharge note shown to Dr. Linna Caprice. Per Dr. Linna Caprice, will evaluate pt when he arrives for surgery tomorrow.  Spoke with Verline Lema, nurse for pt at [facility]. Medical history verified. She states pt is alert and oriented, family very supportive. She states BP was low today, questioning whether or not Metoprolol should be given in AM. I instructed her to follow any instructions the nursing facility has about giving the medication if BP is low. Otherwise, Metoprolol is to be given in AM prior to arrival. Pt's time of arrival is 11:30 AM. He will be transported by [facility] transportation.

## 2014-12-18 ENCOUNTER — Encounter (HOSPITAL_COMMUNITY): Payer: Self-pay | Admitting: Certified Registered Nurse Anesthetist

## 2014-12-18 ENCOUNTER — Encounter (HOSPITAL_COMMUNITY)
Admission: RE | Disposition: A | Discharge status: Skilled Nursing Facility | Payer: Self-pay | Source: Ambulatory Visit | Attending: Orthopedic Surgery

## 2014-12-18 ENCOUNTER — Ambulatory Visit (HOSPITAL_COMMUNITY)
Admission: RE | Admit: 2014-12-18 | Discharge: 2014-12-18 | Disposition: A | Discharge status: Skilled Nursing Facility | Payer: No Typology Code available for payment source | Source: Ambulatory Visit | Attending: Orthopedic Surgery | Admitting: Orthopedic Surgery

## 2014-12-18 ENCOUNTER — Telehealth: Payer: Self-pay | Admitting: Vascular Surgery

## 2014-12-18 DIAGNOSIS — I272 Other secondary pulmonary hypertension: Secondary | ICD-10-CM | POA: Diagnosis not present

## 2014-12-18 DIAGNOSIS — Z5309 Procedure and treatment not carried out because of other contraindication: Secondary | ICD-10-CM | POA: Diagnosis not present

## 2014-12-18 DIAGNOSIS — E1152 Type 2 diabetes mellitus with diabetic peripheral angiopathy with gangrene: Secondary | ICD-10-CM | POA: Insufficient documentation

## 2014-12-18 DIAGNOSIS — F1020 Alcohol dependence, uncomplicated: Secondary | ICD-10-CM | POA: Diagnosis not present

## 2014-12-18 DIAGNOSIS — I429 Cardiomyopathy, unspecified: Secondary | ICD-10-CM | POA: Diagnosis not present

## 2014-12-18 DIAGNOSIS — F17200 Nicotine dependence, unspecified, uncomplicated: Secondary | ICD-10-CM | POA: Diagnosis not present

## 2014-12-18 HISTORY — DX: Gangrene, not elsewhere classified: I96

## 2014-12-18 HISTORY — DX: Disorientation, unspecified: R41.0

## 2014-12-18 HISTORY — DX: Elevated white blood cell count, unspecified: D72.829

## 2014-12-18 HISTORY — DX: Pulmonary hypertension, unspecified: I27.20

## 2014-12-18 HISTORY — DX: Acute kidney failure, unspecified: N17.9

## 2014-12-18 HISTORY — DX: Peripheral vascular disease, unspecified: I73.9

## 2014-12-18 HISTORY — DX: Cardiomyopathy, unspecified: I42.9

## 2014-12-18 LAB — COMPREHENSIVE METABOLIC PANEL
ALBUMIN: 2.6 g/dL — AB (ref 3.5–5.0)
ALK PHOS: 51 U/L (ref 38–126)
ALT: 10 U/L — AB (ref 17–63)
ANION GAP: 6 (ref 5–15)
AST: 18 U/L (ref 15–41)
BUN: 8 mg/dL (ref 6–20)
CALCIUM: 8.9 mg/dL (ref 8.9–10.3)
CO2: 27 mmol/L (ref 22–32)
Chloride: 106 mmol/L (ref 101–111)
Creatinine, Ser: 1.34 mg/dL — ABNORMAL HIGH (ref 0.61–1.24)
GFR calc Af Amer: 59 mL/min — ABNORMAL LOW (ref >60)
GFR calc non Af Amer: 51 mL/min — ABNORMAL LOW (ref >60)
GLUCOSE: 100 mg/dL — AB (ref 65–99)
Potassium: 4.2 mmol/L (ref 3.5–5.1)
SODIUM: 139 mmol/L (ref 135–145)
Total Bilirubin: 0.2 mg/dL — ABNORMAL LOW (ref 0.3–1.2)
Total Protein: 7.2 g/dL (ref 6.5–8.1)

## 2014-12-18 LAB — CBC WITH DIFFERENTIAL/PLATELET
BASOS ABS: 0.1 10*3/uL (ref 0.0–0.1)
BASOS PCT: 2 % — AB (ref 0–1)
EOS ABS: 0.5 10*3/uL (ref 0.0–0.7)
Eosinophils Relative: 8 % — ABNORMAL HIGH (ref 0–5)
HEMATOCRIT: 29.9 % — AB (ref 39.0–52.0)
HEMOGLOBIN: 9.8 g/dL — AB (ref 13.0–17.0)
Lymphocytes Relative: 34 % (ref 12–46)
Lymphs Abs: 2 10*3/uL (ref 0.7–4.0)
MCH: 30.8 pg (ref 26.0–34.0)
MCHC: 32.8 g/dL (ref 30.0–36.0)
MCV: 94 fL (ref 78.0–100.0)
Monocytes Absolute: 0.9 10*3/uL (ref 0.1–1.0)
Monocytes Relative: 15 % — ABNORMAL HIGH (ref 3–12)
NEUTROS ABS: 2.4 10*3/uL (ref 1.7–7.7)
NEUTROS PCT: 41 % — AB (ref 43–77)
Platelets: 405 10*3/uL — ABNORMAL HIGH (ref 150–400)
RBC: 3.18 MIL/uL — AB (ref 4.22–5.81)
RDW: 17.8 % — ABNORMAL HIGH (ref 11.5–15.5)
WBC: 5.8 10*3/uL (ref 4.0–10.5)

## 2014-12-18 LAB — PROTIME-INR
INR: 1.13 (ref 0.00–1.49)
Prothrombin Time: 14.6 seconds (ref 11.6–15.2)

## 2014-12-18 LAB — TYPE AND SCREEN
ABO/RH(D): A POS
Antibody Screen: NEGATIVE

## 2014-12-18 LAB — ABO/RH: ABO/RH(D): A POS

## 2014-12-18 SURGERY — AMPUTATION, FOOT, PARTIAL
Anesthesia: General | Laterality: Left

## 2014-12-18 MED ORDER — LACTATED RINGERS IV SOLN
INTRAVENOUS | Status: DC
  Administered 2014-12-18: 50 mL/h via INTRAVENOUS

## 2014-12-18 MED ORDER — CEFAZOLIN SODIUM-DEXTROSE 2-3 GM-% IV SOLR
2.0000 g | INTRAVENOUS | Status: DC
  Filled 2014-12-18: qty 50

## 2014-12-18 NOTE — Interval H&P Note (Signed)
History and Physical Interval Note:  12/18/2014 1:42 PM  Leon Foster  has presented today for surgery, with the diagnosis of Gangrene Left Foot  The various methods of treatment have been discussed with the patient and family. After consideration of risks, benefits and other options for treatment, the patient has consented to  Procedure(s): Left Transmetatarsal Amputation (Left) as a surgical intervention .  The patient's history has been reviewed, patient examined, no change in status, stable for surgery.  I have reviewed the patient's chart and labs.  Questions were answered to the patient's satisfaction.     DUDA,MARCUS V

## 2014-12-18 NOTE — Telephone Encounter (Addendum)
-----   Message from Mena Goes, RN sent at 12/15/2014  9:15 AM EDT ----- Regarding: Schedule I added ABI with Duplex per protocol  ----- Message -----    From: Elam Dutch, MD    Sent: 12/15/2014   8:15 AM      To: Vvs Charge Pool  Daily visit charge today to check puncture site and foot.  He needs follow up with me in 6 weeks with left leg duplex at that time.   Leon Foster   notified bettye howard of patient's fu appt. with dr. Oneida Alar on 01-21-15 at 12pm for lab and 1:45 with md

## 2014-12-18 NOTE — H&P (Signed)
Leon Foster is an 74 y.o. male.   Chief Complaint: Dry gangrene left forefoot HPI: Patient is a 74 year old gentleman status post revascularization to the left lower extremity. Patient has dry gangrene of the forefoot and presents at this time for transmetatarsal amputation.  Past Medical History  Diagnosis Date  . Tobacco dependence   . Alcoholic   . AVM (arteriovenous malformation) of colon     colonoscopy 2011 [facility]   . Cardiomyopathy     from recent hospitalization notes  . Acute renal failure 11/2014    from recent hospitalization notes  . Gangrene of digit     left toe  . Confusion   . PAD (peripheral artery disease)   . Leukocytosis   . Pulmonary hypertension     Past Surgical History  Procedure Laterality Date  . Peripheral vascular catheterization N/A 12/14/2014    Procedure: Abdominal Aortogram;  Surgeon: Elam Dutch, MD;  Location: [place];  Service: Cardiovascular;  Laterality: N/A;  . Lower extremity angiogram Bilateral 12/14/2014    Procedure: Lower Extremity Angiogram;  Surgeon: Elam Dutch, MD;  Location: [facility];  Service: Cardiovascular;  Laterality: Bilateral;  . Peripheral vascular catheterization Left 12/14/2014    Procedure: Peripheral Vascular Intervention;  Surgeon: Elam Dutch, MD;  Location: [facility];  Service: Cardiovascular;  Laterality: Left;  SFA    Family History  Problem Relation Age of Onset  . Stroke Sister   . Deep vein thrombosis Sister   . Cancer Brother   . Diabetes Brother   . Hyperlipidemia Brother   . Hypertension Brother   . Heart disease Father   . Hypertension Sister    Social History:  reports that he has been smoking.  He has never used smokeless tobacco. He reports that he drinks alcohol. He reports that he does not use illicit drugs.  Allergies: No Known Allergies  No prescriptions prior to admission    No results found for this or any previous visit (from the past 48  hour(s)). No results found.  Review of Systems  All other systems reviewed and are negative.   There were no vitals taken for this visit. Physical Exam  On examination patient has dry gangrene of all the toes of the forefoot. Assessment/Plan Assessment dry gangrene left forefoot.  Plan: We'll plan for transmetatarsal amputation. Risk and benefits were discussed including risk of the wound not healing. Patient states he understands and wishes to proceed at this time.  Rydell Wiegel V 12/18/2014, 6:31 AM

## 2014-12-18 NOTE — Telephone Encounter (Deleted)
-----   Message from Mena Goes, RN sent at 12/15/2014  9:15 AM EDT ----- Regarding: Schedule I added ABI with Duplex per protocol  ----- Message -----    From: Elam Dutch, MD    Sent: 12/15/2014   8:15 AM      To: Vvs Charge Pool  Daily visit charge today to check puncture site and foot.  He needs follow up with me in 6 weeks with left leg duplex at that time.   Ruta Hinds

## 2014-12-18 NOTE — H&P (View-Only) (Signed)
Reason for Consult:  Dry gangrene left forefoot Physician: Dr Dimitri Ped is an 74 y.o. male.  WLS:LHTDSKA is a 74 year old gentleman with severe peripheral vascular disease status post revascularization of his SFA with Dr. Oneida Alar. Patient has dry gangrene of the entire forefoot.  Past Medical History  Diagnosis Date  . Tobacco dependence   . Alcoholic   . AVM (arteriovenous malformation) of colon     colonoscopy 2011  [facility]     Past Surgical History  Procedure Laterality Date  . Peripheral vascular catheterization N/A 12/14/2014    Procedure: Abdominal Aortogram;  Surgeon: Elam Dutch, MD;  Location: [place] [facility];  Service: Cardiovascular;  Laterality: N/A;  . Lower extremity angiogram Bilateral 12/14/2014    Procedure: Lower Extremity Angiogram;  Surgeon: Elam Dutch, MD;  Location: [place] [facility];  Service: Cardiovascular;  Laterality: Bilateral;  . Peripheral vascular catheterization Left 12/14/2014    Procedure: Peripheral Vascular Intervention;  Surgeon: Elam Dutch, MD;  Location: [facility];  Service: Cardiovascular;  Laterality: Left;  SFA    Family History  Problem Relation Age of Onset  . Stroke Sister   . Deep vein thrombosis Sister   . Cancer Brother   . Diabetes Brother   . Hyperlipidemia Brother   . Hypertension Brother   . Heart disease Father   . Hypertension Sister     Social History:  reports that he has been smoking.  He has never used smokeless tobacco. He reports that he drinks alcohol. He reports that he does not use illicit drugs.  Allergies: No Known Allergies  Medications: I have reviewed the patient's current medications.  Results for orders placed or performed during the hospital encounter of 11/27/14 (from the past 48 hour(s))  Protime-INR     Status: None   Collection Time: 12/14/14  3:43 AM  Result Value Ref Range   Prothrombin Time 13.9 11.6 - 15.2 seconds   INR 1.05 0.00 - 1.49   Comprehensive metabolic panel     Status: Abnormal   Collection Time: 12/14/14  3:43 AM  Result Value Ref Range   Sodium 140 135 - 145 mmol/L   Potassium 4.1 3.5 - 5.1 mmol/L    Comment: DELTA CHECK NOTED   Chloride 102 101 - 111 mmol/L   CO2 30 22 - 32 mmol/L   Glucose, Bld 101 (H) 65 - 99 mg/dL   BUN 8 6 - 20 mg/dL   Creatinine, Ser 1.27 (H) 0.61 - 1.24 mg/dL   Calcium 8.7 (L) 8.9 - 10.3 mg/dL   Total Protein 6.6 6.5 - 8.1 g/dL   Albumin 2.3 (L) 3.5 - 5.0 g/dL   AST 17 15 - 41 U/L   ALT 8 (L) 17 - 63 U/L   Alkaline Phosphatase 46 38 - 126 U/L   Total Bilirubin 0.4 0.3 - 1.2 mg/dL   GFR calc non Af Amer 55 (L) >60 mL/min   GFR calc Af Amer >60 >60 mL/min    Comment: (NOTE) The eGFR has been calculated using the CKD EPI equation. This calculation has not been validated in all clinical situations. eGFR's persistently <60 mL/min signify possible Chronic Kidney Disease.    Anion gap 8 5 - 15  CBC     Status: Abnormal   Collection Time: 12/14/14  3:43 AM  Result Value Ref Range   WBC 8.1 4.0 - 10.5 K/uL   RBC 3.12 (L) 4.22 - 5.81 MIL/uL  Hemoglobin 9.4 (L) 13.0 - 17.0 g/dL   HCT 28.8 (L) 39.0 - 52.0 %   MCV 92.3 78.0 - 100.0 fL   MCH 30.1 26.0 - 34.0 pg   MCHC 32.6 30.0 - 36.0 g/dL   RDW 17.9 (H) 11.5 - 15.5 %   Platelets 332 150 - 400 K/uL  POCT Activated clotting time     Status: None   Collection Time: 12/14/14  3:09 PM  Result Value Ref Range   Activated Clotting Time 220 seconds  POCT Activated clotting time     Status: None   Collection Time: 12/14/14  3:32 PM  Result Value Ref Range   Activated Clotting Time 245 seconds  POCT Activated clotting time     Status: None   Collection Time: 12/14/14  5:01 PM  Result Value Ref Range   Activated Clotting Time 177 seconds  Basic metabolic panel     Status: Abnormal   Collection Time: 12/15/14  4:44 AM  Result Value Ref Range   Sodium 138 135 - 145 mmol/L   Potassium 3.9 3.5 - 5.1 mmol/L   Chloride 103 101 - 111  mmol/L   CO2 27 22 - 32 mmol/L   Glucose, Bld 95 65 - 99 mg/dL   BUN 8 6 - 20 mg/dL   Creatinine, Ser 1.24 0.61 - 1.24 mg/dL   Calcium 8.9 8.9 - 10.3 mg/dL   GFR calc non Af Amer 56 (L) >60 mL/min   GFR calc Af Amer >60 >60 mL/min    Comment: (NOTE) The eGFR has been calculated using the CKD EPI equation. This calculation has not been validated in all clinical situations. eGFR's persistently <60 mL/min signify possible Chronic Kidney Disease.    Anion gap 8 5 - 15  CBC     Status: Abnormal   Collection Time: 12/15/14  4:44 AM  Result Value Ref Range   WBC 8.1 4.0 - 10.5 K/uL   RBC 3.10 (L) 4.22 - 5.81 MIL/uL   Hemoglobin 9.4 (L) 13.0 - 17.0 g/dL   HCT 28.3 (L) 39.0 - 52.0 %   MCV 91.3 78.0 - 100.0 fL   MCH 30.3 26.0 - 34.0 pg   MCHC 33.2 30.0 - 36.0 g/dL   RDW 17.9 (H) 11.5 - 15.5 %   Platelets 357 150 - 400 K/uL    No results found.  Review of Systems  All other systems reviewed and are negative.  Blood pressure 134/89, pulse 96, temperature 98 F (36.7 C), temperature source Oral, resp. rate 18, height '5\' 7"'  (1.702 m), weight 55.1 kg (121 lb 7.6 oz), SpO2 99 %. Physical Exam  on examination patient's left lower extremity is warm he has dry gangrene of the forefoot with no cellulitis no drainage no odor no signs of any deep infection. The hindfoot has no ischemic ulcers.  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  Plan: Patient will require a transmetatarsal amputation. Patient will be scheduled for surgery for Friday afternoon. Patient may stay inpatient or may be discharged and plan for outpatient surgery on Friday.  DUDA,MARCUS V 12/15/2014, 10:44 AM

## 2014-12-18 NOTE — Progress Notes (Signed)
Notified Dr. Ermalene Postin of patient eating half oreo cookie 1000 am per patient's sister and patient.  Patient's case cancelled per Dr. Ermalene Postin and Dr Sharol Given.

## 2014-12-21 ENCOUNTER — Non-Acute Institutional Stay (SKILLED_NURSING_FACILITY): Payer: Medicare Other | Admitting: Internal Medicine

## 2014-12-21 ENCOUNTER — Encounter: Payer: Self-pay | Admitting: Internal Medicine

## 2014-12-21 DIAGNOSIS — Z720 Tobacco use: Secondary | ICD-10-CM | POA: Diagnosis not present

## 2014-12-21 DIAGNOSIS — E876 Hypokalemia: Secondary | ICD-10-CM | POA: Diagnosis not present

## 2014-12-21 DIAGNOSIS — I96 Gangrene, not elsewhere classified: Secondary | ICD-10-CM

## 2014-12-21 DIAGNOSIS — D638 Anemia in other chronic diseases classified elsewhere: Secondary | ICD-10-CM

## 2014-12-21 DIAGNOSIS — N179 Acute kidney failure, unspecified: Secondary | ICD-10-CM | POA: Diagnosis not present

## 2014-12-21 DIAGNOSIS — I10 Essential (primary) hypertension: Secondary | ICD-10-CM | POA: Diagnosis not present

## 2014-12-21 DIAGNOSIS — F1010 Alcohol abuse, uncomplicated: Secondary | ICD-10-CM

## 2014-12-21 NOTE — Assessment & Plan Note (Signed)
SNF plan - Continue thiamine 100 mg daily and folate 1 mg daily

## 2014-12-21 NOTE — Assessment & Plan Note (Signed)
Stable ; SNF plan - cont metoprolol 12.5 mg BID

## 2014-12-21 NOTE — Assessment & Plan Note (Signed)
SNF plan - d/c Hb was 9.4, cont cyanocobalamin and folate;recheck CBc

## 2014-12-21 NOTE — Assessment & Plan Note (Signed)
Repleted; SNF plan - repeat BMP in several days

## 2014-12-21 NOTE — Progress Notes (Signed)
MRN: [id number] Name: Leon Foster  Sex: male Age: 74 y.o. DOB: 08-22-1940  [place] #: Helene Kelp Facility/Room:307 Level Of Care: SNF Provider: Inocencio Homes D Emergency Contacts: Extended Emergency Contact Information Primary Emergency Contact: Powell,Margaret Address: [address]          [place], [place] [address] [place] Phone: [phone] Relation: Sister Secondary Emergency Contact: [place] Phone: [phone] Mobile Phone: [phone] Relation: None  Code Status:   Allergies: Review of patient's allergies indicates no known allergies.  Chief Complaint  Patient presents with  . New Admit To SNF    HPI: Patient is 74 y.o. male who lives alone w/ a Hx of heavy alcohol (2 bottles of wine/day) and tobacco abuse (1ppd since age 72) who was evaluated by Vascular Surgery/Dr. Ruta Hinds on 11/26/14. During his w/u labs showed creatinine 5.27 and family was advised to bring patient to [facility] ED. According to family patient had been complaining of left foot pain for approximately a year which progressively got worse over the preceding 2 weeks. One of his sisters looked at his foot and noticed black discoloration of the second left toe. He was taken to urgent care center and then evaluated by vascular surgery who planned for an aortogram and possible intervention. Patient noted to be in renal failure. Vascular surgery was waiting for creatinine to improve for angiogram. Pt admitted to [facility] from 8/12-30 where IV hydration improved pt's renal function so an Angiogram was done. A stent was placed in the left superficial femoral artery. Plavix to continue indefinitely. Patient was seen by orthopedics regarding his ischemic foot. Patient will need a transmetatarsal amputation to be scheduled as outpt. Pt not in DT's, renal fx back to normal pt is admitted to SNF for generalized debility and to wait for his trans metatarsal  amputation. Pt will undergo OT/PT. While at SNF pt will be followed for tobacco abuse, tx with nicoderm patch, ETPOH abuse with thiamin and folate and HTN with lopressor.   Past Medical History  Diagnosis Date  . Tobacco dependence   . Alcoholic   . AVM (arteriovenous malformation) of colon     colonoscopy 2011 [facility]   . Cardiomyopathy     from recent hospitalization notes  . Acute renal failure 11/2014    from recent hospitalization notes  . Gangrene of digit     left toe  . Confusion   . PAD (peripheral artery disease)   . Leukocytosis   . Pulmonary hypertension     Past Surgical History  Procedure Laterality Date  . Peripheral vascular catheterization N/A 12/14/2014    Procedure: Abdominal Aortogram;  Surgeon: Elam Dutch, MD;  Location: [place] [facility];  Service: Cardiovascular;  Laterality: N/A;  . Lower extremity angiogram Bilateral 12/14/2014    Procedure: Lower Extremity Angiogram;  Surgeon: Elam Dutch, MD;  Location: [place] [facility];  Service: Cardiovascular;  Laterality: Bilateral;  . Peripheral vascular catheterization Left 12/14/2014    Procedure: Peripheral Vascular Intervention;  Surgeon: Elam Dutch, MD;  Location: [place] [facility];  Service: Cardiovascular;  Laterality: Left;  SFA      Medication List       This list is accurate as of: 12/21/14 10:18 PM.  Always use your most recent med list.               acetaminophen 325 MG tablet  Commonly known as:  TYLENOL  Take 650 mg by mouth every 6 (six) hours as needed for headache.     bisacodyl 10 MG suppository  Commonly known as:  DULCOLAX  Place 10 mg rectally daily as needed for mild constipation or moderate constipation (if not relieved by MOM).     clopidogrel 75 MG tablet  Commonly known as:  PLAVIX  Take 1 tablet (75 mg total) by mouth daily with breakfast.     cyanocobalamin 500 MCG tablet  Take 500 mcg by mouth daily.     feeding supplement (NEPRO CARB STEADY) Liqd   Take 237 mLs by mouth 3 (three) times daily with meals.     folic acid 1 MG tablet  Commonly known as:  FOLVITE  TAKE 1 TAB BY MOUTH ONCE DAILY     magnesium hydroxide 400 MG/5ML suspension  Commonly known as:  MILK OF MAGNESIA  Take 30 mLs by mouth daily as needed for mild constipation.     metoprolol tartrate 25 MG tablet  Commonly known as:  LOPRESSOR  Take 0.5 tablets (12.5 mg total) by mouth 2 (two) times daily.     multivitamin with minerals Tabs tablet  Take 1 tablet by mouth daily.     nicotine 21 mg/24hr patch  Commonly known as:  NICODERM CQ - dosed in mg/24 hours  Place 1 patch (21 mg total) onto the skin daily.     oxyCODONE 5 MG immediate release tablet  Commonly known as:  Oxy IR/ROXICODONE  Take 1 tablet (5 mg total) by mouth every 6 (six) hours as needed for moderate pain.     RA SALINE ENEMA 19-7 GM/118ML Enem  Place 1 enema rectally once as needed (if no relief by bisacodyl).     thiamine 100 MG tablet  Take 1 tablet (100 mg total) by mouth daily.        No orders of the defined types were placed in this encounter.    Immunization History  Administered Date(s) Administered  . Tdap 03/14/2014    Social History  Substance Use Topics  . Smoking status: Current Every Day Smoker -- 1.00 packs/day for 55 years  . Smokeless tobacco: Never Used  . Alcohol Use: 0.0 oz/week    0 Standard drinks or equivalent per week     Comment: 2-3 pints of wine daily    Family history is  +DM2, HLD,HTN  Review of Systems  DATA OBTAINED: from patient, nurse, medical record GENERAL:  no fevers, fatigue, appetite changes SKIN: No itching, rash or wounds EYES: No eye pain, redness, discharge EARS: No earache, tinnitus, change in hearing NOSE: No congestion, drainage or bleeding  MOUTH/THROAT: No mouth or tooth pain, No sore throat RESPIRATORY: No cough, wheezing, SOB CARDIAC: No chest pain, palpitations, lower extremity edema  GI: No abdominal pain, No N/V/D or  constipation, No heartburn or reflux  GU: No dysuria, frequency or urgency, or incontinence  MUSCULOSKELETAL: No unrelieved bone/joint pain NEUROLOGIC: No headache, dizziness or focal weakness PSYCHIATRIC: No c/o anxiety or sadness   Filed Vitals:   12/21/14 1852  BP: 95/65  Pulse: 66  Temp: 97.2 F (36.2 C)  Resp: 20    SpO2 Readings from Last 1 Encounters:  12/18/14 100%        Physical Exam  GENERAL APPEARANCE: Alert, min conversant, BM No acute distress.  SKIN: No diaphoresis rash HEAD: Normocephalic, atraumatic  EYES: Conjunctiva/lids clear. Pupils round, reactive. EOMs intact.  EARS: External exam WNL, canals clear. Hearing grossly normal.  NOSE:  No deformity or discharge.  MOUTH/THROAT: Lips w/o lesions  RESPIRATORY: Breathing is even, unlabored. Lung sounds are clear   CARDIOVASCULAR: Heart RRR no murmurs, rubs or gallops. No peripheral edema.   GASTROINTESTINAL: Abdomen is soft, non-tender, not distended w/ normal bowel sounds. GENITOURINARY: Bladder non tender, not distended  MUSCULOSKELETAL: L foot black toe 2nd toe and tip great ,drainage without odor from between great and second toe; no redness or heat NEUROLOGIC:  Cranial nerves 2-12 grossly intact. Moves all extremities  PSYCHIATRIC; dementia, pt looks lost, no behavioral issues  Patient Active Problem List   Diagnosis Date Noted  . HTN (hypertension) 12/21/2014  . Anemia, chronic disease 12/21/2014  . Leukocytosis   . Pulmonary hypertension   . Blood poisoning   . Disorientation   . Gangrene of foot   . Acute renal failure syndrome   . Cardiomyopathy   . Alcohol abuse   . Delirium   . Acute renal failure 11/27/2014  . Gangrene of toe-left second toe 11/27/2014  . Hypokalemia 11/27/2014  . Dehydration 11/27/2014  . Tobacco abuse 11/27/2014  . Alcohol dependence 11/27/2014  . Confusion 11/27/2014  . PAD (peripheral artery disease) 11/27/2014  . Episode of shaking 11/26/2014  . Pain of toe of  left foot-Great, and Left 2nd Toe- Right Leg/Calf 11/26/2014  . POLYP, COLON, RECURRENT 07/06/2009    CBC    Component Value Date/Time   WBC 5.8 12/18/2014 1239   WBC 11.7* 11/26/2014 1311   RBC 3.18* 12/18/2014 1239   RBC 3.61* 11/26/2014 1311   HGB 9.8* 12/18/2014 1239   HGB 10.9* 11/26/2014 1311   HCT 29.9* 12/18/2014 1239   HCT 34.7* 11/26/2014 1311   PLT 405* 12/18/2014 1239   MCV 94.0 12/18/2014 1239   MCV 96.1 11/26/2014 1311   LYMPHSABS 2.0 12/18/2014 1239   MONOABS 0.9 12/18/2014 1239   EOSABS 0.5 12/18/2014 1239   BASOSABS 0.1 12/18/2014 1239    CMP     Component Value Date/Time   NA 139 12/18/2014 1239   K 4.2 12/18/2014 1239   CL 106 12/18/2014 1239   CO2 27 12/18/2014 1239   GLUCOSE 100* 12/18/2014 1239   BUN 8 12/18/2014 1239   CREATININE 1.34* 12/18/2014 1239   CREATININE 5.27* 11/26/2014 1308   CALCIUM 8.9 12/18/2014 1239   PROT 7.2 12/18/2014 1239   ALBUMIN 2.6* 12/18/2014 1239   AST 18 12/18/2014 1239   ALT 10* 12/18/2014 1239   ALKPHOS 51 12/18/2014 1239   BILITOT 0.2* 12/18/2014 1239   GFRNONAA 51* 12/18/2014 1239   GFRNONAA 10* 11/26/2014 1308   GFRAA 59* 12/18/2014 1239   GFRAA 12* 11/26/2014 1308    Lab Results  Component Value Date   HGBA1C 5.0 11/27/2014     No results found.  Not all labs, radiology exams or other studies done during hospitalization come through on my EPIC note; however they are reviewed by me.    Assessment and Plan  Gangrene of toe-left second toe Vascular surgery was consulted. Due to renal failure there was a delay in obtaining an angiogram. After renal function improved Angiogram was done. A stent was placed in the left superficial femoral artery. Plavix to continue indefinitely. Patient was seen by orthopedics regarding his ischemic foot. Patient will need a transmetatarsal amputation. This will be scheduled for Friday. There are no signs of infection currently SNF plan - dressing by wound care for wound  which is now draining  Acute renal failure Patient was seen by nephrology.  He was given IV fluids. Creatinine has become normal. His acidosis is also corrected SNF plan - d/c Cr 1.34, repeat BMP in several days   Hypokalemia Repleted; SNF plan - repeat BMP in several days  Tobacco abuse SNF plan - cont nicoderm patch;encourage no smoking  Alcohol abuse SNF plan - Continue thiamine 100 mg daily and folate 1 mg daily  HTN (hypertension) Stable ; SNF plan - cont metoprolol 12.5 mg BID  Anemia, chronic disease SNF plan - d/c Hb was 9.4, cont cyanocobalamin and folate;recheck CBc   Pt seen 12/17/2014 Hennie Duos, MD

## 2014-12-21 NOTE — Assessment & Plan Note (Addendum)
Patient was seen by nephrology. He was given IV fluids. Creatinine has become normal. His acidosis is also corrected SNF plan - d/c Cr 1.34, repeat BMP in several days

## 2014-12-21 NOTE — Assessment & Plan Note (Signed)
Vascular surgery was consulted. Due to renal failure there was a delay in obtaining an angiogram. After renal function improved Angiogram was done. A stent was placed in the left superficial femoral artery. Plavix to continue indefinitely. Patient was seen by orthopedics regarding his ischemic foot. Patient will need a transmetatarsal amputation. This will be scheduled for Friday. There are no signs of infection currently SNF plan - dressing by wound care for wound which is now draining

## 2014-12-21 NOTE — Assessment & Plan Note (Signed)
SNF plan - cont nicoderm patch;encourage no smoking

## 2014-12-22 LAB — BASIC METABOLIC PANEL
BUN: 13 mg/dL (ref 4–21)
BUN: 13 mg/dL (ref 4–21)
CREATININE: 1.2 mg/dL (ref 0.6–1.3)
Creatinine: 1.2 mg/dL (ref 0.6–1.3)
GLUCOSE: 84 mg/dL
Glucose: 84 mg/dL
POTASSIUM: 3.6 mmol/L (ref 3.4–5.3)
Potassium: 3.6 mmol/L (ref 3.4–5.3)
SODIUM: 138 mmol/L (ref 137–147)
Sodium: 138 mmol/L (ref 137–147)

## 2014-12-22 LAB — HEPATIC FUNCTION PANEL
ALK PHOS: 52 U/L (ref 25–125)
ALT: 8 U/L — AB (ref 10–40)
ALT: 8 U/L — AB (ref 10–40)
AST: 11 U/L — AB (ref 14–40)
AST: 11 U/L — AB (ref 14–40)
Alkaline Phosphatase: 52 U/L (ref 25–125)
BILIRUBIN, TOTAL: 0.2 mg/dL
Bilirubin, Total: 0.2 mg/dL

## 2014-12-22 LAB — CBC AND DIFFERENTIAL
HCT: 28 % — AB (ref 41–53)
HCT: 28 % — AB (ref 41–53)
Hemoglobin: 9.1 g/dL — AB (ref 13.5–17.5)
Hemoglobin: 9.1 g/dL — AB (ref 13.5–17.5)
PLATELETS: 380 10*3/uL (ref 150–399)
Platelets: 380 10*3/uL (ref 150–399)
WBC: 9.3 10*3/mL
WBC: 9.3 10^3/mL

## 2014-12-24 ENCOUNTER — Other Ambulatory Visit (HOSPITAL_COMMUNITY): Payer: Self-pay | Admitting: Orthopedic Surgery

## 2014-12-28 ENCOUNTER — Emergency Department (HOSPITAL_COMMUNITY)
Admission: EM | Admit: 2014-12-28 | Discharge: 2014-12-28 | Disposition: A | Discharge status: Home / Self Care | Payer: Medicare Other | Attending: Emergency Medicine | Admitting: Emergency Medicine

## 2014-12-28 ENCOUNTER — Other Ambulatory Visit: Payer: Self-pay

## 2014-12-28 ENCOUNTER — Emergency Department (HOSPITAL_COMMUNITY): Payer: Medicare Other

## 2014-12-28 ENCOUNTER — Encounter (HOSPITAL_COMMUNITY): Payer: Self-pay

## 2014-12-28 DIAGNOSIS — Z79899 Other long term (current) drug therapy: Secondary | ICD-10-CM | POA: Diagnosis not present

## 2014-12-28 DIAGNOSIS — I10 Essential (primary) hypertension: Secondary | ICD-10-CM | POA: Diagnosis not present

## 2014-12-28 DIAGNOSIS — F0390 Unspecified dementia without behavioral disturbance: Secondary | ICD-10-CM | POA: Diagnosis not present

## 2014-12-28 DIAGNOSIS — Z720 Tobacco use: Secondary | ICD-10-CM | POA: Insufficient documentation

## 2014-12-28 DIAGNOSIS — Z7902 Long term (current) use of antithrombotics/antiplatelets: Secondary | ICD-10-CM | POA: Insufficient documentation

## 2014-12-28 DIAGNOSIS — R569 Unspecified convulsions: Secondary | ICD-10-CM | POA: Insufficient documentation

## 2014-12-28 LAB — URINALYSIS, ROUTINE W REFLEX MICROSCOPIC
BILIRUBIN URINE: NEGATIVE
GLUCOSE, UA: NEGATIVE mg/dL
HGB URINE DIPSTICK: NEGATIVE
KETONES UR: NEGATIVE mg/dL
LEUKOCYTES UA: NEGATIVE
Nitrite: NEGATIVE
PH: 6 (ref 5.0–8.0)
Protein, ur: NEGATIVE mg/dL
Specific Gravity, Urine: 1.012 (ref 1.005–1.030)
Urobilinogen, UA: 0.2 mg/dL (ref 0.0–1.0)

## 2014-12-28 LAB — COMPREHENSIVE METABOLIC PANEL
ALBUMIN: 2.6 g/dL — AB (ref 3.5–5.0)
ALT: 6 U/L — AB (ref 17–63)
AST: 13 U/L — AB (ref 15–41)
Alkaline Phosphatase: 60 U/L (ref 38–126)
Anion gap: 7 (ref 5–15)
BILIRUBIN TOTAL: 0.5 mg/dL (ref 0.3–1.2)
BUN: 7 mg/dL (ref 6–20)
CHLORIDE: 104 mmol/L (ref 101–111)
CO2: 29 mmol/L (ref 22–32)
CREATININE: 1.04 mg/dL (ref 0.61–1.24)
Calcium: 9.2 mg/dL (ref 8.9–10.3)
GFR calc Af Amer: >60 mL/min (ref >60)
GFR calc non Af Amer: >60 mL/min (ref >60)
GLUCOSE: 90 mg/dL (ref 65–99)
POTASSIUM: 4 mmol/L (ref 3.5–5.1)
Sodium: 140 mmol/L (ref 135–145)
Total Protein: 7 g/dL (ref 6.5–8.1)

## 2014-12-28 LAB — CBC WITH DIFFERENTIAL/PLATELET
Basophils Absolute: 0.1 10*3/uL (ref 0.0–0.1)
Basophils Relative: 1 % (ref 0–1)
EOS ABS: 0.5 10*3/uL (ref 0.0–0.7)
EOS PCT: 4 % (ref 0–5)
HCT: 28.6 % — ABNORMAL LOW (ref 39.0–52.0)
Hemoglobin: 9.5 g/dL — ABNORMAL LOW (ref 13.0–17.0)
LYMPHS ABS: 2.3 10*3/uL (ref 0.7–4.0)
LYMPHS PCT: 21 % (ref 12–46)
MCH: 30.4 pg (ref 26.0–34.0)
MCHC: 33.2 g/dL (ref 30.0–36.0)
MCV: 91.7 fL (ref 78.0–100.0)
MONO ABS: 0.7 10*3/uL (ref 0.1–1.0)
MONOS PCT: 7 % (ref 3–12)
Neutro Abs: 7.1 10*3/uL (ref 1.7–7.7)
Neutrophils Relative %: 67 % (ref 43–77)
PLATELETS: 388 10*3/uL (ref 150–400)
RBC: 3.12 MIL/uL — ABNORMAL LOW (ref 4.22–5.81)
RDW: 17.2 % — AB (ref 11.5–15.5)
WBC: 10.7 10*3/uL — ABNORMAL HIGH (ref 4.0–10.5)

## 2014-12-28 LAB — RAPID URINE DRUG SCREEN, HOSP PERFORMED
Amphetamines: NOT DETECTED
BARBITURATES: NOT DETECTED
Benzodiazepines: NOT DETECTED
COCAINE: NOT DETECTED
Opiates: NOT DETECTED
TETRAHYDROCANNABINOL: NOT DETECTED

## 2014-12-28 LAB — ETHANOL

## 2014-12-28 LAB — CBG MONITORING, ED: Glucose-Capillary: 63 mg/dL — ABNORMAL LOW (ref 65–99)

## 2014-12-28 NOTE — ED Notes (Signed)
Per EMS, Patient is coming from [place]. Patient has been in physical therapy and reports having frequent absent seizures for several weeks during physical therapy only. Patient is alert and oriented per baseline upon arrival with no complaints. Patient has a Hx of Dementia and absent seizures. Physical therapist reports patient having two-three absent seizures during physical therapy this morning. Vitals per EMS: 110/62, 72 HR, 16 RR, 99%, Denies pain, CBG 135. Patient's EKG showed [place] with occasional PVCs.

## 2014-12-28 NOTE — ED Provider Notes (Signed)
CSN: [id number]     Arrival date & time 12/28/14  1111 History   First MD Initiated Contact with Patient 12/28/14 1114     Chief Complaint  Patient presents with  . Seizures     (Consider location/radiation/quality/duration/timing/severity/associated sxs/prior Treatment) HPI Comments: Pt is a 74 yo male with PMH of alcoholism and dementia who presents to the ED via EMS from [facility] with complaint of absent seizure, onset PTA. EMS reports the pt has been going to PT for the past few weeks and the therapist reported pt having frequent absent seizures. Therapist reports pt having 2-3 absent seizures during PT this morning. EMS report pt is as baseline on arrival with no complaints. Pt denies fever, chills, myalgias, headache, visual changes, SOB, CP, abdominal pain, N/V/D, numbness, tingling, weakness. Pt's sister reports that pt does not have a history of seizures but has had DTs from alcohol detox. She notes the pt is scheduled to have vascular surgery this Friday for his left gangrene foot.   Patient is a 74 y.o. male presenting with seizures.  Seizures   Past Medical History  Diagnosis Date  . Tobacco dependence   . Alcoholic   . AVM (arteriovenous malformation) of colon     colonoscopy 2011 [facility]   . Cardiomyopathy     from recent hospitalization notes  . Acute renal failure 11/2014    from recent hospitalization notes  . Gangrene of digit     left toe  . Confusion   . PAD (peripheral artery disease)   . Leukocytosis   . Pulmonary hypertension    Past Surgical History  Procedure Laterality Date  . Peripheral vascular catheterization N/A 12/14/2014    Procedure: Abdominal Aortogram;  Surgeon: Elam Dutch, MD;  Location: [place] [facility];  Service: Cardiovascular;  Laterality: N/A;  . Lower extremity angiogram Bilateral 12/14/2014    Procedure: Lower Extremity Angiogram;  Surgeon: Elam Dutch, MD;  Location: [place] [facility];  Service: Cardiovascular;   Laterality: Bilateral;  . Peripheral vascular catheterization Left 12/14/2014    Procedure: Peripheral Vascular Intervention;  Surgeon: Elam Dutch, MD;  Location: [place] [facility];  Service: Cardiovascular;  Laterality: Left;  SFA   Family History  Problem Relation Age of Onset  . Stroke Sister   . Deep vein thrombosis Sister   . Cancer Brother   . Diabetes Brother   . Hyperlipidemia Brother   . Hypertension Brother   . Heart disease Father   . Hypertension Sister    Social History  Substance Use Topics  . Smoking status: Current Every Day Smoker -- 1.00 packs/day for 55 years  . Smokeless tobacco: Never Used  . Alcohol Use: 0.0 oz/week    0 Standard drinks or equivalent per week     Comment: 2-3 pints of wine daily    Review of Systems  Neurological: Positive for seizures.  All other systems reviewed and are negative.     Allergies  Review of patient's allergies indicates no known allergies.  Home Medications   Prior to Admission medications   Medication Sig Start Date End Date Taking? Authorizing Provider  acetaminophen (TYLENOL) 325 MG tablet Take 650 mg by mouth every 6 (six) hours as needed for headache.    Historical Provider, MD  bisacodyl (DULCOLAX) 10 MG suppository Place 10 mg rectally daily as needed for mild constipation or moderate constipation (if not relieved by MOM).    Historical Provider, MD  clopidogrel (PLAVIX) 75 MG  tablet Take 1 tablet (75 mg total) by mouth daily with breakfast. 12/15/14   Bonnielee Haff, MD  cyanocobalamin 500 MCG tablet Take 500 mcg by mouth daily.    Historical Provider, MD  folic acid (FOLVITE) 1 MG tablet TAKE 1 TAB BY MOUTH ONCE DAILY 11/09/14   Historical Provider, MD  magnesium hydroxide (MILK OF MAGNESIA) 400 MG/5ML suspension Take 30 mLs by mouth daily as needed for mild constipation.    Historical Provider, MD  metoprolol tartrate (LOPRESSOR) 25 MG tablet Take 0.5 tablets (12.5 mg total) by mouth 2 (two) times daily.  12/15/14   Bonnielee Haff, MD  Multiple Vitamin (MULTIVITAMIN WITH MINERALS) TABS tablet Take 1 tablet by mouth daily. 12/15/14   Bonnielee Haff, MD  nicotine (NICODERM CQ - DOSED IN MG/24 HOURS) 21 mg/24hr patch Place 1 patch (21 mg total) onto the skin daily. 12/15/14   Bonnielee Haff, MD  Nutritional Supplements (FEEDING SUPPLEMENT, NEPRO CARB STEADY,) LIQD Take 237 mLs by mouth 3 (three) times daily with meals. 12/15/14   Bonnielee Haff, MD  oxyCODONE (OXY IR/ROXICODONE) 5 MG immediate release tablet Take 1 tablet (5 mg total) by mouth every 6 (six) hours as needed for moderate pain. 12/15/14   Bonnielee Haff, MD  Sodium Phosphates (RA SALINE ENEMA) 19-7 GM/118ML ENEM Place 1 enema rectally once as needed (if no relief by bisacodyl).    Historical Provider, MD  thiamine 100 MG tablet Take 1 tablet (100 mg total) by mouth daily. 12/15/14   Bonnielee Haff, MD   BP 111/64 mmHg  Pulse 70  Temp(Src) 98.1 F (36.7 C) (Oral)  Resp 15  SpO2 100% Physical Exam  Constitutional: He is oriented to person, place, and time. He appears well-developed and well-nourished.  Thin, frail appearing male who appears to be in NAD.  HENT:  Head: Normocephalic and atraumatic.  Mouth/Throat: Uvula is midline, oropharynx is clear and moist and mucous membranes are normal. No oropharyngeal exudate.  Eyes: Conjunctivae and EOM are normal. Pupils are equal, round, and reactive to light. Right eye exhibits no discharge. Left eye exhibits no discharge. No scleral icterus.  Neck: Normal range of motion. Neck supple.  Cardiovascular: Normal rate, regular rhythm, normal heart sounds and intact distal pulses.   Pulmonary/Chest: Effort normal and breath sounds normal. He has no wheezes. He has no rales. He exhibits no tenderness.  Abdominal: Soft. Bowel sounds are normal. He exhibits no mass. There is no tenderness. There is no rebound and no guarding.  Musculoskeletal: Normal range of motion. He exhibits no edema or tenderness.   Left foot bandaged.  Lymphadenopathy:    He has no cervical adenopathy.  Neurological: He is alert and oriented to person, place, and time. He has normal strength and normal reflexes. No cranial nerve deficit or sensory deficit. He displays a negative Romberg sign. Coordination normal.  Pt orientated to person, place and time. Normal finger-nose-finger.   Skin: Skin is warm and dry.  Nursing note and vitals reviewed.   ED Course  Procedures (including critical care time) Labs Review Labs Reviewed  COMPREHENSIVE METABOLIC PANEL  CBC WITH DIFFERENTIAL/PLATELET  URINALYSIS, ROUTINE W REFLEX MICROSCOPIC (NOT AT [facility])  URINE RAPID DRUG SCREEN, HOSP PERFORMED  ETHANOL  CBG MONITORING, ED    Imaging Review No results found. I have personally reviewed and evaluated these images and lab results as part of my medical decision-making.   EKG Interpretation None      Filed Vitals:   12/28/14 1414  BP: 127/74  Pulse: 72  Temp:   Resp: 17    MDM   Final diagnoses:  None    Pt presents with reported absent seizures seen during therapy at PT. EMS reported pt was at baseline on arrival to ED. Pt does not have any complaints in ED. Pt's sister reports the pt having a history of episodes where he stares off and is not responsive for 1-2 min (similar to what was described to EMS by therapist). VSS. No neuro deficits, A&Ox3. UA, UDS, CMP, Ethanol unremarkable. CBC consistent with chronic levels. CT head with no acute intracranial abnormalities. Pt's mental status has remained stable during ED visit and the reported "absent seizures" appear to be chronic. Plan to d/c pt home. Pt and family given contact info to follow up with neurology this week. Pt advised to contact his surgeon/anesthesia team to acknowledge them about his ED visit today since he has a scheduled surgery Friday for gangrenous toe.   Evaluation does not show pathology requring ongoing emergent intervention or admission. Pt is  hemodynamically stable and mentating appropriately. Discussed findings/results and plan with patient/guardian, who agrees with plan. All questions answered. Return precautions discussed and outpatient follow up given.      Chesley Noon [place], [place] 12/28/14 1447  Orpah Greek, MD 12/28/14 1447

## 2014-12-28 NOTE — ED Notes (Signed)
MD Pollina at the bedside verifying patient's neuro check. Patient cleared to be discharged by MD.

## 2014-12-28 NOTE — ED Notes (Signed)
MD POllina at the bedside.

## 2014-12-28 NOTE — ED Provider Notes (Addendum)
Patient presented to the ER with episodes of staring. Patient is currently at [facility] getting physical therapy. He is there because of a gangrenous toe, pending vascular surgery. Physical therapy has noted several episodes of him staring without response. Family, however, report that he has had these episodes in the past.  Face to face Exam: HEENT - PERRLA Lungs - CTAB Heart - RRR, no M/R/G Abd - S/NT/ND Neuro - alert, oriented x3  Plan: Monitoring, workup including CT scan head, negative. Patient's sister reports that he staring episodes have been chronic for him and therefore they are unlikely to be a change or a seizure. Can have outpatient workup.  Orpah Greek, MD 12/28/14 [place], MD 12/28/14 1435

## 2014-12-28 NOTE — ED Notes (Signed)
Patient returned from CT

## 2014-12-28 NOTE — ED Notes (Signed)
Paperwork sent back with patient's family.

## 2014-12-28 NOTE — ED Notes (Signed)
CBG 63

## 2014-12-28 NOTE — ED Notes (Signed)
PA at the bedside examing patient to be cleared.

## 2014-12-28 NOTE — Discharge Instructions (Signed)
Please call the office of either [facility] or [facility] for follow up this week. Please contact your surgeon today and advise them about your visit today. Please return to the Emergency Department if symptoms worsen or new onset of change in mental status, weakness, seizures, syncope.

## 2014-12-28 NOTE — ED Notes (Signed)
Patient dressed and ready to go. Waiting for Pollina to exam before patient can be discharged.

## 2014-12-28 NOTE — ED Notes (Signed)
PA at the bedside.

## 2014-12-31 ENCOUNTER — Encounter (HOSPITAL_COMMUNITY): Payer: Self-pay | Admitting: *Deleted

## 2014-12-31 NOTE — Progress Notes (Signed)
Suggested to nurse, Verline Lema at [facility] that pt have a late snack tonight so he won't be tempted to eat prior to surgery in AM which he did the last time.  Pt was seen in the ED on 12/28/14 for "seizure" activity. Dr. Lissa Hoard made aware of this.

## 2015-01-01 ENCOUNTER — Ambulatory Visit (HOSPITAL_COMMUNITY): Payer: Medicare Other | Admitting: Anesthesiology

## 2015-01-01 ENCOUNTER — Encounter (HOSPITAL_COMMUNITY): Payer: Self-pay | Admitting: *Deleted

## 2015-01-01 ENCOUNTER — Ambulatory Visit (HOSPITAL_COMMUNITY)
Admission: RE | Admit: 2015-01-01 | Discharge: 2015-01-01 | Disposition: A | Discharge status: Skilled Nursing Facility | Payer: Medicare Other | Source: Ambulatory Visit | Attending: Orthopedic Surgery | Admitting: Orthopedic Surgery

## 2015-01-01 ENCOUNTER — Encounter (HOSPITAL_COMMUNITY)
Admission: RE | Disposition: A | Discharge status: Skilled Nursing Facility | Payer: Self-pay | Source: Ambulatory Visit | Attending: Orthopedic Surgery

## 2015-01-01 DIAGNOSIS — M868X7 Other osteomyelitis, ankle and foot: Secondary | ICD-10-CM | POA: Insufficient documentation

## 2015-01-01 DIAGNOSIS — I10 Essential (primary) hypertension: Secondary | ICD-10-CM | POA: Insufficient documentation

## 2015-01-01 DIAGNOSIS — F17210 Nicotine dependence, cigarettes, uncomplicated: Secondary | ICD-10-CM | POA: Insufficient documentation

## 2015-01-01 DIAGNOSIS — L97529 Non-pressure chronic ulcer of other part of left foot with unspecified severity: Secondary | ICD-10-CM | POA: Diagnosis not present

## 2015-01-01 DIAGNOSIS — I70262 Atherosclerosis of native arteries of extremities with gangrene, left leg: Secondary | ICD-10-CM | POA: Insufficient documentation

## 2015-01-01 DIAGNOSIS — I96 Gangrene, not elsewhere classified: Secondary | ICD-10-CM

## 2015-01-01 HISTORY — PX: AMPUTATION: SHX166

## 2015-01-01 HISTORY — DX: Unspecified convulsions: R56.9

## 2015-01-01 LAB — APTT: aPTT: 31 seconds (ref 24–37)

## 2015-01-01 SURGERY — AMPUTATION, FOOT, RAY
Anesthesia: Monitor Anesthesia Care | Site: Foot | Laterality: Left

## 2015-01-01 MED ORDER — CHLORHEXIDINE GLUCONATE 4 % EX LIQD: 60.0000 mL | Freq: Once | CUTANEOUS | Status: DC

## 2015-01-01 MED ORDER — MIDAZOLAM HCL 2 MG/2ML IJ SOLN
INTRAMUSCULAR | Status: AC
  Filled 2015-01-01: qty 4

## 2015-01-01 MED ORDER — HYDROMORPHONE HCL 1 MG/ML IJ SOLN
INTRAMUSCULAR | Status: AC
  Administered 2015-01-01: 0.25 mg via INTRAVENOUS
  Filled 2015-01-01: qty 1

## 2015-01-01 MED ORDER — LACTATED RINGERS IV SOLN
INTRAVENOUS | Status: DC
  Administered 2015-01-01: 11:00:00 via INTRAVENOUS

## 2015-01-01 MED ORDER — HYDROMORPHONE HCL 1 MG/ML IJ SOLN
0.2500 mg | INTRAMUSCULAR | Status: DC | PRN
  Administered 2015-01-01 (×2): 0.25 mg via INTRAVENOUS

## 2015-01-01 MED ORDER — PROMETHAZINE HCL 25 MG/ML IJ SOLN: 6.2500 mg | INTRAMUSCULAR | Status: DC | PRN

## 2015-01-01 MED ORDER — FENTANYL CITRATE (PF) 100 MCG/2ML IJ SOLN
100.0000 ug | Freq: Once | INTRAMUSCULAR | Status: AC
  Administered 2015-01-01: 100 ug via INTRAVENOUS
  Filled 2015-01-01 (×2): qty 2

## 2015-01-01 MED ORDER — LIDOCAINE HCL (CARDIAC) 20 MG/ML IV SOLN
INTRAVENOUS | Status: DC | PRN
  Administered 2015-01-01: 40 mg via INTRAVENOUS

## 2015-01-01 MED ORDER — 0.9 % SODIUM CHLORIDE (POUR BTL) OPTIME
TOPICAL | Status: DC | PRN
  Administered 2015-01-01: 1000 mL

## 2015-01-01 MED ORDER — MIDAZOLAM HCL 2 MG/2ML IJ SOLN
2.0000 mg | Freq: Once | INTRAMUSCULAR | Status: DC
  Filled 2015-01-01 (×2): qty 2

## 2015-01-01 MED ORDER — PROPOFOL INFUSION 10 MG/ML OPTIME
INTRAVENOUS | Status: DC | PRN
  Administered 2015-01-01: 25 ug/kg/min via INTRAVENOUS

## 2015-01-01 MED ORDER — OXYCODONE HCL 5 MG PO TABS
ORAL_TABLET | ORAL | Status: AC
  Administered 2015-01-01: 10 mg via ORAL
  Filled 2015-01-01: qty 2

## 2015-01-01 MED ORDER — FENTANYL CITRATE (PF) 250 MCG/5ML IJ SOLN
INTRAMUSCULAR | Status: AC
  Filled 2015-01-01: qty 5

## 2015-01-01 MED ORDER — PROPOFOL 10 MG/ML IV BOLUS
INTRAVENOUS | Status: DC | PRN
  Administered 2015-01-01: 20 mg via INTRAVENOUS

## 2015-01-01 MED ORDER — CEFAZOLIN SODIUM-DEXTROSE 2-3 GM-% IV SOLR
2.0000 g | INTRAVENOUS | Status: AC
  Administered 2015-01-01: 2 g via INTRAVENOUS
  Filled 2015-01-01: qty 50

## 2015-01-01 MED ORDER — OXYCODONE HCL 5 MG PO TABS
10.0000 mg | ORAL_TABLET | Freq: Once | ORAL | Status: AC
  Administered 2015-01-01: 10 mg via ORAL

## 2015-01-01 MED ORDER — FENTANYL CITRATE (PF) 100 MCG/2ML IJ SOLN
INTRAMUSCULAR | Status: DC | PRN
  Administered 2015-01-01: 100 ug via INTRAVENOUS

## 2015-01-01 MED ORDER — MEPERIDINE HCL 25 MG/ML IJ SOLN: 6.2500 mg | INTRAMUSCULAR | Status: DC | PRN

## 2015-01-01 SURGICAL SUPPLY — 30 items
BLADE SAW SGTL MED 73X18.5 STR (BLADE) IMPLANT
BNDG COHESIVE 4X5 TAN STRL (GAUZE/BANDAGES/DRESSINGS) ×3 IMPLANT
BNDG GAUZE ELAST 4 BULKY (GAUZE/BANDAGES/DRESSINGS) ×3 IMPLANT
COVER SURGICAL LIGHT HANDLE (MISCELLANEOUS) ×6 IMPLANT
DRAPE U-SHAPE 47X51 STRL (DRAPES) ×6 IMPLANT
DRSG ADAPTIC 3X8 NADH LF (GAUZE/BANDAGES/DRESSINGS) ×3 IMPLANT
DRSG PAD ABDOMINAL 8X10 ST (GAUZE/BANDAGES/DRESSINGS) ×6 IMPLANT
DURAPREP 26ML APPLICATOR (WOUND CARE) ×3 IMPLANT
ELECT REM PT RETURN 9FT ADLT (ELECTROSURGICAL) ×3
ELECTRODE REM PT RTRN 9FT ADLT (ELECTROSURGICAL) ×1 IMPLANT
GAUZE SPONGE 4X4 12PLY STRL (GAUZE/BANDAGES/DRESSINGS) ×3 IMPLANT
GLOVE BIOGEL PI IND STRL 9 (GLOVE) ×1 IMPLANT
GLOVE BIOGEL PI INDICATOR 9 (GLOVE) ×2
GLOVE SURG ORTHO 9.0 STRL STRW (GLOVE) ×3 IMPLANT
GOWN STRL REUS W/ TWL LRG LVL3 (GOWN DISPOSABLE) ×1 IMPLANT
GOWN STRL REUS W/ TWL XL LVL3 (GOWN DISPOSABLE) ×2 IMPLANT
GOWN STRL REUS W/TWL LRG LVL3 (GOWN DISPOSABLE) ×3
GOWN STRL REUS W/TWL XL LVL3 (GOWN DISPOSABLE) ×6
KIT BASIN OR (CUSTOM PROCEDURE TRAY) ×3 IMPLANT
KIT ROOM TURNOVER OR (KITS) ×3 IMPLANT
NS IRRIG 1000ML POUR BTL (IV SOLUTION) ×3 IMPLANT
PACK ORTHO EXTREMITY (CUSTOM PROCEDURE TRAY) ×3 IMPLANT
PAD ARMBOARD 7.5X6 YLW CONV (MISCELLANEOUS) ×6 IMPLANT
SPONGE LAP 18X18 X RAY DECT (DISPOSABLE) ×3 IMPLANT
STOCKINETTE IMPERVIOUS LG (DRAPES) IMPLANT
SUT ETHILON 2 0 PSLX (SUTURE) ×6 IMPLANT
TOWEL OR 17X24 6PK STRL BLUE (TOWEL DISPOSABLE) ×3 IMPLANT
TOWEL OR 17X26 10 PK STRL BLUE (TOWEL DISPOSABLE) ×3 IMPLANT
UNDERPAD 30X30 INCONTINENT (UNDERPADS AND DIAPERS) ×3 IMPLANT
WATER STERILE IRR 1000ML POUR (IV SOLUTION) ×3 IMPLANT

## 2015-01-01 NOTE — Anesthesia Preprocedure Evaluation (Addendum)
Anesthesia Evaluation  Patient identified by MRN, date of birth, ID band Patient awake    Reviewed: Allergy & Precautions, NPO status , Patient's Chart, lab work & pertinent test results, reviewed documented beta blocker date and time   Airway Mallampati: II  TM Distance: >3 FB Neck ROM: Full    Dental no notable dental hx.    Pulmonary neg pulmonary ROS, Current Smoker,    Pulmonary exam normal breath sounds clear to auscultation       Cardiovascular hypertension, Pt. on medications and Pt. on home beta blockers + Peripheral Vascular Disease  Normal cardiovascular exam Rhythm:Regular Rate:Normal     Neuro/Psych PSYCHIATRIC DISORDERS Some question of absence seizures. No EEG proven seizure activity, just staring spells.    GI/Hepatic negative GI ROS, Neg liver ROS,   Endo/Other  negative endocrine ROS  Renal/GU Renal disease     Musculoskeletal negative musculoskeletal ROS (+)   Abdominal   Peds  Hematology  (+) anemia ,   Anesthesia Other Findings   Reproductive/Obstetrics negative OB ROS                           Anesthesia Physical Anesthesia Plan  ASA: III  Anesthesia Plan: MAC and Regional   Post-op Pain Management:    Induction: Intravenous  Airway Management Planned:   Additional Equipment:   Intra-op Plan:   Post-operative Plan:   Informed Consent: I have reviewed the patients History and Physical, chart, labs and discussed the procedure including the risks, benefits and alternatives for the proposed anesthesia with the patient or authorized representative who has indicated his/her understanding and acceptance.   Dental advisory given  Plan Discussed with: CRNA  Anesthesia Plan Comments:        Anesthesia Quick Evaluation

## 2015-01-01 NOTE — H&P (Signed)
Leon Foster is an 75 y.o. male.   Chief Complaint: Osteomyelitis ulceration left forefoot HPI: Patient is a 74 year old gentleman who is status post foot salvage intervention who has failed conservative care and presents at this time for a left transmetatarsal amputation.  Past Medical History  Diagnosis Date  . Tobacco dependence   . Alcoholic   . AVM (arteriovenous malformation) of colon     colonoscopy 2011 [facility]   . Cardiomyopathy     from recent hospitalization notes  . Acute renal failure 11/2014    from recent hospitalization notes  . Gangrene of digit     left toe  . Confusion   . PAD (peripheral artery disease)   . Leukocytosis   . Pulmonary hypertension   . Seizures      1 time seizure on 12/28/14 - seen in ED    Past Surgical History  Procedure Laterality Date  . Peripheral vascular catheterization N/A 12/14/2014    Procedure: Abdominal Aortogram;  Surgeon: Leon Dutch, MD;  Location: [facility];  Service: Cardiovascular;  Laterality: N/A;  . Lower extremity angiogram Bilateral 12/14/2014    Procedure: Lower Extremity Angiogram;  Surgeon: Leon Dutch, MD;  Location: [facility];  Service: Cardiovascular;  Laterality: Bilateral;  . Peripheral vascular catheterization Left 12/14/2014    Procedure: Peripheral Vascular Intervention;  Surgeon: Leon Dutch, MD;  Location: [facility];  Service: Cardiovascular;  Laterality: Left;  SFA    Family History  Problem Relation Age of Onset  . Stroke Sister   . Deep vein thrombosis Sister   . Cancer Brother   . Diabetes Brother   . Hyperlipidemia Brother   . Hypertension Brother   . Heart disease Father   . Hypertension Sister    Social History:  reports that he has been smoking.  He has never used smokeless tobacco. He reports that he drinks alcohol. He reports that he does not use illicit drugs.  Allergies: No Known Allergies  No prescriptions prior to admission    No results  found for this or any previous visit (from the past 48 hour(s)). No results found.  Review of Systems  All other systems reviewed and are negative.   There were no vitals taken for this visit. Physical Exam  On examination patient has ulceration osteomyelitis of the left forefoot. Assessment/Plan Assessment: Left forefoot ostium myelitis and ulceration.  Plan: We'll plan for a left transmetatarsal amputation. Risk and benefits were discussed including risk of the wound not healing. Patient states he understands and wishes to proceed at this time.  Leon Foster V 01/01/2015, 7:14 AM

## 2015-01-01 NOTE — Discharge Instructions (Signed)
Keep dressing clean and dry until follow up in 2 weeks.  May reinforce dressing as needed with gauze and/or ace wrap.

## 2015-01-01 NOTE — Progress Notes (Signed)
Orthopedic Tech Progress Note Patient Details:  Leon Foster [birth year] [id number]  Ortho Devices Type of Ortho Device: Postop shoe/boot Ortho Device/Splint Interventions: Application   Cammer, Theodoro Parma 01/01/2015, 2:45 PM

## 2015-01-01 NOTE — Anesthesia Postprocedure Evaluation (Signed)
Anesthesia Post Note  Patient: Leon Foster  Procedure(s) Performed: Procedure(s) (LRB): Left Transmetatarsal Amputation (Left)  Anesthesia type: MAC + block  Patient location: PACU  Post pain: Pain level controlled  Post assessment: Post-op Vital signs reviewed  Last Vitals: BP 133/79 mmHg  Pulse 64  Temp(Src) 36.7 C (Oral)  Resp 18  Ht 5\' 7"  (1.702 m)  Wt 121 lb (54.885 kg)  BMI 18.95 kg/m2  SpO2 100%  Post vital signs: Reviewed  Level of consciousness: sedated  Complications: No apparent anesthesia complications

## 2015-01-01 NOTE — Transfer of Care (Signed)
Immediate Anesthesia Transfer of Care Note  Patient: Leon Foster  Procedure(s) Performed: Procedure(s): Left Transmetatarsal Amputation (Left)  Patient Location: PACU  Anesthesia Type:MAC  Level of Consciousness: awake, alert  and oriented  Airway & Oxygen Therapy: Patient Spontanous Breathing and Patient connected to nasal cannula oxygen  Post-op Assessment: Report given to RN and Post -op Vital signs reviewed and stable  Post vital signs: Reviewed and stable  Last Vitals:  Filed Vitals:   01/01/15 1155  BP: 121/88  Pulse: 68  Temp:   Resp: 16    Complications: No apparent anesthesia complications

## 2015-01-01 NOTE — Op Note (Signed)
01/01/2015  1:15 PM  PATIENT:  Leon Foster    PRE-OPERATIVE DIAGNOSIS:  Gangrene Left Foot  POST-OPERATIVE DIAGNOSIS:  Same  PROCEDURE:  Left Transmetatarsal Amputation  SURGEON:  Newt Minion, MD  PHYSICIAN ASSISTANT:None ANESTHESIA:   General  PREOPERATIVE INDICATIONS:  Leon Foster is a  74 y.o. male with a diagnosis of Gangrene Left Foot who failed conservative measures and elected for surgical management.    Patient had dry black gangrenous changes of 4 out of 5 toes.  The risks benefits and alternatives were discussed with the patient preoperatively including but not limited to the risks of infection, bleeding, nerve injury, cardiopulmonary complications, the need for revision surgery, among others, and the patient was willing to proceed.  OPERATIVE IMPLANTS: None  OPERATIVE FINDINGS: Calcified vessels with petechial bleeding  OPERATIVE PROCEDURE: Patient was brought to the operating room after undergoing a regional block. After adequate levels of anesthesia were obtained patient's left lower extremity was prepped using DuraPrep draped into a sterile field. A timeout was called. A fishmouth incision was made through the mid metatarsal region. A oscillating saw was used to perform a transmetatarsal amputation. Electrocautery was used for hemostasis. There was petechial bleeding in the muscle had good color and consistency and contractility. Incision was closed using 2-0 nylon. Sterile compressive dressing was applied. Patient was extubated taken to the PACU in stable condition plan for discharge back to skilled nursing.

## 2015-01-01 NOTE — Progress Notes (Signed)
Called and spoke with Alveta Heimlich, pt's nurse at [facility] [phone]) regarding meds and NPO status.

## 2015-01-04 ENCOUNTER — Encounter (HOSPITAL_COMMUNITY): Payer: Self-pay | Admitting: Orthopedic Surgery

## 2015-01-17 ENCOUNTER — Emergency Department (HOSPITAL_COMMUNITY): Payer: Medicare Other

## 2015-01-17 ENCOUNTER — Encounter (HOSPITAL_COMMUNITY): Payer: Self-pay | Admitting: Emergency Medicine

## 2015-01-17 ENCOUNTER — Emergency Department (HOSPITAL_COMMUNITY)
Admission: EM | Admit: 2015-01-17 | Discharge: 2015-01-17 | Disposition: A | Discharge status: Home / Self Care | Payer: Medicare Other | Attending: Emergency Medicine | Admitting: Emergency Medicine

## 2015-01-17 DIAGNOSIS — Q2733 Arteriovenous malformation of digestive system vessel: Secondary | ICD-10-CM | POA: Diagnosis not present

## 2015-01-17 DIAGNOSIS — Z8669 Personal history of other diseases of the nervous system and sense organs: Secondary | ICD-10-CM | POA: Insufficient documentation

## 2015-01-17 DIAGNOSIS — R55 Syncope and collapse: Secondary | ICD-10-CM | POA: Diagnosis not present

## 2015-01-17 DIAGNOSIS — I959 Hypotension, unspecified: Secondary | ICD-10-CM | POA: Diagnosis present

## 2015-01-17 DIAGNOSIS — Z8679 Personal history of other diseases of the circulatory system: Secondary | ICD-10-CM | POA: Diagnosis not present

## 2015-01-17 DIAGNOSIS — Z79899 Other long term (current) drug therapy: Secondary | ICD-10-CM | POA: Diagnosis not present

## 2015-01-17 DIAGNOSIS — Z87448 Personal history of other diseases of urinary system: Secondary | ICD-10-CM | POA: Insufficient documentation

## 2015-01-17 DIAGNOSIS — Z7902 Long term (current) use of antithrombotics/antiplatelets: Secondary | ICD-10-CM | POA: Insufficient documentation

## 2015-01-17 DIAGNOSIS — Z720 Tobacco use: Secondary | ICD-10-CM | POA: Insufficient documentation

## 2015-01-17 DIAGNOSIS — Z862 Personal history of diseases of the blood and blood-forming organs and certain disorders involving the immune mechanism: Secondary | ICD-10-CM | POA: Diagnosis not present

## 2015-01-17 LAB — CBC WITH DIFFERENTIAL/PLATELET
Basophils Absolute: 0.1 10*3/uL (ref 0.0–0.1)
Basophils Relative: 1 %
Eosinophils Absolute: 0.2 10*3/uL (ref 0.0–0.7)
Eosinophils Relative: 2 %
HEMATOCRIT: 29.1 % — AB (ref 39.0–52.0)
HEMOGLOBIN: 9.4 g/dL — AB (ref 13.0–17.0)
LYMPHS ABS: 1.8 10*3/uL (ref 0.7–4.0)
Lymphocytes Relative: 19 %
MCH: 28.5 pg (ref 26.0–34.0)
MCHC: 32.3 g/dL (ref 30.0–36.0)
MCV: 88.2 fL (ref 78.0–100.0)
MONOS PCT: 7 %
Monocytes Absolute: 0.6 10*3/uL (ref 0.1–1.0)
NEUTROS ABS: 6.8 10*3/uL (ref 1.7–7.7)
NEUTROS PCT: 71 %
Platelets: 476 10*3/uL — ABNORMAL HIGH (ref 150–400)
RBC: 3.3 MIL/uL — ABNORMAL LOW (ref 4.22–5.81)
RDW: 16.5 % — ABNORMAL HIGH (ref 11.5–15.5)
WBC: 9.5 10*3/uL (ref 4.0–10.5)

## 2015-01-17 LAB — URINALYSIS, ROUTINE W REFLEX MICROSCOPIC
Bilirubin Urine: NEGATIVE
GLUCOSE, UA: NEGATIVE mg/dL
HGB URINE DIPSTICK: NEGATIVE
Ketones, ur: NEGATIVE mg/dL
Leukocytes, UA: NEGATIVE
Nitrite: NEGATIVE
Protein, ur: NEGATIVE mg/dL
SPECIFIC GRAVITY, URINE: 1.015 (ref 1.005–1.030)
Urobilinogen, UA: 0.2 mg/dL (ref 0.0–1.0)
pH: 6 (ref 5.0–8.0)

## 2015-01-17 LAB — I-STAT TROPONIN, ED: TROPONIN I, POC: 0 ng/mL (ref 0.00–0.08)

## 2015-01-17 LAB — BASIC METABOLIC PANEL
Anion gap: 10 (ref 5–15)
BUN: 8 mg/dL (ref 6–20)
CHLORIDE: 103 mmol/L (ref 101–111)
CO2: 25 mmol/L (ref 22–32)
CREATININE: 1.03 mg/dL (ref 0.61–1.24)
Calcium: 9.1 mg/dL (ref 8.9–10.3)
GFR calc Af Amer: >60 mL/min (ref >60)
GFR calc non Af Amer: >60 mL/min (ref >60)
GLUCOSE: 97 mg/dL (ref 65–99)
Potassium: 4.3 mmol/L (ref 3.5–5.1)
Sodium: 138 mmol/L (ref 135–145)

## 2015-01-17 LAB — MAGNESIUM: MAGNESIUM: 1.7 mg/dL (ref 1.7–2.4)

## 2015-01-17 LAB — I-STAT CG4 LACTIC ACID, ED: Lactic Acid, Venous: 1.36 mmol/L (ref 0.5–2.0)

## 2015-01-17 MED ORDER — SODIUM CHLORIDE 0.9 % IV BOLUS (SEPSIS)
1000.0000 mL | Freq: Once | INTRAVENOUS | Status: AC
  Administered 2015-01-17: 1000 mL via INTRAVENOUS

## 2015-01-17 NOTE — ED Notes (Signed)
Patient undressed, in gown, on monitor, continuous pulse oximetry and blood pressure cuff; visitors at bedside 

## 2015-01-17 NOTE — ED Provider Notes (Signed)
CSN: [id number]     Arrival date & time 01/17/15  1337 History   First MD Initiated Contact with Patient 01/17/15 1456     Chief Complaint  Patient presents with  . Hypotension     (Consider location/radiation/quality/duration/timing/severity/associated sxs/prior Treatment) HPI 74 year old male who presents with concern for hypotension. He is a poor historian and has a history of dementia, alcohol dependence, CKD, pulmonary hypertension and peripheral arterial disease. History is primarily provided by the patient's wife. They were at church today, and sitting in the pew's, when he suddenly began tremoring. She reports that he became sweaty and clammy looking. His eyes began to roll backwards, but when she called his name,  he was able to answer. He did not have any tongue biting, true loss of consciousness, or urinary incontinence. Denies any nausea or vomiting, and he did not seem to have any chest pain or difficulty breathing. She reports that this is happened 2 or 3 times before, and has had workup for seizures, that she reports was unremarkable. Currently resides at [facility], and she reports that he has been doing well there. No reported fevers, vomiting, diarrhea, decreased appetite, or confusion. Patient reports that he does not remember much of the incident earlier today, and currently has no complaints. EMS called, and on their arrival he had initial SBP 70s. On arrival to our ED, blood pressures have remained normal.   Past Medical History  Diagnosis Date  . Tobacco dependence   . Alcoholic ([place])   . AVM (arteriovenous malformation) of colon     colonoscopy 2011 [facility]   . Cardiomyopathy ([place])     from recent hospitalization notes  . Acute renal failure ([place]) 11/2014    from recent hospitalization notes  . Gangrene of digit     left toe  . Confusion   . PAD (peripheral artery disease) ([place])   . Leukocytosis   . Pulmonary hypertension ([place])   . Seizures ([place])      1 time  seizure on 12/28/14 - seen in ED   Past Surgical History  Procedure Laterality Date  . Peripheral vascular catheterization N/A 12/14/2014    Procedure: Abdominal Aortogram;  Surgeon: Elam Dutch, MD;  Location: [facility];  Service: Cardiovascular;  Laterality: N/A;  . Lower extremity angiogram Bilateral 12/14/2014    Procedure: Lower Extremity Angiogram;  Surgeon: Elam Dutch, MD;  Location: [facility];  Service: Cardiovascular;  Laterality: Bilateral;  . Peripheral vascular catheterization Left 12/14/2014    Procedure: Peripheral Vascular Intervention;  Surgeon: Elam Dutch, MD;  Location: [facility];  Service: Cardiovascular;  Laterality: Left;  SFA  . Amputation Left 01/01/2015    Procedure: Left Transmetatarsal Amputation;  Surgeon: Newt Minion, MD;  Location: [facility];  Service: Orthopedics;  Laterality: Left;   Family History  Problem Relation Age of Onset  . Stroke Sister   . Deep vein thrombosis Sister   . Cancer Brother   . Diabetes Brother   . Hyperlipidemia Brother   . Hypertension Brother   . Heart disease Father   . Hypertension Sister    Social History  Substance Use Topics  . Smoking status: Current Every Day Smoker -- 1.00 packs/day for 55 years  . Smokeless tobacco: Never Used  . Alcohol Use: 0.0 oz/week    0 Standard drinks or equivalent per week     Comment: 2-3 pints of wine daily    Review of Systems  Unable to perform ROS: Dementia     Allergies  Review of patient's allergies indicates no known allergies.  Home Medications   Prior to Admission medications   Medication Sig Start Date End Date Taking? Authorizing Provider  acetaminophen (TYLENOL) 325 MG tablet Take 650 mg by mouth every 6 (six) hours as needed for headache.   Yes Historical Provider, MD  bisacodyl (DULCOLAX) 10 MG suppository Place 10 mg rectally daily as needed for mild constipation or moderate constipation (if not relieved by MOM).   Yes Historical  Provider, MD  clopidogrel (PLAVIX) 75 MG tablet Take 1 tablet (75 mg total) by mouth daily with breakfast. 12/15/14  Yes Bonnielee Haff, MD  magnesium hydroxide (MILK OF MAGNESIA) 400 MG/5ML suspension Take 30 mLs by mouth daily as needed for mild constipation.   Yes Historical Provider, MD  metoprolol tartrate (LOPRESSOR) 25 MG tablet Take 0.5 tablets (12.5 mg total) by mouth 2 (two) times daily. 12/15/14  Yes Bonnielee Haff, MD  Multiple Vitamin (MULTIVITAMIN WITH MINERALS) TABS tablet Take 1 tablet by mouth daily. 12/15/14  Yes Bonnielee Haff, MD  nicotine (NICODERM CQ - DOSED IN MG/24 HOURS) 21 mg/24hr patch Place 1 patch (21 mg total) onto the skin daily. 12/15/14  Yes Bonnielee Haff, MD  oxyCODONE (OXY IR/ROXICODONE) 5 MG immediate release tablet Take 1 tablet (5 mg total) by mouth every 6 (six) hours as needed for moderate pain. 12/15/14  Yes Bonnielee Haff, MD  Sodium Phosphates (RA SALINE ENEMA) 19-7 GM/118ML ENEM Place 1 enema rectally once as needed (if no relief by bisacodyl).   Yes Historical Provider, MD  thiamine 100 MG tablet Take 1 tablet (100 mg total) by mouth daily. 12/15/14  Yes Bonnielee Haff, MD  Nutritional Supplements (FEEDING SUPPLEMENT, NEPRO CARB STEADY,) LIQD Take 237 mLs by mouth 3 (three) times daily with meals. 12/15/14   Bonnielee Haff, MD   BP 120/66 mmHg  Pulse 89  Temp(Src) 97.8 F (36.6 C) (Oral)  Resp 16  SpO2 100% Physical Exam Physical Exam  Nursing note and vitals reviewed. Constitutional: Chronically ill appearing, non-toxic and in no acute distress Head: Normocephalic and atraumatic.  Mouth/Throat: Oropharynx is clear and moist.  Eyes: PERRL Neck: Normal range of motion. Neck supple.  Cardiovascular: Normal rate and regular rhythm.  No edema. Pulmonary/Chest: Effort normal and breath sounds normal.  Abdominal: Soft. There is no tenderness. There is no rebound and no guarding.  Musculoskeletal: Normal range of motion.  Neurological: Alert, no facial  droop, fluent speech, moves all extremities symmetrically Skin: Skin is warm and dry.  Psychiatric: Cooperative  ED Course  Procedures (including critical care time) Labs Review Labs Reviewed  CBC WITH DIFFERENTIAL/PLATELET - Abnormal; Notable for the following:    RBC 3.30 (*)    Hemoglobin 9.4 (*)    HCT 29.1 (*)    RDW 16.5 (*)    Platelets 476 (*)    All other components within normal limits  BASIC METABOLIC PANEL  URINALYSIS, ROUTINE W REFLEX MICROSCOPIC (NOT AT [facility])  MAGNESIUM  I-STAT TROPOININ, ED  I-STAT CG4 LACTIC ACID, ED    Imaging Review Dg Chest Port 1 View  01/17/2015   CLINICAL DATA:  Acute seizure and hypotension.  EXAM: PORTABLE CHEST 1 VIEW  COMPARISON:  11/27/2014 and prior chest radiographs  FINDINGS: The cardiomediastinal silhouette is unremarkable.  There is no evidence of focal airspace disease, pulmonary edema, suspicious pulmonary nodule/mass, pleural effusion, or pneumothorax.  No acute bony abnormalities are identified.  IMPRESSION: No  active disease.   Electronically Signed   By: Margarette Canada M.D.   On: 01/17/2015 15:09   I have personally reviewed and evaluated these images and lab results as part of my medical decision-making.   EKG Interpretation   Date/Time:  Sunday January 17 2015 13:45:52 EDT Ventricular Rate:  66 PR Interval:  130 QRS Duration: 81 QT Interval:  449 QTC Calculation: 470 R Axis:   35 Text Interpretation:  Sinus rhythm Ventricular premature complex No  significant change since last tracing Confirmed by Maryan Rued  MD, Loree Fee  [phone]) on 01/17/2015 2:06:30 PM      MDM   Final diagnoses:  Near syncope    74 year old male who presents with episode of near syncope. Asymptomatic on arrival. Normotensive on arrival and throughout ED stay. Orthostatics positive and likely explanation of his near syncopal episodes. EKG non-ishcemic and without stigmata of arrhythmia. Basic blood work unremarkable for electrolyte or metabolic  derangements. Negative cardiac markers, and symptoms does not seem consistent ACS.Recent ECHO performed 11/2014 with normal EF and no major valvular disease. Observed in ED and asymptomatic with ambulation. Feels improved after IVF and PO intake. BP remains normal and symptoms likely 2/2 orthostasis. He will follow-up closely with PCP. Strict return instructions reviewed. Discharged to [facility].    Forde Dandy, MD 01/18/15 [phone]

## 2015-01-17 NOTE — ED Notes (Signed)
Meal tray ordered for pt  

## 2015-01-17 NOTE — ED Notes (Signed)
To ED via [facility] from church with c/o shaking episode and low blood pressure. Pt was with sister at church when this happened, she states that this has happened 2 other times with a normal MRI. Pt is a resident at [facility] for rehab for recent left foot amputation.

## 2015-01-17 NOTE — Discharge Instructions (Signed)
Return without fail for worsening symptoms, including passing out, chest pain, difficulty breathing, or any other symptoms concerning to you.  Near-Syncope Near-syncope (commonly known as near fainting) is sudden weakness, dizziness, or feeling like you might pass out. During an episode of near-syncope, you may also develop pale skin, have tunnel vision, or feel sick to your stomach (nauseous). Near-syncope may occur when getting up after sitting or while standing for a long time. It is caused by a sudden decrease in blood flow to the brain. This decrease can result from various causes or triggers, most of which are not serious. However, because near-syncope can sometimes be a sign of something serious, a medical evaluation is required. The specific cause is often not determined. HOME CARE INSTRUCTIONS  Monitor your condition for any changes. The following actions may help to alleviate any discomfort you are experiencing:  Have someone stay with you until you feel stable.  Lie down right away and prop your feet up if you start feeling like you might faint. Breathe deeply and steadily. Wait until all the symptoms have passed. Most of these episodes last only a few minutes. You may feel tired for several hours.   Drink enough fluids to keep your urine clear or pale yellow.   If you are taking blood pressure or heart medicine, get up slowly when seated or lying down. Take several minutes to sit and then stand. This can reduce dizziness.  Follow up with your health care provider as directed. SEEK IMMEDIATE MEDICAL CARE IF:   You have a severe headache.   You have unusual pain in the chest, abdomen, or back.   You are bleeding from the mouth or rectum, or you have black or tarry stool.   You have an irregular or very fast heartbeat.   You have repeated fainting or have seizure-like jerking during an episode.   You faint when sitting or lying down.   You have confusion.   You have  difficulty walking.   You have severe weakness.   You have vision problems.  MAKE SURE YOU:   Understand these instructions.  Will watch your condition.  Will get help right away if you are not doing well or get worse. Document Released: 04/03/2005 Document Revised: 04/08/2013 Document Reviewed: 09/06/2012 [facility] Patient Information 2015 [place], [place]. This information is not intended to replace advice given to you by your health care provider. Make sure you discuss any questions you have with your health care provider.

## 2015-01-19 ENCOUNTER — Encounter: Payer: Self-pay | Admitting: Vascular Surgery

## 2015-01-21 ENCOUNTER — Ambulatory Visit (HOSPITAL_COMMUNITY)
Admission: RE | Admit: 2015-01-21 | Discharge: 2015-01-21 | Disposition: A | Discharge status: Home / Self Care | Payer: No Typology Code available for payment source | Source: Ambulatory Visit | Attending: Vascular Surgery | Admitting: Vascular Surgery

## 2015-01-21 ENCOUNTER — Ambulatory Visit (INDEPENDENT_AMBULATORY_CARE_PROVIDER_SITE_OTHER)
Admission: RE | Admit: 2015-01-21 | Discharge: 2015-01-21 | Disposition: A | Discharge status: Home / Self Care | Payer: No Typology Code available for payment source | Source: Ambulatory Visit | Attending: Vascular Surgery | Admitting: Vascular Surgery

## 2015-01-21 ENCOUNTER — Encounter: Payer: Self-pay | Admitting: Vascular Surgery

## 2015-01-21 ENCOUNTER — Ambulatory Visit (INDEPENDENT_AMBULATORY_CARE_PROVIDER_SITE_OTHER): Payer: Medicare Other | Admitting: Vascular Surgery

## 2015-01-21 VITALS — BP 103/62 | HR 90 | Temp 97.9°F | Ht 67.0 in | Wt 121.0 lb

## 2015-01-21 DIAGNOSIS — Z87891 Personal history of nicotine dependence: Secondary | ICD-10-CM | POA: Diagnosis not present

## 2015-01-21 DIAGNOSIS — Z9862 Peripheral vascular angioplasty status: Secondary | ICD-10-CM | POA: Insufficient documentation

## 2015-01-21 DIAGNOSIS — I10 Essential (primary) hypertension: Secondary | ICD-10-CM | POA: Diagnosis not present

## 2015-01-21 DIAGNOSIS — I739 Peripheral vascular disease, unspecified: Secondary | ICD-10-CM | POA: Diagnosis not present

## 2015-01-21 NOTE — Progress Notes (Signed)
Patient is 74 year old male who returns for follow-up today. He underwent a left superficial femoral artery stenting procedure on 12/14/2014. He reports no pain in left foot. He subsequently underwent a transmetatarsal amputation of the left foot by Dr. Sharol Given. He saw Dr. Sharol Given earlier in the week. He has had some wound healing issues with a transmetatarsal amputation. He has follow-up scheduled with Dr. Sharol Given in 2 weeks. The patient is currently on Plavix. He is currently residing in a skilled nursing facility. For the most part he is nonambulatory.  Current Outpatient Prescriptions on File Prior to Visit  Medication Sig Dispense Refill  . acetaminophen (TYLENOL) 325 MG tablet Take 650 mg by mouth every 6 (six) hours as needed for headache.    . bisacodyl (DULCOLAX) 10 MG suppository Place 10 mg rectally daily as needed for mild constipation or moderate constipation (if not relieved by MOM).    . clopidogrel (PLAVIX) 75 MG tablet Take 1 tablet (75 mg total) by mouth daily with breakfast.    . magnesium hydroxide (MILK OF MAGNESIA) 400 MG/5ML suspension Take 30 mLs by mouth daily as needed for mild constipation.    . metoprolol tartrate (LOPRESSOR) 25 MG tablet Take 0.5 tablets (12.5 mg total) by mouth 2 (two) times daily.    . Multiple Vitamin (MULTIVITAMIN WITH MINERALS) TABS tablet Take 1 tablet by mouth daily.    . nicotine (NICODERM CQ - DOSED IN MG/24 HOURS) 21 mg/24hr patch Place 1 patch (21 mg total) onto the skin daily. 28 patch 0  . Nutritional Supplements (FEEDING SUPPLEMENT, NEPRO CARB STEADY,) LIQD Take 237 mLs by mouth 3 (three) times daily with meals.  0  . oxyCODONE (OXY IR/ROXICODONE) 5 MG immediate release tablet Take 1 tablet (5 mg total) by mouth every 6 (six) hours as needed for moderate pain. 30 tablet 0  . Sodium Phosphates (RA SALINE ENEMA) 19-7 GM/118ML ENEM Place 1 enema rectally once as needed (if no relief by bisacodyl).    . thiamine 100 MG tablet Take 1 tablet (100 mg total)  by mouth daily.     No current facility-administered medications on file prior to visit.   Review of systems: He denies shortness of breath. He denies chest pain.  Physical exam:  Filed Vitals:   01/21/15 1309  BP: 103/62  Pulse: 90  Temp: 97.9 F (36.6 C)  TempSrc: Oral  Height: 5\' 7"  (1.702 m)  Weight: 121 lb (54.885 kg)  SpO2: 100%    Extremities: 2+ femoral pulses bilaterally absent popliteal and pedal pulses poorly healing left transmetatarsal amputation with some fibrinous tissue and some islands of granulation tissue  Data: Patient had bilateral ABIs performed today which were 1.1 on the left 0.93 on the right duplex ultrasound of the patient's left SFA stents there is this is widely patent. He has one-vessel runoff via the peroneal artery.  Assessment: Poorly healing left transmetatarsal amputation. Patient may eventually require a left below-knee amputation. The patient's left superficial femoral artery stent is widely patent with one-vessel runoff via the peroneal artery.  Plan: Patient will follow-up with me in 3 months for repeat duplex exam and ABIs. He will continue to have his scheduled follow-up with Dr. Sharol Given to address his transmetatarsal amputation.  Ruta Hinds, MD [facility] Office: [phone] Pager: [phone]

## 2015-01-21 NOTE — Addendum Note (Signed)
Addended by: Dorthula Rue L on: 01/21/2015 02:46 PM   Modules accepted: Orders

## 2015-01-22 ENCOUNTER — Non-Acute Institutional Stay (SKILLED_NURSING_FACILITY): Payer: Medicare Other | Admitting: Nurse Practitioner

## 2015-01-22 DIAGNOSIS — I10 Essential (primary) hypertension: Secondary | ICD-10-CM | POA: Diagnosis not present

## 2015-01-22 DIAGNOSIS — I272 Other secondary pulmonary hypertension: Secondary | ICD-10-CM | POA: Diagnosis not present

## 2015-01-22 DIAGNOSIS — I96 Gangrene, not elsewhere classified: Secondary | ICD-10-CM | POA: Diagnosis not present

## 2015-01-22 DIAGNOSIS — D638 Anemia in other chronic diseases classified elsewhere: Secondary | ICD-10-CM | POA: Diagnosis not present

## 2015-01-22 DIAGNOSIS — K1379 Other lesions of oral mucosa: Secondary | ICD-10-CM | POA: Diagnosis not present

## 2015-01-22 NOTE — Progress Notes (Signed)
Patient ID: Leon Foster, male   DOB: 07-Aug-1940, 74 y.o.   MRN: [id number]     Nursing Home Location:  [facility]   Place of Service: SNF (41)  PCP: Philis Fendt, MD  No Known Allergies  Chief Complaint  Patient presents with  . Medical Management of Chronic Issues  . Acute Visit    HPI:  Patient is a 74 y.o. male seen today at [facility] for medical management of his chronic issues.  He has a past medical history of ETOH abuse, tobacco abuse, PAD, HTN, and gangrene of toes.  He underwent femoral artery stenting in Aug 2016 and is followed by vascular surgery.  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   Nursing had concerns about him having an abscess tooth per COC and placed on dental list. Nurse today unaware or concern. Pt without pain in mouth and no fevers.   Review of Systems:  Review of Systems  Constitutional: Negative for fever, chills and fatigue.  HENT: Negative for congestion, postnasal drip, rhinorrhea and sore throat.   Respiratory: Negative for cough, shortness of breath and wheezing.   Cardiovascular: Negative for chest pain, palpitations and leg swelling.  Gastrointestinal: Negative for abdominal pain, diarrhea and constipation.  Genitourinary: Negative for dysuria, urgency and frequency.  Musculoskeletal: Positive for gait problem (Patient is nonambulatory). Negative for myalgias and arthralgias.  Skin: Negative.   Neurological: Negative for dizziness, light-headedness and headaches.  Psychiatric/Behavioral: Negative for self-injury and agitation. The patient is not nervous/anxious.     Past Medical History  Diagnosis Date  . Tobacco dependence   . Alcoholic ([place])   . AVM (arteriovenous malformation) of colon     colonoscopy 2011 [facility]   . Cardiomyopathy ([place])     from recent hospitalization notes  . Acute renal failure ([place]) 11/2014    from recent hospitalization  notes  . Gangrene of digit     left toe  . Confusion   . PAD (peripheral artery disease) ([place])   . Leukocytosis   . Pulmonary hypertension ([place])   . Seizures ([place])      1 time seizure on 12/28/14 - seen in ED   Past Surgical History  Procedure Laterality Date  . Peripheral vascular catheterization N/A 12/14/2014    Procedure: Abdominal Aortogram;  Surgeon: Elam Dutch, MD;  Location: [place] [facility];  Service: Cardiovascular;  Laterality: N/A;  . Lower extremity angiogram Bilateral 12/14/2014    Procedure: Lower Extremity Angiogram;  Surgeon: Elam Dutch, MD;  Location: [place] [facility];  Service: Cardiovascular;  Laterality: Bilateral;  . Peripheral vascular catheterization Left 12/14/2014    Procedure: Peripheral Vascular Intervention;  Surgeon: Elam Dutch, MD;  Location: [place] [facility];  Service: Cardiovascular;  Laterality: Left;  SFA  . Amputation Left 01/01/2015    Procedure: Left Transmetatarsal Amputation;  Surgeon: Newt Minion, MD;  Location: [place];  Service: Orthopedics;  Laterality: Left;   Social History:   reports that he has been smoking Cigarettes.  He has a 55 pack-year smoking history. He has never used smokeless tobacco. He reports that he drinks alcohol. He reports that he does not use illicit drugs.  Family History  Problem Relation Age of Onset  . Stroke Sister   . Deep vein thrombosis Sister   . Cancer Brother   . Diabetes Brother   . Hyperlipidemia Brother   . Hypertension  Brother   . Heart disease Father   . Hypertension Sister     Medications: Patient's Medications  New Prescriptions   No medications on file  Previous Medications   ACETAMINOPHEN (TYLENOL) 325 MG TABLET    Take 650 mg by mouth every 6 (six) hours as needed for headache.   BISACODYL (DULCOLAX) 10 MG SUPPOSITORY    Place 10 mg rectally daily as needed for mild constipation or moderate constipation (if not relieved by MOM).   CLOPIDOGREL (PLAVIX) 75 MG TABLET     Take 1 tablet (75 mg total) by mouth daily with breakfast.   FOLIC ACID (FOLVITE) 1 MG TABLET    Take 1 mg by mouth daily.   MAGNESIUM HYDROXIDE (MILK OF MAGNESIA) 400 MG/5ML SUSPENSION    Take 30 mLs by mouth daily as needed for mild constipation.   METOPROLOL TARTRATE (LOPRESSOR) 25 MG TABLET    Take 0.5 tablets (12.5 mg total) by mouth 2 (two) times daily.   MULTIPLE VITAMIN (MULTIVITAMIN WITH MINERALS) TABS TABLET    Take 1 tablet by mouth daily.   NICOTINE (NICODERM CQ - DOSED IN MG/24 HOURS) 21 MG/24HR PATCH    Place 1 patch (21 mg total) onto the skin daily.   NUTRITIONAL SUPPLEMENTS (FEEDING SUPPLEMENT, NEPRO CARB STEADY,) LIQD    Take 237 mLs by mouth 3 (three) times daily with meals.   OXYCODONE (OXY IR/ROXICODONE) 5 MG IMMEDIATE RELEASE TABLET    Take 1 tablet (5 mg total) by mouth every 6 (six) hours as needed for moderate pain.   SODIUM PHOSPHATES (RA SALINE ENEMA) 19-7 GM/118ML ENEM    Place 1 enema rectally once as needed (if no relief by bisacodyl).   THIAMINE 100 MG TABLET    Take 1 tablet (100 mg total) by mouth daily.   VITAMIN B-12 (CYANOCOBALAMIN) 100 MCG TABLET    Take 100 mcg by mouth daily.  Modified Medications   No medications on file  Discontinued Medications   No medications on file     Physical Exam: Filed Vitals:   01/22/15 1001  BP: 124/73  Pulse: 86  Temp: 97.6 F (36.4 C)  TempSrc: Oral  Resp: 20  Weight: 133 lb 6.4 oz (60.51 kg)    Physical Exam  Constitutional: He is oriented to person, place, and time. No distress.  Frail elderly male   HENT:  Head: Normocephalic and atraumatic.  Right Ear: External ear normal.  Left Ear: External ear normal.  Eyes: Pupils are equal, round, and reactive to light.  Neck: Normal range of motion. Neck supple.  Cardiovascular: Normal rate, regular rhythm and normal heart sounds.   Pulmonary/Chest: Effort normal and breath sounds normal.  Abdominal: Soft. Bowel sounds are normal.  Musculoskeletal: Normal range  of motion.  Left foot is wrapped with ACE bandage.   Lymphadenopathy:    He has no cervical adenopathy.  Neurological: He is alert and oriented to person, place, and time.  Skin: Skin is warm and dry. He is not diaphoretic.    Labs reviewed: Basic Metabolic Panel:  Recent Labs  11/28/14 0249 11/28/14 1529 11/29/14 0329 11/30/14 0305  12/18/14 1239 12/22/14 0656 12/28/14 1156 01/17/15 1501 01/17/15 1504  NA 138 140 139 141  < > 139 138 140 138  --   K 3.4* 3.3* 4.2 3.7  < > 4.2 3.6 4.0 4.3  --   CL 105 108 109 114*  < > 106  --  104 103  --   CO2 22  20* 20* 18*  < > 27  --  29 25  --   GLUCOSE 91 121* 84 95  < > 100*  --  90 97  --   BUN 21* 21* 21* 22*  < > 8 13 7 8   --   CREATININE 6.91* 6.88* 6.95* 7.02*  < > 1.34* 1.2 1.04 1.03  --   CALCIUM 7.6* 6.9* 7.7* 7.7*  < > 8.9  --  9.2 9.1  --   MG  --   --  2.0 1.9  --   --   --   --   --  1.7  PHOS 4.4 3.9 4.5  --   --   --   --   --   --   --   < > = values in this interval not displayed. Liver Function Tests:  Recent Labs  12/14/14 0343 12/18/14 1239 12/22/14 0656 12/28/14 1156  AST 17 18 11* 13*  ALT 8* 10* 8* 6*  ALKPHOS 46 51 52 60  BILITOT 0.4 0.2*  --  0.5  PROT 6.6 7.2  --  7.0  ALBUMIN 2.3* 2.6*  --  2.6*   No results for input(s): LIPASE, AMYLASE in the last 8760 hours.  Recent Labs  11/27/14 1043  AMMONIA 26   CBC:  Recent Labs  12/18/14 1239 12/22/14 0656 12/28/14 1156 01/17/15 1501  WBC 5.8 9.3 10.7* 9.5  NEUTROABS 2.4  --  7.1 6.8  HGB 9.8* 9.1* 9.5* 9.4*  HCT 29.9* 28* 28.6* 29.1*  MCV 94.0  --  91.7 88.2  PLT 405* 380 388 476*   TSH:  Recent Labs  11/27/14 1842  TSH 1.420   A1C: Lab Results  Component Value Date   HGBA1C 5.0 11/27/2014   Lipid Panel: No results for input(s): CHOL, HDL, LDLCALC, TRIG, CHOLHDL, LDLDIRECT in the last 8760 hours.    Assessment/Plan 1. Pulmonary hypertension (HCC) Stable. Patient not on oxygen, no SOB.    2. Essential  hypertension Stable.  Blood pressures within desirable range, continue metoprolol.   3. Mouth pain Patient is not complaining of mouth pain or tooth pain.  Nursing is unaware of any issue in his mouth.  No abscess noted on exam.  To monitor and keep dental app  4. Anemia, chronic disease Stable.  Last hbg was 9.4, this seems to be his baseline.    5. Gangrene of toe-left second toe S/p left transmetatarsal amputation. Slow healing wound and following closely with wound care and ortho. Will see Ortho in 2 weeks. Ongoing follow up treatment nurse and wound care.       Carlos American. Harle Battiest  [facility] [phone] 8 am - 5 pm) [phone] (after hours)

## 2015-01-29 ENCOUNTER — Non-Acute Institutional Stay (SKILLED_NURSING_FACILITY): Payer: Medicare Other | Admitting: Nurse Practitioner

## 2015-01-29 DIAGNOSIS — Z720 Tobacco use: Secondary | ICD-10-CM | POA: Diagnosis not present

## 2015-01-29 DIAGNOSIS — F1010 Alcohol abuse, uncomplicated: Secondary | ICD-10-CM

## 2015-01-29 DIAGNOSIS — I10 Essential (primary) hypertension: Secondary | ICD-10-CM | POA: Diagnosis not present

## 2015-01-29 DIAGNOSIS — D638 Anemia in other chronic diseases classified elsewhere: Secondary | ICD-10-CM | POA: Diagnosis not present

## 2015-01-29 DIAGNOSIS — I739 Peripheral vascular disease, unspecified: Secondary | ICD-10-CM

## 2015-01-29 DIAGNOSIS — I96 Gangrene, not elsewhere classified: Secondary | ICD-10-CM | POA: Diagnosis not present

## 2015-01-29 NOTE — Progress Notes (Signed)
Patient ID: Leon Foster, male   DOB: [birth year], 74 y.o.   MRN: [id number]    Nursing Home Location:  [facility]   Place of Service: SNF (73)  PCP: Philis Fendt, MD  No Known Allergies  Chief Complaint  Patient presents with  . Discharge Note    HPI:  Patient is a 74 y.o. male seen today at [facility] medical management of his chronic issues. He has a past medical history of ETOH abuse, tobacco abuse, PAD, HTN, and gangrene of toes. pt is at [facility] after hospitalization, pt underwent femoral artery stenting in Aug 2016 and is followed by vascular surgery. 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 Patient currently doing well with therapy, now stable to discharge home with home health.  Review of Systems:  Review of Systems  Constitutional: Negative for fever, chills and fatigue.  HENT: Negative for congestion, postnasal drip, rhinorrhea and sore throat.   Respiratory: Negative for cough, shortness of breath and wheezing.   Cardiovascular: Negative for chest pain, palpitations and leg swelling.  Gastrointestinal: Negative for abdominal pain, diarrhea and constipation.  Genitourinary: Negative for dysuria, urgency and frequency.  Musculoskeletal: Positive for gait problem (Patient is NWB to left foot). Negative for myalgias and arthralgias.  Skin: Negative.   Neurological: Negative for dizziness, light-headedness and headaches.  Psychiatric/Behavioral: Negative for self-injury and agitation. The patient is not nervous/anxious.     Past Medical History  Diagnosis Date  . Tobacco dependence   . Alcoholic ([place])   . AVM (arteriovenous malformation) of colon     colonoscopy 2011 [facility]   . Cardiomyopathy ([place])     from recent hospitalization notes  . Acute renal failure ([place]) 11/2014    from recent hospitalization notes  . Gangrene of digit     left toe  . Confusion   . PAD  (peripheral artery disease) ([place])   . Leukocytosis   . Pulmonary hypertension ([place])   . Seizures ([place])      1 time seizure on 12/28/14 - seen in ED   Past Surgical History  Procedure Laterality Date  . Peripheral vascular catheterization N/A 12/14/2014    Procedure: Abdominal Aortogram;  Surgeon: Elam Dutch, MD;  Location: [place] [facility];  Service: Cardiovascular;  Laterality: N/A;  . Lower extremity angiogram Bilateral 12/14/2014    Procedure: Lower Extremity Angiogram;  Surgeon: Elam Dutch, MD;  Location: [place] [facility];  Service: Cardiovascular;  Laterality: Bilateral;  . Peripheral vascular catheterization Left 12/14/2014    Procedure: Peripheral Vascular Intervention;  Surgeon: Elam Dutch, MD;  Location: [place] [facility];  Service: Cardiovascular;  Laterality: Left;  SFA  . Amputation Left 01/01/2015    Procedure: Left Transmetatarsal Amputation;  Surgeon: Newt Minion, MD;  Location: [place];  Service: Orthopedics;  Laterality: Left;   Social History:   reports that he has been smoking Cigarettes.  He has a 55 pack-year smoking history. He has never used smokeless tobacco. He reports that he drinks alcohol. He reports that he does not use illicit drugs.  Family History  Problem Relation Age of Onset  . Stroke Sister   . Deep vein thrombosis Sister   . Cancer Brother   . Diabetes Brother   . Hyperlipidemia Brother   . Hypertension Brother   . Heart disease Father   . Hypertension Sister     Medications: Patient's Medications  New  Prescriptions   No medications on file  Previous Medications   ACETAMINOPHEN (TYLENOL) 325 MG TABLET    Take 650 mg by mouth every 6 (six) hours as needed for headache.   BISACODYL (DULCOLAX) 10 MG SUPPOSITORY    Place 10 mg rectally daily as needed for mild constipation or moderate constipation (if not relieved by MOM).   CLOPIDOGREL (PLAVIX) 75 MG TABLET    Take 1 tablet (75 mg total) by mouth daily with breakfast.   FOLIC  ACID (FOLVITE) 1 MG TABLET    Take 1 mg by mouth daily.   MAGNESIUM HYDROXIDE (MILK OF MAGNESIA) 400 MG/5ML SUSPENSION    Take 30 mLs by mouth daily as needed for mild constipation.   METOPROLOL TARTRATE (LOPRESSOR) 25 MG TABLET    Take 0.5 tablets (12.5 mg total) by mouth 2 (two) times daily.   MULTIPLE VITAMIN (MULTIVITAMIN WITH MINERALS) TABS TABLET    Take 1 tablet by mouth daily.   NICOTINE (NICODERM CQ - DOSED IN MG/24 HOURS) 21 MG/24HR PATCH    Place 1 patch (21 mg total) onto the skin daily.   NITROGLYCERIN (NITRODUR - DOSED IN MG/24 HR) 0.2 MG/HR PATCH    Place 0.2 mg onto the skin daily. 2 patches to left ankle daily   NUTRITIONAL SUPPLEMENTS (FEEDING SUPPLEMENT, NEPRO CARB STEADY,) LIQD    Take 237 mLs by mouth 3 (three) times daily with meals.   OXYCODONE (OXY IR/ROXICODONE) 5 MG IMMEDIATE RELEASE TABLET    Take 1 tablet (5 mg total) by mouth every 6 (six) hours as needed for moderate pain.   SODIUM PHOSPHATES (RA SALINE ENEMA) 19-7 GM/118ML ENEM    Place 1 enema rectally once as needed (if no relief by bisacodyl).   THIAMINE 100 MG TABLET    Take 1 tablet (100 mg total) by mouth daily.   VITAMIN B-12 (CYANOCOBALAMIN) 100 MCG TABLET    Take 100 mcg by mouth daily.  Modified Medications   No medications on file  Discontinued Medications   No medications on file     Physical Exam: Filed Vitals:   01/29/15 1300  BP: 130/58  Pulse: 70  Temp: 97.9 F (36.6 C)  Resp: 20    Physical Exam  Constitutional: No distress.  Frail elderly male   HENT:  Head: Normocephalic and atraumatic.  Right Ear: External ear normal.  Left Ear: External ear normal.  Eyes: Pupils are equal, round, and reactive to light.  Neck: Normal range of motion. Neck supple.  Cardiovascular: Normal rate, regular rhythm and normal heart sounds.   Pulmonary/Chest: Effort normal and breath sounds normal.  Abdominal: Soft. Bowel sounds are normal.  Musculoskeletal: Normal range of motion.  Left toe amputee,  dressing CDI  Lymphadenopathy:    He has no cervical adenopathy.  Neurological: He is alert.  Skin: Skin is warm and dry. He is not diaphoretic.    Labs reviewed: Basic Metabolic Panel:  Recent Labs  11/28/14 0249 11/28/14 1529 11/29/14 0329 11/30/14 0305  12/18/14 1239 12/22/14 0656 12/28/14 1156 01/17/15 1501 01/17/15 1504  NA 138 140 139 141  < > 139 138 140 138  --   K 3.4* 3.3* 4.2 3.7  < > 4.2 3.6 4.0 4.3  --   CL 105 108 109 114*  < > 106  --  104 103  --   CO2 22 20* 20* 18*  < > 27  --  29 25  --   GLUCOSE 91 121* 84 95  < >  100*  --  90 97  --   BUN 21* 21* 21* 22*  < > 8 13 7 8   --   CREATININE 6.91* 6.88* 6.95* 7.02*  < > 1.34* 1.2 1.04 1.03  --   CALCIUM 7.6* 6.9* 7.7* 7.7*  < > 8.9  --  9.2 9.1  --   MG  --   --  2.0 1.9  --   --   --   --   --  1.7  PHOS 4.4 3.9 4.5  --   --   --   --   --   --   --   < > = values in this interval not displayed. Liver Function Tests:  Recent Labs  12/14/14 0343 12/18/14 1239 12/22/14 0656 12/28/14 1156  AST 17 18 11* 13*  ALT 8* 10* 8* 6*  ALKPHOS 46 51 52 60  BILITOT 0.4 0.2*  --  0.5  PROT 6.6 7.2  --  7.0  ALBUMIN 2.3* 2.6*  --  2.6*   No results for input(s): LIPASE, AMYLASE in the last 8760 hours.  Recent Labs  11/27/14 1043  AMMONIA 26   CBC:  Recent Labs  12/18/14 1239 12/22/14 0656 12/28/14 1156 01/17/15 1501  WBC 5.8 9.3 10.7* 9.5  NEUTROABS 2.4  --  7.1 6.8  HGB 9.8* 9.1* 9.5* 9.4*  HCT 29.9* 28* 28.6* 29.1*  MCV 94.0  --  91.7 88.2  PLT 405* 380 388 476*   TSH:  Recent Labs  11/27/14 1842  TSH 1.420   A1C: Lab Results  Component Value Date   HGBA1C 5.0 11/27/2014   Lipid Panel: No results for input(s): CHOL, HDL, LDLCALC, TRIG, CHOLHDL, LDLDIRECT in the last 8760 hours.  Radiological Exams: Dg Chest Port 1 View  01/17/2015  CLINICAL DATA:  Acute seizure and hypotension. EXAM: PORTABLE CHEST 1 VIEW COMPARISON:  11/27/2014 and prior chest radiographs FINDINGS: The  cardiomediastinal silhouette is unremarkable. There is no evidence of focal airspace disease, pulmonary edema, suspicious pulmonary nodule/mass, pleural effusion, or pneumothorax. No acute bony abnormalities are identified. IMPRESSION: No active disease. Electronically Signed   By: Margarette Canada M.D.   On: 01/17/2015 15:09     Assessment/Plan  1. Gangrene of toe-left second toe S/p amputation, Poor healing wound- no signs of infection noted -following with vein and vascular as well as Dr Sharol Given.  -conts on nitro patch 0.2 mg to left ankle daily -follow up with Sharol Given and Vascular scheduled -pain controlled on current regimen  2. Anemia, chronic disease hgb stable at 9.1  3. Essential hypertension Blood pressure stable, conts on metoprolol 12.5 mg BID  4. Tobacco abuse -to cont Nicotine patch outpatient  5. Alcohol abuse -to cont MVI, folic acid and T24 vitamin  6. PVD -conts on plavic 75 mg daily -following with Dr Oneida Alar, vascular, plan to repeat dplex exam and ABIs on follow up.   pt is stable for discharge-will need PT/OT/Nursing per home health. DME needed includes rolling walker and 3N1. Rx written.  will need to follow up with PCP within 2 weeks.    Leon Foster. Harle Battiest  [facility] [phone] 8 am - 5 pm) [phone] (after hours)

## 2015-02-17 ENCOUNTER — Encounter: Payer: Medicare Other | Admitting: Urgent Care

## 2015-02-17 NOTE — Progress Notes (Signed)
This encounter was created in error - please disregard.

## 2015-03-29 ENCOUNTER — Non-Acute Institutional Stay (SKILLED_NURSING_FACILITY): Payer: Medicare Other | Admitting: Internal Medicine

## 2015-03-29 ENCOUNTER — Encounter: Payer: Self-pay | Admitting: Internal Medicine

## 2015-03-29 DIAGNOSIS — I272 Other secondary pulmonary hypertension: Secondary | ICD-10-CM

## 2015-03-29 DIAGNOSIS — F1010 Alcohol abuse, uncomplicated: Secondary | ICD-10-CM | POA: Diagnosis not present

## 2015-03-29 DIAGNOSIS — I739 Peripheral vascular disease, unspecified: Secondary | ICD-10-CM | POA: Diagnosis not present

## 2015-03-29 DIAGNOSIS — Z89432 Acquired absence of left foot: Secondary | ICD-10-CM

## 2015-03-29 DIAGNOSIS — I10 Essential (primary) hypertension: Secondary | ICD-10-CM

## 2015-03-29 DIAGNOSIS — I429 Cardiomyopathy, unspecified: Secondary | ICD-10-CM | POA: Diagnosis not present

## 2015-03-30 ENCOUNTER — Encounter: Payer: Self-pay | Admitting: Internal Medicine

## 2015-03-30 DIAGNOSIS — Z89432 Acquired absence of left foot: Secondary | ICD-10-CM | POA: Insufficient documentation

## 2015-03-30 NOTE — Progress Notes (Signed)
Patient ID: Leon Foster, male   DOB: Feb 22, 1941, 74 y.o.   MRN: [id number]    DATE: 03/29/15  Location:  [facility]    Place of Service: SNF (31)   Extended Emergency Contact Information Primary Emergency Contact: Leon Foster,Leon Foster Address: [address]          [place], [place] [address] [place] Phone: [phone] Relation: Sister  Advanced Directive information Does patient have an advance directive?: Yes, Type of Advance Directive: FULL CODE  Chief Complaint  Patient presents with  . Medical Management of Chronic Issues    HPI:  74 yo male long term resident seen today for f/u. He has no c/o today. No nursing issues. No falls. Appetite is ok.  PAD - he is s/p left TMA. He has had stents in the past. Takes plavix. Pain stable on roxicodone  pulm HTN - stable. PA pressure in 11/2014 was 6mHg. He is on nicoderm patch for tob abuse  Cardiomyopathy - last 2D echo in Aug 2016 revealed EF 55-60% with grade 1 diastolic dysfunction and nml systolic fxn and mild biatrial enlargement. nml LV sive. He takes metoprolol and plavix  Etoh abuse - takes thiamine, folate and B12 supplements  Past Medical History  Diagnosis Date  . Tobacco dependence   . 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 AVM (arteriovenous malformation) of colon     colonoscopy 2011 [facility]   . Cardiomyopathy ([facility] [place]     from recent hospitalization notes  . Acute renal failure ([facility] 11/2014    from recent hospitalization notes  . Gangrene of digit     left toe  . Confusion   . PAD (peripheral artery disease) ([facility]   . Leukocytosis   . Pulmonary hypertension ([facility]   . Seizures ([facility]      1 time seizure on 12/28/14 - seen in ED    Past Surgical History  Procedure Laterality Date  . Peripheral vascular catheterization N/A 12/14/2014    Procedure: Abdominal Aortogram;  Surgeon: CElam Dutch MD;  [facility];  Service: Cardiovascular;  Laterality: N/A;  .  Lower extremity angiogram Bilateral 12/14/2014    Procedure: Lower Extremity Angiogram;  Surgeon: CElam Dutch MD;  [facility];  Service: Cardiovascular;  Laterality: Bilateral;  . Peripheral vascular catheterization Left 12/14/2014    Procedure: Peripheral Vascular Intervention;  Surgeon: CElam Dutch MD;  [facility];  Service: Cardiovascular;  Laterality: Left;  SFA  . Amputation Left 01/01/2015    Procedure: Left Transmetatarsal Amputation;  Surgeon: MNewt Minion MD;  [facility]  Service: Orthopedics;  Laterality: Left;    Patient Care Team: ENolene Ebbs MD as PCP - General (Internal Medicine) TGlenford Bayley DO as Consulting Physician (Family Medicine)  Social History   Social History  . Marital Status: Widowed    Spouse Name: N/A  . Number of Children: N/A  . Years of Education: N/A   Occupational History  . Not on file.   Social History Main Topics  . Smoking status: Current Every Day Smoker -- 1.00 packs/day for 55 years    Types: Cigarettes  . Smokeless tobacco: Never Used     Comment: has not smoked since 11/27/2014  . Alcohol Use: 0.0 oz/week    0 Standard drinks or equivalent per week     Comment: 2-3 pints of wine daily  . Drug Use: No  . Sexual  Activity: Not on file   Other Topics Concern  . Not on file   Social History Narrative     reports that he has been smoking Cigarettes.  He has a 55 pack-year smoking history. He has never used smokeless tobacco. He reports that he drinks alcohol. He reports that he does not use illicit drugs.  Immunization History  Administered Date(s) Administered  . Tdap 03/14/2014    No Known Allergies  Medications: Patient's Medications  New Prescriptions   No medications on file  Previous Medications   ACETAMINOPHEN (TYLENOL) 325 MG TABLET    Take 650 mg by mouth every 6 (six) hours as needed for headache.   BISACODYL (DULCOLAX) 10 MG SUPPOSITORY    Place 10 mg rectally  daily as needed for mild constipation or moderate constipation (if not relieved by MOM).   CLOPIDOGREL (PLAVIX) 75 MG TABLET    Take 1 tablet (75 mg total) by mouth daily with breakfast.   FOLIC ACID (FOLVITE) 1 MG TABLET    Take 1 mg by mouth daily.   MAGNESIUM HYDROXIDE (MILK OF MAGNESIA) 400 MG/5ML SUSPENSION    Take 30 mLs by mouth daily as needed for mild constipation.   METOPROLOL TARTRATE (LOPRESSOR) 25 MG TABLET    Take 0.5 tablets (12.5 mg total) by mouth 2 (two) times daily.   MULTIPLE VITAMIN (MULTIVITAMIN WITH MINERALS) TABS TABLET    Take 1 tablet by mouth daily.   NICOTINE (NICODERM CQ - DOSED IN MG/24 HOURS) 21 MG/24HR PATCH    Place 1 patch (21 mg total) onto the skin daily.   NITROGLYCERIN (NITRODUR - DOSED IN MG/24 HR) 0.2 MG/HR PATCH    Place 0.2 mg onto the skin daily. 2 patches to left ankle daily   OXYCODONE (OXY IR/ROXICODONE) 5 MG IMMEDIATE RELEASE TABLET    Take 1 tablet (5 mg total) by mouth every 6 (six) hours as needed for moderate pain.   THIAMINE 100 MG TABLET    Take 1 tablet (100 mg total) by mouth daily.   VITAMIN B-12 (CYANOCOBALAMIN) 100 MCG TABLET    Take 100 mcg by mouth daily.  Modified Medications   No medications on file  Discontinued Medications   NUTRITIONAL SUPPLEMENTS (FEEDING SUPPLEMENT, NEPRO CARB STEADY,) LIQD    Take 237 mLs by mouth 3 (three) times daily with meals.   SODIUM PHOSPHATES (RA SALINE ENEMA) 19-7 GM/118ML ENEM    Place 1 enema rectally once as needed (if no relief by bisacodyl).    Review of Systems  Constitutional: Negative for chills, activity change and fatigue.  HENT: Negative for sore throat and trouble swallowing.   Eyes: Negative for visual disturbance.  Respiratory: Negative for cough, chest tightness and shortness of breath.   Cardiovascular: Negative for chest pain, palpitations and leg swelling.  Gastrointestinal: Negative for nausea, vomiting, abdominal pain and blood in stool.  Genitourinary: Negative for urgency,  frequency and difficulty urinating.  Musculoskeletal: Negative for arthralgias and gait problem.  Skin: Positive for wound (left tma site). Negative for rash.  Neurological: Negative for weakness and headaches.  Psychiatric/Behavioral: Negative for confusion and sleep disturbance. The patient is not nervous/anxious.     Filed Vitals:   03/29/15 0027  BP: 138/72  Pulse: 78  Temp: 98.6 F (37 C)  Weight: 138 lb 3.2 oz (62.687 kg)   Body mass index is 21.64 kg/(m^2).  Physical Exam  Constitutional: He is oriented to person, place, and time. He appears well-developed.  Frail appearing in NAD.  Lying in bed  HENT:  Mouth/Throat: Oropharynx is clear and moist.  Eyes: Pupils are equal, round, and reactive to light. No scleral icterus.  Neck: Neck supple. Carotid bruit is not present. No thyromegaly present.  Cardiovascular: Normal rate, regular rhythm and intact distal pulses.  Exam reveals no gallop and no friction rub.   Murmur (1/6 SEM) heard. no distal LE swelling. No calf TTP  Pulmonary/Chest: Effort normal and breath sounds normal. He has no wheezes. He has no rales. He exhibits no tenderness.  Abdominal: Soft. Bowel sounds are normal. He exhibits no distension, no abdominal bruit, no pulsatile midline mass and no mass. There is no tenderness. There is no rebound and no guarding.  Musculoskeletal:  Left TMA dressing c/d/i  Lymphadenopathy:    He has no cervical adenopathy.  Neurological: He is alert and oriented to person, place, and time.  Skin: Skin is warm and dry. No rash noted.  Psychiatric: He has a normal mood and affect. His behavior is normal. Thought content normal.     Labs reviewed: Nursing Home on 01/22/2015  Component Date Value Ref Range Status  . Hemoglobin 12/22/2014 9.1* 13.5 - 17.5 g/dL Final  . HCT 12/22/2014 28* 41 - 53 % Final  . Platelets 12/22/2014 380  150 - 399 K/L Final  . WBC 12/22/2014 9.3   Final  . Glucose 12/22/2014 84   Final  . BUN  12/22/2014 13  4 - 21 mg/dL Final  . Creatinine 12/22/2014 1.2  0.6 - 1.3 mg/dL Final  . Potassium 12/22/2014 3.6  3.4 - 5.3 mmol/L Final  . Sodium 12/22/2014 138  137 - 147 mmol/L Final  . Alkaline Phosphatase 12/22/2014 52  25 - 125 U/L Final  . ALT 12/22/2014 8* 10 - 40 U/L Final  . AST 12/22/2014 11* 14 - 40 U/L Final  . Bilirubin, Total 12/22/2014 0.2   Final  Admission on 01/17/2015, Discharged on 01/17/2015  Component Date Value Ref Range Status  . WBC 01/17/2015 9.5  4.0 - 10.5 K/uL Final  . RBC 01/17/2015 3.30* 4.22 - 5.81 MIL/uL Final  . Hemoglobin 01/17/2015 9.4* 13.0 - 17.0 g/dL Final  . HCT 01/17/2015 29.1* 39.0 - 52.0 % Final  . MCV 01/17/2015 88.2  78.0 - 100.0 fL Final  . MCH 01/17/2015 28.5  26.0 - 34.0 pg Final  . MCHC 01/17/2015 32.3  30.0 - 36.0 g/dL Final  . RDW 01/17/2015 16.5* 11.5 - 15.5 % Final  . Platelets 01/17/2015 476* 150 - 400 K/uL Final  . Neutrophils Relative % 01/17/2015 71   Final  . Neutro Abs 01/17/2015 6.8  1.7 - 7.7 K/uL Final  . Lymphocytes Relative 01/17/2015 19   Final  . Lymphs Abs 01/17/2015 1.8  0.7 - 4.0 K/uL Final  . Monocytes Relative 01/17/2015 7   Final  . Monocytes Absolute 01/17/2015 0.6  0.1 - 1.0 K/uL Final  . Eosinophils Relative 01/17/2015 2   Final  . Eosinophils Absolute 01/17/2015 0.2  0.0 - 0.7 K/uL Final  . Basophils Relative 01/17/2015 1   Final  . Basophils Absolute 01/17/2015 0.1  0.0 - 0.1 K/uL Final  . Sodium 01/17/2015 138  135 - 145 mmol/L Final  . Potassium 01/17/2015 4.3  3.5 - 5.1 mmol/L Final  . Chloride 01/17/2015 103  101 - 111 mmol/L Final  . CO2 01/17/2015 25  22 - 32 mmol/L Final  . Glucose, Bld 01/17/2015 97  65 - 99 mg/dL Final  . BUN 01/17/2015  8  6 - 20 mg/dL Final  . Creatinine, Ser 01/17/2015 1.03  0.61 - 1.24 mg/dL Final  . Calcium 01/17/2015 9.1  8.9 - 10.3 mg/dL Final  . GFR calc non Af Amer 01/17/2015 >60  >60 mL/min Final  . GFR calc Af Amer 01/17/2015 >60  >60 mL/min Final   Comment:  (NOTE) The eGFR has been calculated using the CKD EPI equation. This calculation has not been validated in all clinical situations. eGFR's persistently <60 mL/min signify possible Chronic Kidney Disease.   . Anion gap 01/17/2015 10  5 - 15 Final  . Color, Urine 01/17/2015 YELLOW  YELLOW Final  . APPearance 01/17/2015 CLEAR  CLEAR Final  . Specific Gravity, Urine 01/17/2015 1.015  1.005 - 1.030 Final  . pH 01/17/2015 6.0  5.0 - 8.0 Final  . Glucose, UA 01/17/2015 NEGATIVE  NEGATIVE mg/dL Final  . Hgb urine dipstick 01/17/2015 NEGATIVE  NEGATIVE Final  . Bilirubin Urine 01/17/2015 NEGATIVE  NEGATIVE Final  . Ketones, ur 01/17/2015 NEGATIVE  NEGATIVE mg/dL Final  . Protein, ur 01/17/2015 NEGATIVE  NEGATIVE mg/dL Final  . Urobilinogen, UA 01/17/2015 0.2  0.0 - 1.0 mg/dL Final  . Nitrite 01/17/2015 NEGATIVE  NEGATIVE Final  . Leukocytes, UA 01/17/2015 NEGATIVE  NEGATIVE Final   MICROSCOPIC NOT DONE ON URINES WITH NEGATIVE PROTEIN, BLOOD, LEUKOCYTES, NITRITE, OR GLUCOSE <1000 mg/dL.  . Troponin i, poc 01/17/2015 0.00  0.00 - 0.08 ng/mL Final  . Comment 3 01/17/2015          Final   Comment: Due to the release kinetics of cTnI, a negative result within the first hours of the onset of symptoms does not rule out myocardial infarction with certainty. If myocardial infarction is still suspected, repeat the test at appropriate intervals.   . Lactic Acid, Venous 01/17/2015 1.36  0.5 - 2.0 mmol/L Final  . Magnesium 01/17/2015 1.7  1.7 - 2.4 mg/dL Final  Admission on 01/01/2015, Discharged on 01/01/2015  Component Date Value Ref Range Status  . aPTT 01/01/2015 31  24 - 37 seconds Final    No results found.   Assessment/Plan   ICD-9-CM ICD-10-CM   1. Essential hypertension - stable 401.9 I10   2. Pulmonary hypertension (HCC) - stable 416.8 I27.2   3. PAD (peripheral artery disease) (HCC) - stable; s/p left TMA 443.9 I73.9   4. Cardiomyopathy ([place]) - stable 425.4 I42.9   5. Status post  transmetatarsal amputation of left foot ([place]) due to #3 V49.73 Z89.432   6. Alcohol abuse hx 305.00 F10.10     Pt is medically stable on current tx plan. Continue current medications as ordered. PT/OT/ST as indicated. Will follow  Will follow  Christopher Hink S. Perlie Gold  [facility] [address] [place], [place] [address] [phone] Cell (Monday-Friday 8 AM - 5 PM) [phone] After 5 PM and follow prompts

## 2015-04-23 ENCOUNTER — Encounter: Payer: Self-pay | Admitting: Vascular Surgery

## 2015-04-29 ENCOUNTER — Other Ambulatory Visit: Payer: Self-pay | Admitting: Vascular Surgery

## 2015-04-29 ENCOUNTER — Ambulatory Visit (HOSPITAL_COMMUNITY)
Admission: RE | Admit: 2015-04-29 | Discharge: 2015-04-29 | Disposition: A | Discharge status: Home / Self Care | Payer: Medicare Other | Source: Ambulatory Visit | Attending: Vascular Surgery | Admitting: Vascular Surgery

## 2015-04-29 ENCOUNTER — Encounter: Payer: Self-pay | Admitting: Vascular Surgery

## 2015-04-29 ENCOUNTER — Ambulatory Visit (INDEPENDENT_AMBULATORY_CARE_PROVIDER_SITE_OTHER): Payer: Medicare Other | Admitting: Vascular Surgery

## 2015-04-29 ENCOUNTER — Ambulatory Visit (INDEPENDENT_AMBULATORY_CARE_PROVIDER_SITE_OTHER)
Admission: RE | Admit: 2015-04-29 | Discharge: 2015-04-29 | Disposition: A | Discharge status: Home / Self Care | Payer: Medicare Other | Source: Ambulatory Visit | Attending: Vascular Surgery | Admitting: Vascular Surgery

## 2015-04-29 VITALS — BP 115/73 | HR 71 | Ht 67.0 in | Wt 143.0 lb

## 2015-04-29 DIAGNOSIS — I739 Peripheral vascular disease, unspecified: Secondary | ICD-10-CM

## 2015-04-29 NOTE — Progress Notes (Signed)
Patient is 75 year old male who returns for follow-up today. He underwent a left superficial femoral artery stenting procedure on 12/14/2014. He reports no pain in left foot. He subsequently underwent a transmetatarsal amputation of the left foot by Dr. Sharol Given. This has now completely healed. The patient is currently on Plavix. He is currently residing in a skilled nursing facility. He is now walking.    Current Outpatient Prescriptions on File Prior to Visit   Medication  Sig  Dispense  Refill   .  acetaminophen (TYLENOL) 325 MG tablet  Take 650 mg by mouth every 6 (six) hours as needed for headache.       .  bisacodyl (DULCOLAX) 10 MG suppository  Place 10 mg rectally daily as needed for mild constipation or moderate constipation (if not relieved by MOM).       .  clopidogrel (PLAVIX) 75 MG tablet  Take 1 tablet (75 mg total) by mouth daily with breakfast.       .  magnesium hydroxide (MILK OF MAGNESIA) 400 MG/5ML suspension  Take 30 mLs by mouth daily as needed for mild constipation.       .  metoprolol tartrate (LOPRESSOR) 25 MG tablet  Take 0.5 tablets (12.5 mg total) by mouth 2 (two) times daily.       .  Multiple Vitamin (MULTIVITAMIN WITH MINERALS) TABS tablet  Take 1 tablet by mouth daily.       .  nicotine (NICODERM CQ - DOSED IN MG/24 HOURS) 21 mg/24hr patch  Place 1 patch (21 mg total) onto the skin daily.  28 patch  0   .  Nutritional Supplements (FEEDING SUPPLEMENT, NEPRO CARB STEADY,) LIQD  Take 237 mLs by mouth 3 (three) times daily with meals.    0   .  oxyCODONE (OXY IR/ROXICODONE) 5 MG immediate release tablet  Take 1 tablet (5 mg total) by mouth every 6 (six) hours as needed for moderate pain.  30 tablet  0   .  Sodium Phosphates (RA SALINE ENEMA) 19-7 GM/118ML ENEM  Place 1 enema rectally once as needed (if no relief by bisacodyl).       .  thiamine 100 MG tablet  Take 1 tablet (100 mg total) by mouth daily.          No current facility-administered medications on file prior to  visit.    Review of systems: He denies shortness of breath. He denies chest pain.  Physical exam:  Filed Vitals:   04/29/15 1137  BP: 115/73  Pulse: 71  Height: 5\' 7"  (1.702 m)  Weight: 143 lb (64.864 kg)  SpO2: 99%    Extremities: 2+ femoral pulses bilaterally absent popliteal and pedal pulses, healed transmetatarsal amputation  Data: Patient had bilateral ABIs performed today which were 0.86 on the left 0.80 on the right duplex ultrasound of the patient's left SFA stents there is this is widely patent. He has one-vessel runoff via the peroneal artery.  Assessment:  The patient's left superficial femoral artery stent is widely patent with one-vessel runoff via the peroneal artery.  Plan: Patient will follow-up with me in 6 months for repeat duplex exam and ABIs. He was given a prescription today for a shoe insert for his transmetatarsal amputation.  Ruta Hinds, MD [facility] Office: [phone] Pager: [phone]

## 2015-05-06 ENCOUNTER — Non-Acute Institutional Stay (SKILLED_NURSING_FACILITY): Payer: Medicare Other | Admitting: Internal Medicine

## 2015-05-06 ENCOUNTER — Encounter: Payer: Self-pay | Admitting: Internal Medicine

## 2015-05-06 DIAGNOSIS — I272 Other secondary pulmonary hypertension: Secondary | ICD-10-CM | POA: Diagnosis not present

## 2015-05-06 DIAGNOSIS — Z89432 Acquired absence of left foot: Secondary | ICD-10-CM | POA: Diagnosis not present

## 2015-05-06 DIAGNOSIS — F1010 Alcohol abuse, uncomplicated: Secondary | ICD-10-CM

## 2015-05-06 DIAGNOSIS — I429 Cardiomyopathy, unspecified: Secondary | ICD-10-CM

## 2015-05-06 DIAGNOSIS — I739 Peripheral vascular disease, unspecified: Secondary | ICD-10-CM

## 2015-05-06 DIAGNOSIS — I10 Essential (primary) hypertension: Secondary | ICD-10-CM

## 2015-05-06 NOTE — Progress Notes (Signed)
Patient ID: Leon Foster, male   DOB: 1940/05/15, 75 y.o.   MRN: [id number]    DATE: 05/06/15  Location:  [facility]    Place of Service: SNF (31)   Extended Emergency Contact Information Primary Emergency Contact: Whitsett,Margaret Address: [address]          [place], [place] [address] [place] Phone: [phone] Relation: Sister  Advanced Directive information   FULL CODE  Chief Complaint  Patient presents with  . Medical Management of Chronic Issues    [facility]    HPI:  75 yo male long term resident seen today for f/u. He has no concerns today. No nursing issues. He is a poor historian due to memory loss associated with Etoh abuse. Hx obtained from chart  PAD - he is s/p left TMA. Jan 2017 ABIs left 0.86 (prev 1.1) and right 0.80 (prev 0.93). He has had stents in the past. Takes plavix. Pain stable on roxicodone  pulm HTN - stable. PA pressure in 11/2014 was 58mmHg. He is on nicoderm patch for tob abuse  Cardiomyopathy - last 2D echo in Aug 2016 revealed EF 55-60% with grade 1 diastolic dysfunction and nml systolic fxn and mild biatrial enlargement. nml LV sive. He takes metoprolol and plavix  Etoh abuse - takes thiamine, folate and B12 supplementts  Past Medical History  Diagnosis Date  . Tobacco dependence   . Alcoholic ([place])   . AVM (arteriovenous malformation) of colon     colonoscopy 2011 [facility]   . Cardiomyopathy ([place])     from recent hospitalization notes  . Acute renal failure ([place]) 11/2014    from recent hospitalization notes  . Gangrene of digit     left toe  . Confusion   . PAD (peripheral artery disease) ([place])   . Leukocytosis   . Pulmonary hypertension ([place])   . Seizures ([place])      1 time seizure on 12/28/14 - seen in ED    Past Surgical History  Procedure Laterality Date  . Peripheral vascular catheterization N/A 12/14/2014    Procedure: Abdominal Aortogram;  Surgeon: Elam Dutch, MD;   Location: [facility];  Service: Cardiovascular;  Laterality: N/A;  . Lower extremity angiogram Bilateral 12/14/2014    Procedure: Lower Extremity Angiogram;  Surgeon: Elam Dutch, MD;  Location: [facility];  Service: Cardiovascular;  Laterality: Bilateral;  . Peripheral vascular catheterization Left 12/14/2014    Procedure: Peripheral Vascular Intervention;  Surgeon: Elam Dutch, MD;  Location: [facility];  Service: Cardiovascular;  Laterality: Left;  SFA  . Amputation Left 01/01/2015    Procedure: Left Transmetatarsal Amputation;  Surgeon: Newt Minion, MD;  Location: [facility];  Service: Orthopedics;  Laterality: Left;    Patient Care Team: Nolene Ebbs, MD as PCP - General (Internal Medicine) Glenford Bayley, DO as Consulting Physician (Family Medicine)  Social History   Social History  . Marital Status: Widowed    Spouse Name: N/A  . Number of Children: N/A  . Years of Education: N/A   Occupational History  . Not on file.   Social History Main Topics  . Smoking status: Current Every Day Smoker -- 1.00 packs/day for 55 years    Types: Cigarettes  . Smokeless tobacco: Never Used     Comment: has not smoked since 11/27/2014  . Alcohol Use: 0.0 oz/week    0 Standard drinks or equivalent per  week     Comment: 2-3 pints of wine daily  . Drug Use: No  . Sexual Activity: Not on file   Other Topics Concern  . Not on file   Social History Narrative     reports that he has been smoking Cigarettes.  He has a 55 pack-year smoking history. He has never used smokeless tobacco. He reports that he drinks alcohol. He reports that he does not use illicit drugs.  Immunization History  Administered Date(s) Administered  . Tdap 03/14/2014    No Known Allergies  Medications: Patient's Medications  New Prescriptions   No medications on file  Previous Medications   ACETAMINOPHEN (TYLENOL) 325 MG TABLET    Take 650 mg by mouth every 6 (six) hours as needed for  headache.   BISACODYL (DULCOLAX) 10 MG SUPPOSITORY    Place 10 mg rectally daily as needed for mild constipation or moderate constipation (if not relieved by MOM).   CLOPIDOGREL (PLAVIX) 75 MG TABLET    Take 1 tablet (75 mg total) by mouth daily with breakfast.   FOLIC ACID (FOLVITE) 1 MG TABLET    Take 1 mg by mouth daily.   MAGNESIUM HYDROXIDE (MILK OF MAGNESIA) 400 MG/5ML SUSPENSION    Take 30 mLs by mouth daily as needed for mild constipation.   METOPROLOL TARTRATE (LOPRESSOR) 25 MG TABLET    Take 0.5 tablets (12.5 mg total) by mouth 2 (two) times daily.   MULTIPLE VITAMIN (MULTIVITAMIN WITH MINERALS) TABS TABLET    Take 1 tablet by mouth daily.   NICOTINE (NICODERM CQ - DOSED IN MG/24 HOURS) 21 MG/24HR PATCH    Place 1 patch (21 mg total) onto the skin daily.   NITROGLYCERIN (NITRODUR - DOSED IN MG/24 HR) 0.2 MG/HR PATCH    Place 0.2 mg onto the skin daily. 2 patches to left ankle daily   OXYCODONE (OXY IR/ROXICODONE) 5 MG IMMEDIATE RELEASE TABLET    Take 1 tablet (5 mg total) by mouth every 6 (six) hours as needed for moderate pain.   THIAMINE 100 MG TABLET    Take 1 tablet (100 mg total) by mouth daily.   VITAMIN B-12 (CYANOCOBALAMIN) 100 MCG TABLET    Take 100 mcg by mouth daily.  Modified Medications   No medications on file  Discontinued Medications   No medications on file    Review of Systems  Unable to perform ROS: Other  memory loss  Filed Vitals:   05/06/15 1709  BP: 119/76  Pulse: 92  Temp: 97.8 F (36.6 C)  SpO2: 97%   There is no weight on file to calculate BMI.  Physical Exam  Constitutional: He appears well-developed.  Lying in bed resting in NAD, frail appearing  HENT:  Mouth/Throat: Oropharynx is clear and moist.  Eyes: Pupils are equal, round, and reactive to light. No scleral icterus.  Neck: Neck supple. Carotid bruit is not present. No thyromegaly present.  Cardiovascular: Normal rate, regular rhythm and intact distal pulses.  Exam reveals no gallop and  no friction rub.   Murmur (1/6 SEM) heard. no distal LE swelling. No calf TTP  Pulmonary/Chest: Effort normal and breath sounds normal. He has no wheezes. He has no rales. He exhibits no tenderness.  Abdominal: Soft. Bowel sounds are normal. He exhibits no distension, no abdominal bruit, no pulsatile midline mass and no mass. There is no tenderness. There is no rebound and no guarding.  Musculoskeletal:  Left TMA  Lymphadenopathy:    He has no  cervical adenopathy.  Neurological: He is alert.  Skin: Skin is warm and dry. No rash noted.  Psychiatric: He has a normal mood and affect. His behavior is normal.     Labs reviewed: No visits with results within 3 Month(s) from this visit. Latest known visit with results is:  Nursing Home on 01/22/2015  Component Date Value Ref Range Status  . Hemoglobin 12/22/2014 9.1* 13.5 - 17.5 g/dL Final  . HCT 12/22/2014 28* 41 - 53 % Final  . Platelets 12/22/2014 380  150 - 399 K/L Final  . WBC 12/22/2014 9.3   Final  . Glucose 12/22/2014 84   Final  . BUN 12/22/2014 13  4 - 21 mg/dL Final  . Creatinine 12/22/2014 1.2  0.6 - 1.3 mg/dL Final  . Potassium 12/22/2014 3.6  3.4 - 5.3 mmol/L Final  . Sodium 12/22/2014 138  137 - 147 mmol/L Final  . Alkaline Phosphatase 12/22/2014 52  25 - 125 U/L Final  . ALT 12/22/2014 8* 10 - 40 U/L Final  . AST 12/22/2014 11* 14 - 40 U/L Final  . Bilirubin, Total 12/22/2014 0.2   Final    No results found.   Assessment/Plan   ICD-9-CM ICD-10-CM   1. Essential hypertension 401.9 I10   2. PAD (peripheral artery disease) (HCC) 443.9 I73.9   3. Status post transmetatarsal amputation of left foot (HCC) V49.73 Z89.432   4. Alcohol abuse 305.00 F10.10   5. Pulmonary hypertension (HCC) 416.8 I27.2   6. Cardiomyopathy ([place]) 425.4 I42.9    OPTUM provider to follow  Pt is medically stable on current tx plan. Continue current medications as ordered. PT/OT/ST as indicated. Will follow  Kathalene Sporer S. Perlie Gold  [facility] [address] [place], [place] [address] [phone] Cell (Monday-Friday 8 AM - 5 PM) [phone] After 5 PM and follow prompts

## 2015-05-19 ENCOUNTER — Ambulatory Visit (INDEPENDENT_AMBULATORY_CARE_PROVIDER_SITE_OTHER): Payer: Medicare Other | Admitting: Podiatry

## 2015-05-19 ENCOUNTER — Encounter: Payer: Self-pay | Admitting: Podiatry

## 2015-05-19 VITALS — BP 116/78 | HR 72 | Resp 12

## 2015-05-19 DIAGNOSIS — B351 Tinea unguium: Secondary | ICD-10-CM | POA: Diagnosis not present

## 2015-05-19 DIAGNOSIS — M79674 Pain in right toe(s): Secondary | ICD-10-CM | POA: Diagnosis not present

## 2015-05-19 NOTE — Progress Notes (Signed)
   Subjective:    Patient ID: Leon Foster, male    DOB: 1940/07/20, 75 y.o.   MRN: [id number]  HPI    This patient presents today complaining of thickened and elongated toenails on the right foot which have gradually become thicker and longer more comfortable over time. Patient does not recall any recent podiatric care at thickness of the toenails are all comfortable patient attempts to wear a close shoe walks. Patient's sisters present treatment room and helps patient respond to questioning. Patient's sister also requesting general foot examination  Patient has history transmetatarsal amputation left      Review of Systems  Musculoskeletal: Positive for gait problem.  Skin: Positive for color change.       Objective:   Physical Exam  Patient has difficulty responding to questioning with [facility] being patient to respond  Vascular: DP pulses 0/4 bilaterally PT pulses 0/4 bilaterally  Neurological: Sensation to 10 g monofilament wire intact 5/5 bilaterally Vibratory sensation nonreactive bilaterally Ankle reflex equal and reactive bilaterally  Dermatological: Open skin lesions bilaterally Toenails 1-5 are elongated, hypertrophic, deformed and tender to direct palpation  Musculoskeletal: Transmetatarsal amputation left foot well-healed incision site HAV right Hammertoe second right Ankle motion 90, bilaterally      Assessment & Plan:   Assessment: Absent pedal pulses consistent with known history of peripheral arterial disease Transmetatarsal amputation left Symptomatic onychomycosis 1-5 right HAV right Hammertoe second right  Plan: Today review the results examination with patient sister and treatment room. I made aware that circulation was very limited in his right foot recommended toenails debrided professionally. Patient consents  The toenails 1-5 right are debrided mechanically and electronically without any bleeding  Reappoint 3 months

## 2015-05-19 NOTE — Patient Instructions (Signed)
Diabetes and Foot Care Diabetes may cause you to have problems because of poor blood supply (circulation) to your feet and legs. This may cause the skin on your feet to become thinner, break easier, and heal more slowly. Your skin may become dry, and the skin may peel and crack. You may also have nerve damage in your legs and feet causing decreased feeling in them. You may not notice minor injuries to your feet that could lead to infections or more serious problems. Taking care of your feet is one of the most important things you can do for yourself.  HOME CARE INSTRUCTIONS  Wear shoes at all times, even in the house. Do not go barefoot. Bare feet are easily injured.  Check your feet daily for blisters, cuts, and redness. If you cannot see the bottom of your feet, use a mirror or ask someone for help.  Wash your feet with warm water (do not use hot water) and mild soap. Then pat your feet and the areas between your toes until they are completely dry. Do not soak your feet as this can dry your skin.  Apply a moisturizing lotion or petroleum jelly (that does not contain alcohol and is unscented) to the skin on your feet and to dry, brittle toenails. Do not apply lotion between your toes.  Trim your toenails straight across. Do not dig under them or around the cuticle. File the edges of your nails with an emery board or nail file.  Do not cut corns or calluses or try to remove them with medicine.  Wear clean socks or stockings every day. Make sure they are not too tight. Do not wear knee-high stockings since they may decrease blood flow to your legs.  Wear shoes that fit properly and have enough cushioning. To break in new shoes, wear them for just a few hours a day. This prevents you from injuring your feet. Always look in your shoes before you put them on to be sure there are no objects inside.  Do not cross your legs. This may decrease the blood flow to your feet.  If you find a minor scrape,  cut, or break in the skin on your feet, keep it and the skin around it clean and dry. These areas may be cleansed with mild soap and water. Do not cleanse the area with peroxide, alcohol, or iodine.  When you remove an adhesive bandage, be sure not to damage the skin around it.  If you have a wound, look at it several times a day to make sure it is healing.  Do not use heating pads or hot water bottles. They may burn your skin. If you have lost feeling in your feet or legs, you may not know it is happening until it is too late.  Make sure your health care provider performs a complete foot exam at least annually or more often if you have foot problems. Report any cuts, sores, or bruises to your health care provider immediately. SEEK MEDICAL CARE IF:   You have an injury that is not healing.  You have cuts or breaks in the skin.  You have an ingrown nail.  You notice redness on your legs or feet.  You feel burning or tingling in your legs or feet.  You have pain or cramps in your legs and feet.  Your legs or feet are numb.  Your feet always feel cold. SEEK IMMEDIATE MEDICAL CARE IF:   There is increasing redness,   swelling, or pain in or around a wound.  There is a red line that goes up your leg.  Pus is coming from a wound.  You develop a fever or as directed by your health care provider.  You notice a bad smell coming from an ulcer or wound.   This information is not intended to replace advice given to you by your health care provider. Make sure you discuss any questions you have with your health care provider.   Document Released: 03/31/2000 Document Revised: 12/04/2012 Document Reviewed: 09/10/2012 Elsevier Interactive Patient Education 2016 Elsevier Inc.  

## 2015-06-09 ENCOUNTER — Non-Acute Institutional Stay (SKILLED_NURSING_FACILITY): Payer: Medicare Other | Admitting: Nurse Practitioner

## 2015-06-09 ENCOUNTER — Encounter: Payer: Self-pay | Admitting: Nurse Practitioner

## 2015-06-09 DIAGNOSIS — Z720 Tobacco use: Secondary | ICD-10-CM

## 2015-06-09 DIAGNOSIS — K089 Disorder of teeth and supporting structures, unspecified: Secondary | ICD-10-CM | POA: Diagnosis not present

## 2015-06-09 DIAGNOSIS — F1010 Alcohol abuse, uncomplicated: Secondary | ICD-10-CM | POA: Diagnosis not present

## 2015-06-09 DIAGNOSIS — D638 Anemia in other chronic diseases classified elsewhere: Secondary | ICD-10-CM

## 2015-06-09 DIAGNOSIS — F1011 Alcohol abuse, in remission: Secondary | ICD-10-CM

## 2015-06-09 DIAGNOSIS — I739 Peripheral vascular disease, unspecified: Secondary | ICD-10-CM

## 2015-06-09 DIAGNOSIS — I10 Essential (primary) hypertension: Secondary | ICD-10-CM

## 2015-06-09 NOTE — Progress Notes (Signed)
Patient ID: Leon Foster, male   DOB: Sep 14, 1940, 75 y.o.   MRN: [id number]    Nursing Home Location:  [facility]   Place of Service: SNF (31)  PCP: Philis Fendt, MD  No Known Allergies  Chief Complaint  Patient presents with  . Medical Management of Chronic Issues    Routine Visit    HPI:  Patient is a 75 y.o. male seen today at [facility] medical management of his chronic issues. 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 Pt has been doing well in the last month. no acute issues. Pt is walking with restorative.  No issues of pain. Reports good appetite. No issues with bowel or bladder. Pt without complaints. Staff has no concerns at this time.   Review of Systems:  Review of Systems  Constitutional: Negative for fever, chills and fatigue.  HENT: Negative for congestion, postnasal drip, rhinorrhea and sore throat.   Respiratory: Negative for cough, shortness of breath and wheezing.   Cardiovascular: Negative for chest pain, palpitations and leg swelling.  Gastrointestinal: Negative for abdominal pain, diarrhea and constipation.  Genitourinary: Negative for dysuria, urgency and frequency.  Musculoskeletal: Negative for myalgias and arthralgias.  Skin: Negative.   Neurological: Negative for dizziness, light-headedness and headaches.  Psychiatric/Behavioral: Negative for self-injury and agitation. The patient is not nervous/anxious.     Past Medical History  Diagnosis Date  . Tobacco dependence   . Alcoholic ([place])   . AVM (arteriovenous malformation) of colon     colonoscopy 2011 [facility]   . Cardiomyopathy ([place])     from recent hospitalization notes  . Acute renal failure ([place]) 11/2014    from recent hospitalization notes  . Gangrene of digit     left toe  . Confusion   . PAD (peripheral artery disease) ([place])   . Leukocytosis   . Pulmonary  hypertension ([place])   . Seizures ([place])      1 time seizure on 12/28/14 - seen in ED   Past Surgical History  Procedure Laterality Date  . Peripheral vascular catheterization N/A 12/14/2014    Procedure: Abdominal Aortogram;  Surgeon: Elam Dutch, MD;  Location: [place] [facility];  Service: Cardiovascular;  Laterality: N/A;  . Lower extremity angiogram Bilateral 12/14/2014    Procedure: Lower Extremity Angiogram;  Surgeon: Elam Dutch, MD;  Location: [place] [facility];  Service: Cardiovascular;  Laterality: Bilateral;  . Peripheral vascular catheterization Left 12/14/2014    Procedure: Peripheral Vascular Intervention;  Surgeon: Elam Dutch, MD;  Location: [place] [facility];  Service: Cardiovascular;  Laterality: Left;  SFA  . Amputation Left 01/01/2015    Procedure: Left Transmetatarsal Amputation;  Surgeon: Newt Minion, MD;  Location: [place];  Service: Orthopedics;  Laterality: Left;   Social History:   reports that he has been smoking Cigarettes.  He has a 55 pack-year smoking history. He has never used smokeless tobacco. He reports that he drinks alcohol. He reports that he does not use illicit drugs.  Family History  Problem Relation Age of Onset  . Stroke Sister   . Deep vein thrombosis Sister   . Cancer Brother   . Diabetes Brother   . Hyperlipidemia Brother   . Hypertension Brother   . Heart disease Father   . Hypertension Sister     Medications: Patient's Medications  New  Prescriptions   No medications on file  Previous Medications   ACETAMINOPHEN (TYLENOL) 325 MG TABLET    Take 650 mg by mouth every 6 (six) hours as needed for headache.   BISACODYL (DULCOLAX) 10 MG SUPPOSITORY    Place 10 mg rectally daily as needed for mild constipation or moderate constipation (if not relieved by MOM).   CLOPIDOGREL (PLAVIX) 75 MG TABLET    Take 1 tablet (75 mg total) by mouth daily with breakfast.   FOLIC ACID (FOLVITE) 1 MG TABLET    Take 1 mg by mouth daily.    MAGNESIUM HYDROXIDE (MILK OF MAGNESIA) 400 MG/5ML SUSPENSION    Take 30 mLs by mouth daily as needed for mild constipation.   METOPROLOL TARTRATE (LOPRESSOR) 25 MG TABLET    Take 0.5 tablets (12.5 mg total) by mouth 2 (two) times daily.   MULTIPLE VITAMIN (MULTIVITAMIN WITH MINERALS) TABS TABLET    Take 1 tablet by mouth daily.   NICOTINE (NICODERM CQ - DOSED IN MG/24 HOURS) 21 MG/24HR PATCH    Place 1 patch (21 mg total) onto the skin daily.   OXYCODONE (OXY IR/ROXICODONE) 5 MG IMMEDIATE RELEASE TABLET    Take 1 tablet (5 mg total) by mouth every 6 (six) hours as needed for moderate pain.   THIAMINE 100 MG TABLET    Take 1 tablet (100 mg total) by mouth daily.   VITAMIN B-12 (CYANOCOBALAMIN) 500 MCG TABLET    Take 500 mcg by mouth daily.  Modified Medications   No medications on file  Discontinued Medications   No medications on file     Physical Exam: Filed Vitals:   06/09/15 1045  BP: 138/72  Pulse: 78  Temp: 97.6 F (36.4 C)  TempSrc: Oral  Resp: 20  Height: 5\' 7"  (1.702 m)  Weight: 142 lb 3.2 oz (64.501 kg)    Physical Exam  Constitutional: No distress.  Frail elderly male   HENT:  Head: Normocephalic and atraumatic.  Poor detention   Eyes: Pupils are equal, round, and reactive to light.  Neck: Normal range of motion. Neck supple.  Cardiovascular: Normal rate, regular rhythm and normal heart sounds.   Pulmonary/Chest: Effort normal and breath sounds normal.  Abdominal: Soft. Bowel sounds are normal.  Musculoskeletal: He exhibits no edema or tenderness.  Neurological: He is alert.  Skin: Skin is warm and dry. He is not diaphoretic.    Labs reviewed: Basic Metabolic Panel:  Recent Labs  11/28/14 0249 11/28/14 1529 11/29/14 0329 11/30/14 0305  12/18/14 1239  12/22/14 0656 12/28/14 1156 01/17/15 1501 01/17/15 1504  NA 138 140 139 141  < > 139  < > 138 140 138  --   K 3.4* 3.3* 4.2 3.7  < > 4.2  < > 3.6 4.0 4.3  --   CL 105 108 109 114*  < > 106  --   --   104 103  --   CO2 22 20* 20* 18*  < > 27  --   --  29 25  --   GLUCOSE 91 121* 84 95  < > 100*  --   --  90 97  --   BUN 21* 21* 21* 22*  < > 8  < > 13 7 8   --   CREATININE 6.91* 6.88* 6.95* 7.02*  < > 1.34*  < > 1.2 1.04 1.03  --   CALCIUM 7.6* 6.9* 7.7* 7.7*  < > 8.9  --   --  9.2  9.1  --   MG  --   --  2.0 1.9  --   --   --   --   --   --  1.7  PHOS 4.4 3.9 4.5  --   --   --   --   --   --   --   --   < > = values in this interval not displayed. Liver Function Tests:  Recent Labs  12/14/14 0343 12/18/14 1239 12/22/14 12/22/14 0656 12/28/14 1156  AST 17 18 11* 11* 13*  ALT 8* 10* 8* 8* 6*  ALKPHOS 46 51 52 52 60  BILITOT 0.4 0.2*  --   --  0.5  PROT 6.6 7.2  --   --  7.0  ALBUMIN 2.3* 2.6*  --   --  2.6*   No results for input(s): LIPASE, AMYLASE in the last 8760 hours.  Recent Labs  11/27/14 1043  AMMONIA 26   CBC:  Recent Labs  12/18/14 1239  12/22/14 0656 12/28/14 1156 01/17/15 1501  WBC 5.8  < > 9.3 10.7* 9.5  NEUTROABS 2.4  --   --  7.1 6.8  HGB 9.8*  < > 9.1* 9.5* 9.4*  HCT 29.9*  < > 28* 28.6* 29.1*  MCV 94.0  --   --  91.7 88.2  PLT 405*  < > 380 388 476*  < > = values in this interval not displayed. TSH:  Recent Labs  11/27/14 1842  TSH 1.420   A1C: Lab Results  Component Value Date   HGBA1C 5.0 11/27/2014   Lipid Panel: No results for input(s): CHOL, HDL, LDLCALC, TRIG, CHOLHDL, LDLDIRECT in the last 8760 hours.  Radiological Exams: No results found.   Assessment/Plan 1. Essential hypertension -blood pressure stable, conts on lopressor 25 mg BID -will follow up cbc, bmp and lipid panel  2. PAD (peripheral artery disease) (HCC) -stable, conts on plavix daily  3. Anemia, chronic disease Will folllow up CBC at this time  4. H/O ETOH abuse -not drinking at this time. conts on MVI, folic acid and thiamine   5. Tobacco abuse -conts on nicotine patch, will start titration of patch at this time  6. Poor dentition Will have staff  add pt to dental list   Janett Billow K. Harle Battiest  [facility] [phone] 8 am - 5 pm) [phone] (after hours)

## 2015-06-11 LAB — LIPID PANEL
Cholesterol: 114 mg/dL (ref 0–200)
HDL: 33 mg/dL — AB (ref 35–70)
LDL Cholesterol: 62 mg/dL
TRIGLYCERIDES: 96 mg/dL (ref 40–160)

## 2015-06-11 LAB — BASIC METABOLIC PANEL
BUN: 8 mg/dL (ref 4–21)
CREATININE: 0.8 mg/dL (ref <=1.3)
GLUCOSE: 96 mg/dL
Potassium: 4 mmol/L (ref 3.4–5.3)
SODIUM: 137 mmol/L (ref 137–147)

## 2015-06-11 LAB — CBC AND DIFFERENTIAL
HCT: 37 % — AB (ref 41–53)
Hemoglobin: 11.8 g/dL — AB (ref 13.5–17.5)
Platelets: 361 10*3/uL (ref 150–399)
WBC: 6.8 10^3/mL

## 2015-07-05 ENCOUNTER — Encounter: Payer: Self-pay | Admitting: Internal Medicine

## 2015-07-05 ENCOUNTER — Non-Acute Institutional Stay (SKILLED_NURSING_FACILITY): Payer: Medicare Other | Admitting: Internal Medicine

## 2015-07-05 DIAGNOSIS — I10 Essential (primary) hypertension: Secondary | ICD-10-CM

## 2015-07-05 DIAGNOSIS — I429 Cardiomyopathy, unspecified: Secondary | ICD-10-CM | POA: Diagnosis not present

## 2015-07-05 DIAGNOSIS — I272 Other secondary pulmonary hypertension: Secondary | ICD-10-CM | POA: Diagnosis not present

## 2015-07-05 DIAGNOSIS — F1011 Alcohol abuse, in remission: Secondary | ICD-10-CM

## 2015-07-05 DIAGNOSIS — F1010 Alcohol abuse, uncomplicated: Secondary | ICD-10-CM

## 2015-07-05 DIAGNOSIS — I739 Peripheral vascular disease, unspecified: Secondary | ICD-10-CM | POA: Diagnosis not present

## 2015-07-05 DIAGNOSIS — D638 Anemia in other chronic diseases classified elsewhere: Secondary | ICD-10-CM | POA: Diagnosis not present

## 2015-07-05 DIAGNOSIS — Z89432 Acquired absence of left foot: Secondary | ICD-10-CM

## 2015-07-05 NOTE — Progress Notes (Addendum)
Patient ID: Leon Foster, male   DOB: 12-07-1940, 75 y.o.   MRN: [id number]    DATE: 07/05/15  Location:  [facility]    Place of Service: SNF (31)   Extended Emergency Contact Information Primary Emergency Contact: Whitsett,Margaret Address: [address]          [place], [place] [address] [place] Phone: [phone] Relation: Sister  Advanced Directive information Does patient have an advance directive?: Yes, Type of Advance Directive: Living will, Does patient want to make changes to advanced directive?: No - Patient declined  Chief Complaint  Patient presents with  . Medical Management of Chronic Issues    HPI:   75 yo male long term resident seen today for f/u. He has no c/o. No nursing issues. He has an excellent appetite. No falls. No urinary incontinence  He is a poor historian due to memory loss associated with Etoh abuse. Hx obtained from chart. Per nursing, he has been more active in facility and going to lunch, socializing  PAD - he is s/p left TMA. Jan 2017 ABIs left 0.86 (prev 1.1) and right 0.80 (prev 0.93). He has had stents in the past. Takes plavix. Pain stable on roxicodone  pulm HTN - stable. PA pressure in 11/2014 was 64mmHg. He is on nicoderm patch for tob abuse  Cardiomyopathy - last 2D echo in Aug 2016 revealed EF 55-60% with grade 1 diastolic dysfunction and nml systolic fxn and mild biatrial enlargement. nml LV sive. He takes metoprolol and plavix  Etoh abuse - takes thiamine, folate and B12 supplements  Past Medical History  Diagnosis Date  . Tobacco dependence   . Alcoholic ([place])   . AVM (arteriovenous malformation) of colon     colonoscopy 2011 [facility]   . Cardiomyopathy ([place])     from recent hospitalization notes  . Acute renal failure ([place]) 11/2014    from recent hospitalization notes  . Gangrene of digit     left toe  . Confusion   . PAD (peripheral artery disease) ([place])   .  Leukocytosis   . Pulmonary hypertension ([place])   . Seizures ([place])      1 time seizure on 12/28/14 - seen in ED    Past Surgical History  Procedure Laterality Date  . Peripheral vascular catheterization N/A 12/14/2014    Procedure: Abdominal Aortogram;  Surgeon: Elam Dutch, MD;  Location: [place] [facility];  Service: Cardiovascular;  Laterality: N/A;  . Lower extremity angiogram Bilateral 12/14/2014    Procedure: Lower Extremity Angiogram;  Surgeon: Elam Dutch, MD;  Location: [place] [facility];  Service: Cardiovascular;  Laterality: Bilateral;  . Peripheral vascular catheterization Left 12/14/2014    Procedure: Peripheral Vascular Intervention;  Surgeon: Elam Dutch, MD;  Location: [place] [facility];  Service: Cardiovascular;  Laterality: Left;  SFA  . Amputation Left 01/01/2015    Procedure: Left Transmetatarsal Amputation;  Surgeon: Newt Minion, MD;  Location: [place];  Service: Orthopedics;  Laterality: Left;    Patient Care Team: Nolene Ebbs, MD as PCP - General (Internal Medicine) Glenford Bayley, DO as Consulting Physician (Family Medicine)  Social History   Social History  . Marital Status: Widowed    Spouse Name: N/A  . Number of Children: N/A  . Years of Education: N/A   Occupational History  . Not on file.   Social History Main Topics  . Smoking status: Current Every Day  Smoker -- 1.00 packs/day for 55 years    Types: Cigarettes  . Smokeless tobacco: Never Used     Comment: has not smoked since 11/27/2014  . Alcohol Use: 0.0 oz/week    0 Standard drinks or equivalent per week     Comment: 2-3 pints of wine daily  . Drug Use: No  . Sexual Activity: Not on file   Other Topics Concern  . Not on file   Social History Narrative     reports that he has been smoking Cigarettes.  He has a 55 pack-year smoking history. He has never used smokeless tobacco. He reports that he drinks alcohol. He reports that he does not use illicit drugs.  Immunization  History  Administered Date(s) Administered  . PPD Test 12/15/2014  . Tdap 03/14/2014    No Known Allergies  Medications: Patient's Medications  New Prescriptions   No medications on file  Previous Medications   ACETAMINOPHEN (TYLENOL) 325 MG TABLET    Take 650 mg by mouth every 6 (six) hours as needed for headache.   BISACODYL (DULCOLAX) 10 MG SUPPOSITORY    Place 10 mg rectally daily as needed for mild constipation or moderate constipation (if not relieved by MOM).   CLOPIDOGREL (PLAVIX) 75 MG TABLET    Take 1 tablet (75 mg total) by mouth daily with breakfast.   FOLIC ACID (FOLVITE) 1 MG TABLET    Take 1 mg by mouth daily.   MAGNESIUM HYDROXIDE (MILK OF MAGNESIA) 400 MG/5ML SUSPENSION    Take 30 mLs by mouth daily as needed for mild constipation.   METOPROLOL TARTRATE (LOPRESSOR) 25 MG TABLET    Take 0.5 tablets (12.5 mg total) by mouth 2 (two) times daily.   MULTIPLE VITAMIN (MULTIVITAMIN WITH MINERALS) TABS TABLET    Take 1 tablet by mouth daily.   NICOTINE (NICODERM CQ - DOSED IN MG/24 HOURS) 21 MG/24HR PATCH    Place 1 patch (21 mg total) onto the skin daily.   OXYCODONE (OXY IR/ROXICODONE) 5 MG IMMEDIATE RELEASE TABLET    Take 1 tablet (5 mg total) by mouth every 6 (six) hours as needed for moderate pain.   THIAMINE 100 MG TABLET    Take 1 tablet (100 mg total) by mouth daily.   VITAMIN B-12 (CYANOCOBALAMIN) 500 MCG TABLET    Take 500 mcg by mouth daily.  Modified Medications   No medications on file  Discontinued Medications   No medications on file    Review of Systems  Unable to perform ROS: Other  memory loss Filed Vitals:   07/05/15 1418  BP: 138/72  Pulse: 78  Temp: 97.6 F (36.4 C)  TempSrc: Oral  Resp: 18  Height: 5\' 7"  (1.702 m)  Weight: 146 lb 12.8 oz (66.588 kg)   Body mass index is 22.99 kg/(m^2).  Physical Exam  Constitutional: He appears well-developed.  Frail appearing in NAD lying in bed  HENT:  Mouth/Throat: Oropharynx is clear and moist.    Eyes: Pupils are equal, round, and reactive to light. No scleral icterus.  Neck: Neck supple. Carotid bruit is not present. No thyromegaly present.  Cardiovascular: Normal rate, regular rhythm and intact distal pulses.  Exam reveals no gallop and no friction rub.   Murmur (1/6 SEM) heard. no distal LE swelling. No calf TTP  Pulmonary/Chest: Effort normal and breath sounds normal. He has no wheezes. He has no rales. He exhibits no tenderness.  Abdominal: Soft. Bowel sounds are normal. He exhibits no distension, no  abdominal bruit, no pulsatile midline mass and no mass. There is no tenderness. There is no rebound and no guarding.  Musculoskeletal:  Left TMA  Lymphadenopathy:    He has no cervical adenopathy.  Neurological: He is alert.  Skin: Skin is warm and dry. No rash noted.  Psychiatric: He has a normal mood and affect. His behavior is normal.     Labs reviewed: Nursing Home on 07/05/2015  Component Date Value Ref Range Status  . Hemoglobin 06/11/2015 11.8* 13.5 - 17.5 g/dL Final  . HCT 06/11/2015 37* 41 - 53 % Final  . Platelets 06/11/2015 361  150 - 399 K/L Final  . WBC 06/11/2015 6.8   Final  . Glucose 06/11/2015 96   Final  . BUN 06/11/2015 8  4 - 21 mg/dL Final  . Creatinine 06/11/2015 0.8  .6 - 1.3 mg/dL Final  . Potassium 06/11/2015 4.0  3.4 - 5.3 mmol/L Final  . Sodium 06/11/2015 137  137 - 147 mmol/L Final  . Triglycerides 06/11/2015 96  40 - 160 mg/dL Final  . Cholesterol 06/11/2015 114  0 - 200 mg/dL Final  . HDL 06/11/2015 33* 35 - 70 mg/dL Final  . LDL Cholesterol 06/11/2015 62   Final  Abstract on 06/09/2015  Component Date Value Ref Range Status  . Glucose 12/22/2014 84   Final  . BUN 12/22/2014 13  4 - 21 mg/dL Final  . Creatinine 12/22/2014 1.2  0.6 - 1.3 mg/dL Final  . Potassium 12/22/2014 3.6  3.4 - 5.3 mmol/L Final  . Sodium 12/22/2014 138  137 - 147 mmol/L Final  . Alkaline Phosphatase 12/22/2014 52  25 - 125 U/L Final  . ALT 12/22/2014 8* 10 - 40  U/L Final  . AST 12/22/2014 11* 14 - 40 U/L Final  . Bilirubin, Total 12/22/2014 0.2   Final  . Hemoglobin 12/22/2014 9.1* 13.5 - 17.5 g/dL Final  . HCT 12/22/2014 28* 41 - 53 % Final  . Platelets 12/22/2014 380  150 - 399 K/L Final  . WBC 12/22/2014 9.3   Final    No results found.   Assessment/Plan   ICD-9-CM ICD-10-CM   1. Essential hypertension 401.9 I10   2. PAD (peripheral artery disease) (HCC) 443.9 I73.9   3. Status post transmetatarsal amputation of left foot (HCC) V49.73 Z89.432   4. H/O ETOH abuse 305.03 F10.10   5. Anemia, chronic disease 285.29 D63.8   6. Pulmonary hypertension (HCC) 416.8 I27.2   7. Cardiomyopathy ([place]) 425.4 I42.9     Cont current meds as ordered  PT/OT/ST as indicated  Nutritional supplements as ordered  Will follow  Leon Foster S. Perlie Gold  [facility] [address] [place], [place] [address] [phone] Cell (Monday-Friday 8 AM - 5 PM) [phone] After 5 PM and follow prompts

## 2015-07-15 ENCOUNTER — Encounter: Payer: Self-pay | Admitting: Adult Health

## 2015-07-15 ENCOUNTER — Non-Acute Institutional Stay (SKILLED_NURSING_FACILITY): Payer: Medicare Other | Admitting: Adult Health

## 2015-07-15 DIAGNOSIS — I739 Peripheral vascular disease, unspecified: Secondary | ICD-10-CM | POA: Diagnosis not present

## 2015-07-15 DIAGNOSIS — Z89432 Acquired absence of left foot: Secondary | ICD-10-CM | POA: Diagnosis not present

## 2015-07-15 DIAGNOSIS — F10231 Alcohol dependence with withdrawal delirium: Secondary | ICD-10-CM | POA: Diagnosis not present

## 2015-07-15 DIAGNOSIS — F1010 Alcohol abuse, uncomplicated: Secondary | ICD-10-CM

## 2015-07-15 DIAGNOSIS — I10 Essential (primary) hypertension: Secondary | ICD-10-CM | POA: Diagnosis not present

## 2015-07-15 NOTE — Progress Notes (Signed)
Patient ID: Leon Foster, male   DOB: 01/13/1941, 75 y.o.   MRN: [id number]   Facility: [facility]       No Known Allergies  Chief Complaint  Patient presents with  . Medical Management of Chronic Issues    Follow up    HPI:  He is a long term resident of this facility being seen for the management of his chronic illnesses. Overall there is little change in his status. He is unable to participate in the hpi or ros. There are no nursing concerns at this time.    Past Medical History  Diagnosis Date  . Tobacco dependence   . Alcoholic ([place])   . AVM (arteriovenous malformation) of colon     colonoscopy 2011 [facility]   . Cardiomyopathy ([place])     from recent hospitalization notes  . Acute renal failure ([place]) 11/2014    from recent hospitalization notes  . Gangrene of digit     left toe  . Confusion   . PAD (peripheral artery disease) ([place])   . Leukocytosis   . Pulmonary hypertension ([place])   . Seizures ([place])      1 time seizure on 12/28/14 - seen in ED    Past Surgical History  Procedure Laterality Date  . Peripheral vascular catheterization N/A 12/14/2014    Procedure: Abdominal Aortogram;  Surgeon: Elam Dutch, MD;  Location: [facility];  Service: Cardiovascular;  Laterality: N/A;  . Lower extremity angiogram Bilateral 12/14/2014    Procedure: Lower Extremity Angiogram;  Surgeon: Elam Dutch, MD;  Location: [facility];  Service: Cardiovascular;  Laterality: Bilateral;  . Peripheral vascular catheterization Left 12/14/2014    Procedure: Peripheral Vascular Intervention;  Surgeon: Elam Dutch, MD;  Location: [facility];  Service: Cardiovascular;  Laterality: Left;  SFA  . Amputation Left 01/01/2015    Procedure: Left Transmetatarsal Amputation;  Surgeon: Newt Minion, MD;  Location: [facility];  Service: Orthopedics;  Laterality: Left;    VITAL SIGNS BP 138/72 mmHg  Pulse 78  Resp 18  Ht 5\' 7"  (1.702 m)  Wt 146 lb 8 oz (66.452 kg)   BMI 22.94 kg/m2  Patient's Medications  New Prescriptions   No medications on file  Previous Medications   ACETAMINOPHEN (TYLENOL) 325 MG TABLET    Take 650 mg by mouth every 6 (six) hours as needed for headache.   BISACODYL (DULCOLAX) 10 MG SUPPOSITORY    Place 10 mg rectally daily as needed for mild constipation or moderate constipation (if not relieved by MOM).    CLOPIDOGREL (PLAVIX) 75 MG TABLET    Take 1 tablet (75 mg total) by mouth daily with breakfast.   FOLIC ACID (FOLVITE) 1 MG TABLET    Take 1 mg by mouth daily.   MAGNESIUM HYDROXIDE (MILK OF MAGNESIA) 400 MG/5ML SUSPENSION    Take 30 mLs by mouth daily as needed for mild constipation.   METOPROLOL TARTRATE (LOPRESSOR) 25 MG TABLET    Take 0.5 tablets (12.5 mg total) by mouth 2 (two) times daily.   MULTIPLE VITAMIN (MULTIVITAMIN WITH MINERALS) TABS TABLET    Take 1 tablet by mouth daily.   OXYCODONE (OXY IR/ROXICODONE) 5 MG IMMEDIATE RELEASE TABLET    Take 1 tablet (5 mg total) by mouth every 6 (six) hours as needed for moderate pain.   THIAMINE 100 MG TABLET    Take 1 tablet (100 mg total) by mouth daily.   VITAMIN B-12 (CYANOCOBALAMIN)  500 MCG TABLET    Take 500 mcg by mouth daily.  Modified Medications   No medications on file  Discontinued Medications     SIGNIFICANT DIAGNOSTIC EXAMS   LABS REVIEWED:   06-10-15: wbc 6.8; hgb 11.8; hct 36.7; mcv 82.8; plt 361; glucose 96; bun 8; creat 0.83; k+ 4.0; na++137; chol 114; ldl 62; trig 96; hdl 33     Review of Systems  Unable to perform ROS: other    Physical Exam  Constitutional: No distress.  Frail   Eyes: Conjunctivae are normal.  Neck: Neck supple. No JVD present. No thyromegaly present.  Cardiovascular: Normal rate, regular rhythm and intact distal pulses.   Murmur heard. 1/6  Respiratory: Effort normal and breath sounds normal. No respiratory distress. He has no wheezes.  GI: Soft. Bowel sounds are normal. He exhibits no distension. There is no tenderness.    Musculoskeletal: He exhibits no edema.  Able to move all extremities  Status post left transmetatarsal amputation   Lymphadenopathy:    He has no cervical adenopathy.  Neurological: He is alert.  Skin: Skin is warm and dry. He is not diaphoretic.  Psychiatric: He has a normal mood and affect.      ASSESSMENT/ PLAN:  1. Anemia of chronic disease: his hgb is 11.8; will monitor  2. PAD: is status post left transmetatarsal amputation; will continue plavix 75 mg daily; has oxycodone 5 mg every 6 hours as needed for pain.  3. Hypertension: will continue lopressor 12.5 mg twice daily   4. Alcohol abuse: will continue thiamine; folic acid; vit 123456.     Ok Edwards NP [facility]  Contact [phone] Monday through Friday 8am- 5pm  After hours call [phone]

## 2015-08-02 ENCOUNTER — Encounter: Payer: Self-pay | Admitting: Internal Medicine

## 2015-08-02 ENCOUNTER — Non-Acute Institutional Stay (SKILLED_NURSING_FACILITY): Payer: Medicare Other | Admitting: Internal Medicine

## 2015-08-02 DIAGNOSIS — D638 Anemia in other chronic diseases classified elsewhere: Secondary | ICD-10-CM | POA: Diagnosis not present

## 2015-08-02 DIAGNOSIS — F1011 Alcohol abuse, in remission: Secondary | ICD-10-CM

## 2015-08-02 DIAGNOSIS — Z89432 Acquired absence of left foot: Secondary | ICD-10-CM | POA: Diagnosis not present

## 2015-08-02 DIAGNOSIS — I739 Peripheral vascular disease, unspecified: Secondary | ICD-10-CM

## 2015-08-02 DIAGNOSIS — I10 Essential (primary) hypertension: Secondary | ICD-10-CM | POA: Diagnosis not present

## 2015-08-02 DIAGNOSIS — F1010 Alcohol abuse, uncomplicated: Secondary | ICD-10-CM | POA: Diagnosis not present

## 2015-08-02 NOTE — Progress Notes (Signed)
Patient ID: Leon Foster, male   DOB: 01/30/1941, 75 y.o.   MRN: [id number]    DATE: 08/02/15  Location:  [facility]  Nursing Home Room Number: 130 B Place of Service: SNF (31)   Extended Emergency Contact Information Primary Emergency Contact: Whitsett,Margaret Address: [address]          [place],  [address] [place] Phone: [phone] Relation: Sister  Advanced Directive information Does patient have an advance directive?: No, Does patient want to make changes to advanced directive?: No - Patient declined  Chief Complaint  Patient presents with  . Medical Management of Chronic Issues    Routine Visit    HPI:   75 yo male long term resident seen today for f/u. He has no concerns. Reports he is able to walk better despite having a left TMA. No nursing issues. He has an excellent appetite. No falls. No urinary incontinence  He is a poor historian due to memory loss associated with Etoh abuse. Hx obtained from chart. Per nursing, he has been more active in facility and going to lunch, socializing, helping other residents to and from dining room.  PAD - he is s/p left TMA. Jan 2017 ABIs left 0.86 (prev 1.1) and right 0.80 (prev 0.93). He has had stents in the past. Takes plavix. Pain stable on roxicodone  pulm HTN - stable. PA pressure in 11/2014 was 80mmHg. He is on nicoderm patch for tob abuse  Cardiomyopathy - last 2D echo in Aug 2016 revealed EF 55-60% with grade 1 diastolic dysfunction and nml systolic fxn and mild biatrial enlargement. nml LV sive. He takes metoprolol and plavix  Hx Etoh abuse - taking thiamine, folate and B12 supplements  Anemia - improved. hgb 11.8  Past Medical History  Diagnosis Date  . Tobacco dependence   . Alcoholic ([place])   . AVM (arteriovenous malformation) of colon     colonoscopy 2011 [facility]   . Cardiomyopathy ([place])     from recent hospitalization notes  . Acute renal failure  ([place]) 11/2014    from recent hospitalization notes  . Gangrene of digit     left toe  . Confusion   . PAD (peripheral artery disease) ([place])   . Leukocytosis   . Pulmonary hypertension ([place])   . Seizures ([place])      1 time seizure on 12/28/14 - seen in ED    Past Surgical History  Procedure Laterality Date  . Peripheral vascular catheterization N/A 12/14/2014    Procedure: Abdominal Aortogram;  Surgeon: Elam Dutch, MD;  Location: [facility];  Service: Cardiovascular;  Laterality: N/A;  . Lower extremity angiogram Bilateral 12/14/2014    Procedure: Lower Extremity Angiogram;  Surgeon: Elam Dutch, MD;  Location: [facility];  Service: Cardiovascular;  Laterality: Bilateral;  . Peripheral vascular catheterization Left 12/14/2014    Procedure: Peripheral Vascular Intervention;  Surgeon: Elam Dutch, MD;  Location: [facility];  Service: Cardiovascular;  Laterality: Left;  SFA  . Amputation Left 01/01/2015    Procedure: Left Transmetatarsal Amputation;  Surgeon: Newt Minion, MD;  Location: [facility];  Service: Orthopedics;  Laterality: Left;    Patient Care Team: Gildardo Cranker, DO as PCP - General (Internal Medicine)  Social History   Social History  . Marital Status: Widowed    Spouse Name: N/A  . Number of Children: N/A  . Years of Education: N/A  Occupational History  . Not on file.   Social History Main Topics  . Smoking status: Current Every Day Smoker -- 1.00 packs/day for 55 years    Types: Cigarettes  . Smokeless tobacco: Never Used     Comment: has not smoked since 11/27/2014  . Alcohol Use: 0.0 oz/week    0 Standard drinks or equivalent per week     Comment: 2-3 pints of wine daily  . Drug Use: No  . Sexual Activity: Not on file   Other Topics Concern  . Not on file   Social History Narrative     reports that he has been smoking Cigarettes.  He has a 55 pack-year smoking history. He has never used smokeless tobacco. He reports  that he drinks alcohol. He reports that he does not use illicit drugs.  Immunization History  Administered Date(s) Administered  . Influenza-Unspecified 01/27/2015  . PPD Test 12/15/2014  . Tdap 03/14/2014    No Known Allergies  Medications: Patient's Medications  New Prescriptions   No medications on file  Previous Medications   ACETAMINOPHEN (TYLENOL) 325 MG TABLET    Take 650 mg by mouth every 6 (six) hours as needed for headache.   AMBULATORY NON FORMULARY MEDICATION    Med Pass: Give 120 ml by mouth twice daily.   BISACODYL (DULCOLAX) 10 MG SUPPOSITORY    Place 10 mg rectally daily as needed for mild constipation or moderate constipation (if not relieved by MOM). Reported on 07/15/2015   CLOPIDOGREL (PLAVIX) 75 MG TABLET    Take 1 tablet (75 mg total) by mouth daily with breakfast.   MAGNESIUM HYDROXIDE (MILK OF MAGNESIA) 400 MG/5ML SUSPENSION    Take 30 mLs by mouth daily as needed for mild constipation.   METOPROLOL TARTRATE (LOPRESSOR) 25 MG TABLET    Take 0.5 tablets (12.5 mg total) by mouth 2 (two) times daily.   MULTIPLE VITAMIN (MULTIVITAMIN WITH MINERALS) TABS TABLET    Take 1 tablet by mouth daily.   OXYCODONE (OXY IR/ROXICODONE) 5 MG IMMEDIATE RELEASE TABLET    Take 1 tablet (5 mg total) by mouth every 6 (six) hours as needed for moderate pain.  Modified Medications   No medications on file  Discontinued Medications   FOLIC ACID (FOLVITE) 1 MG TABLET    Take 1 mg by mouth daily.   THIAMINE 100 MG TABLET    Take 1 tablet (100 mg total) by mouth daily.   VITAMIN B-12 (CYANOCOBALAMIN) 500 MCG TABLET    Take 500 mcg by mouth daily.    Review of Systems  Unable to perform ROS: Dementia    Filed Vitals:   08/02/15 1120  BP: 138/72  Pulse: 78  Temp: 97.6 F (36.4 C)  TempSrc: Oral  Resp: 18  Height: 5\' 7"  (1.702 m)  Weight: 150 lb 9.6 oz (68.312 kg)   Body mass index is 23.58 kg/(m^2).  Physical Exam  Constitutional: He appears well-developed.  Frail  appearing in NAD lying in bed  HENT:  Mouth/Throat: Oropharynx is clear and moist.  Eyes: Pupils are equal, round, and reactive to light. No scleral icterus.  Neck: Neck supple. Carotid bruit is not present. No thyromegaly present.  Cardiovascular: Normal rate, regular rhythm and intact distal pulses.  Exam reveals no gallop and no friction rub.   Murmur (1/6 SEM) heard. no distal LE swelling. No calf TTP  Pulmonary/Chest: Effort normal and breath sounds normal. He has no wheezes. He has no rales. He exhibits no tenderness.  Abdominal: Soft. Bowel sounds are normal. He exhibits no distension, no abdominal bruit, no pulsatile midline mass and no mass. There is no tenderness. There is no rebound and no guarding.  Musculoskeletal:  Left TMA  Lymphadenopathy:    He has no cervical adenopathy.  Neurological: He is alert.  Skin: Skin is warm and dry. No rash noted.  Psychiatric: He has a normal mood and affect. His behavior is normal.     Labs reviewed: Nursing Home on 07/05/2015  Component Date Value Ref Range Status  . Hemoglobin 06/11/2015 11.8* 13.5 - 17.5 g/dL Final  . HCT 06/11/2015 37* 41 - 53 % Final  . Platelets 06/11/2015 361  150 - 399 K/L Final  . WBC 06/11/2015 6.8   Final  . Glucose 06/11/2015 96   Final  . BUN 06/11/2015 8  4 - 21 mg/dL Final  . Creatinine 06/11/2015 0.8  .6 - 1.3 mg/dL Final  . Potassium 06/11/2015 4.0  3.4 - 5.3 mmol/L Final  . Sodium 06/11/2015 137  137 - 147 mmol/L Final  . Triglycerides 06/11/2015 96  40 - 160 mg/dL Final  . Cholesterol 06/11/2015 114  0 - 200 mg/dL Final  . HDL 06/11/2015 33* 35 - 70 mg/dL Final  . LDL Cholesterol 06/11/2015 62   Final  Abstract on 06/09/2015  Component Date Value Ref Range Status  . Glucose 12/22/2014 84   Final  . BUN 12/22/2014 13  4 - 21 mg/dL Final  . Creatinine 12/22/2014 1.2  0.6 - 1.3 mg/dL Final  . Potassium 12/22/2014 3.6  3.4 - 5.3 mmol/L Final  . Sodium 12/22/2014 138  137 - 147 mmol/L Final  .  Alkaline Phosphatase 12/22/2014 52  25 - 125 U/L Final  . ALT 12/22/2014 8* 10 - 40 U/L Final  . AST 12/22/2014 11* 14 - 40 U/L Final  . Bilirubin, Total 12/22/2014 0.2   Final  . Hemoglobin 12/22/2014 9.1* 13.5 - 17.5 g/dL Final  . HCT 12/22/2014 28* 41 - 53 % Final  . Platelets 12/22/2014 380  150 - 399 K/L Final  . WBC 12/22/2014 9.3   Final    No results found.   Assessment/Plan   ICD-9-CM ICD-10-CM   1. PAD (peripheral artery disease) (HCC) -stable 443.9 I73.9   2. Status post transmetatarsal amputation of left foot (HCC) - gait improving V49.73 Z89.432   3. Essential hypertension 401.9 I10   4. H/O ETOH abuse 305.03 F10.10   5. Anemia, chronic disease - improved 285.29 D63.8     Pt is medically stable on current tx plan. Continue current medications as ordered, including b12/folate/thiamine. PT/OT/ST as indicated. Will follow  Leon Foster  [facility] [address] [place], [place] [address] [phone] Cell (Monday-Friday 8 AM - 5 PM) [phone] After 5 PM and follow prompts

## 2015-09-02 ENCOUNTER — Encounter: Payer: Self-pay | Admitting: Adult Health

## 2015-09-02 ENCOUNTER — Non-Acute Institutional Stay (SKILLED_NURSING_FACILITY): Payer: Medicare Other | Admitting: Adult Health

## 2015-09-02 DIAGNOSIS — F1010 Alcohol abuse, uncomplicated: Secondary | ICD-10-CM

## 2015-09-02 DIAGNOSIS — I429 Cardiomyopathy, unspecified: Secondary | ICD-10-CM

## 2015-09-02 DIAGNOSIS — D638 Anemia in other chronic diseases classified elsewhere: Secondary | ICD-10-CM | POA: Diagnosis not present

## 2015-09-02 DIAGNOSIS — Z89432 Acquired absence of left foot: Secondary | ICD-10-CM

## 2015-09-02 DIAGNOSIS — I272 Other secondary pulmonary hypertension: Secondary | ICD-10-CM | POA: Diagnosis not present

## 2015-09-02 DIAGNOSIS — I739 Peripheral vascular disease, unspecified: Secondary | ICD-10-CM | POA: Diagnosis not present

## 2015-09-02 NOTE — Progress Notes (Signed)
Location:  [facility] Room Number: 130 B Place of Service:  SNF (31)   CODE STATUS: full code   No Known Allergies  Chief Complaint  Patient presents with  . Medical Management of Chronic Issues    Routine Visit    HPI:  He is a long term resident of this facility being seen for the management of his chronic illnesses. Overall there is little change in his status.  His weight is 154 pounds. He is unable to participate in the hpi or ros. There are no nursing concerns at this time.     Past Medical History  Diagnosis Date  . Tobacco dependence   . Alcoholic ([place])   . AVM (arteriovenous malformation) of colon     colonoscopy 2011 [facility]   . Cardiomyopathy ([place])     from recent hospitalization notes  . Acute renal failure ([place]) 11/2014    from recent hospitalization notes  . Gangrene of digit     left toe  . Confusion   . PAD (peripheral artery disease) ([place])   . Leukocytosis   . Pulmonary hypertension ([place])   . Seizures ([place])      1 time seizure on 12/28/14 - seen in ED    Past Surgical History  Procedure Laterality Date  . Peripheral vascular catheterization N/A 12/14/2014    Procedure: Abdominal Aortogram;  Surgeon: Elam Dutch, MD;  Location: [facility];  Service: Cardiovascular;  Laterality: N/A;  . Lower extremity angiogram Bilateral 12/14/2014    Procedure: Lower Extremity Angiogram;  Surgeon: Elam Dutch, MD;  Location: [facility];  Service: Cardiovascular;  Laterality: Bilateral;  . Peripheral vascular catheterization Left 12/14/2014    Procedure: Peripheral Vascular Intervention;  Surgeon: Elam Dutch, MD;  Location: [facility];  Service: Cardiovascular;  Laterality: Left;  SFA  . Amputation Left 01/01/2015    Procedure: Left Transmetatarsal Amputation;  Surgeon: Newt Minion, MD;  Location: [place] [facility];  Service: Orthopedics;  Laterality: Left;    Social History   Social History  . Marital  Status: Widowed    Spouse Name: N/A  . Number of Children: N/A  . Years of Education: N/A   Occupational History  . Not on file.   Social History Main Topics  . Smoking status: Current Every Day Smoker -- 1.00 packs/day for 55 years    Types: Cigarettes  . Smokeless tobacco: Never Used     Comment: has not smoked since 11/27/2014  . Alcohol Use: 0.0 oz/week    0 Standard drinks or equivalent per week     Comment: 2-3 pints of wine daily  . Drug Use: No  . Sexual Activity: Not on file   Other Topics Concern  . Not on file   Social History Narrative   Family History  Problem Relation Age of Onset  . Stroke Sister   . Deep vein thrombosis Sister   . Cancer Brother   . Diabetes Brother   . Hyperlipidemia Brother   . Hypertension Brother   . Heart disease Father   . Hypertension Sister       VITAL SIGNS BP 148/56 mmHg  Pulse 84  Temp(Src) 97.3 F (36.3 C) (Oral)  Resp 20  Ht 5\' 7"  (1.702 m)  Wt 152 lb 6.4 oz (69.128 kg)  BMI 23.86 kg/m2  Patient's Medications  New Prescriptions   No medications on file  Previous Medications  ACETAMINOPHEN (TYLENOL) 325 MG TABLET    Take 650 mg by mouth every 6 (six) hours as needed for headache.   AMBULATORY NON FORMULARY MEDICATION    Med Pass: Give 120 ml by mouth twice daily.   BISACODYL (DULCOLAX) 10 MG SUPPOSITORY    Place 10 mg rectally daily as needed for mild constipation or moderate constipation (if not relieved by MOM). Reported on 07/15/2015   CLOPIDOGREL (PLAVIX) 75 MG TABLET    Take 1 tablet (75 mg total) by mouth daily with breakfast.   MAGNESIUM HYDROXIDE (MILK OF MAGNESIA) 400 MG/5ML SUSPENSION    Take 30 mLs by mouth daily as needed for mild constipation.   METOPROLOL TARTRATE (LOPRESSOR) 25 MG TABLET    Take 0.5 tablets (12.5 mg total) by mouth 2 (two) times daily.   MULTIPLE VITAMIN (MULTIVITAMIN WITH MINERALS) TABS TABLET    Take 1 tablet by mouth daily.   OXYCODONE (OXY IR/ROXICODONE) 5 MG IMMEDIATE RELEASE  TABLET    Take 1 tablet (5 mg total) by mouth every 6 (six) hours as needed for moderate pain.  Modified Medications   No medications on file  Discontinued Medications   No medications on file     SIGNIFICANT DIAGNOSTIC EXAMS   LABS REVIEWED:   06-10-15: wbc 6.8; hgb 11.8; hct 36.7; mcv 82.8; plt 361; glucose 96; bun 8; creat 0.83; k+ 4.0; na++137; chol 114; ldl 62; trig 96; hdl 33  09-01-15: wbc 7.7; hgb 13.1; hct 39.3; mcv 96.6; plt 247; glucose 96; bun 17; creat 0.59; k+ 4.1; na++138; liver normal albumin 3.3  Chol 125; ldl 83; trig 60 hdl 30     Review of Systems  Unable to perform ROS: other    Physical Exam  Constitutional: No distress.  Frail   Eyes: Conjunctivae are normal.  Neck: Neck supple. No JVD present. No thyromegaly present.  Cardiovascular: Normal rate, regular rhythm and intact distal pulses.   Murmur heard. 1/6  Respiratory: Effort normal and breath sounds normal. No respiratory distress. He has no wheezes.  GI: Soft. Bowel sounds are normal. He exhibits no distension. There is no tenderness.  Musculoskeletal: He exhibits no edema.  Able to move all extremities  Status post left transmetatarsal amputation   Lymphadenopathy:    He has no cervical adenopathy.  Neurological: He is alert.  Skin: Skin is warm and dry. He is not diaphoretic.  Psychiatric: He has a normal mood and affect.      ASSESSMENT/ PLAN:  1. Anemia of chronic disease: his hgb is 13.8; will monitor  2. PAD: is status post left transmetatarsal amputation; will continue plavix 75 mg daily; has oxycodone 5 mg every 6 hours as needed for pain.  3. Hypertension: will continue lopressor 12.5 mg twice daily   4. Alcohol abuse: will continue thiamine; folic acid; vit 123456.   5. Ischemic cardiomyopathy:  EF 55-60% with grade 1 diastolic dysfunction (Aug 2016) will continue lopressor 12.5 mg twice daily will monitor      Ok Edwards NP [facility]  Contact [phone]  Monday through Friday 8am- 5pm  After hours call [phone]

## 2015-09-22 ENCOUNTER — Ambulatory Visit (INDEPENDENT_AMBULATORY_CARE_PROVIDER_SITE_OTHER): Payer: Medicare Other | Admitting: Podiatry

## 2015-09-22 ENCOUNTER — Encounter: Payer: Self-pay | Admitting: Podiatry

## 2015-09-22 DIAGNOSIS — B351 Tinea unguium: Secondary | ICD-10-CM | POA: Diagnosis not present

## 2015-09-22 DIAGNOSIS — M79674 Pain in right toe(s): Secondary | ICD-10-CM

## 2015-09-22 NOTE — Progress Notes (Signed)
Patient ID: Leon Foster, male   DOB: 1941-03-03, 75 y.o.   MRN: [id number]  Subjective: This patient presents for scheduled visit complaining of uncomfortable toenails and right foot when walking wearing shoes and requests toenail debridement. Patient's brother is present in the treatment room  Objective: Vascular: DP pulses 0/4 bilaterally PT pulses 0/4 bilaterally  Neurological: Sensation to 10 g monofilament wire intact 5/5 bilaterally Vibratory sensation nonreactive bilaterally Ankle reflex equal and reactive bilaterally  Dermatological: Open skin lesions bilaterally Toenails 1-5 are elongated, hypertrophic, deformed and tender to direct palpation  Musculoskeletal: Transmetatarsal amputation left foot well-healed incision site HAV right Hammertoe second right Ankle motion 90, bilaterally  Assessment: Absent pedal pulses consistent with known history of peripheral arterial disease Transmetatarsal amputation left Symptomatic onychomycosis 1-5 right HAV right Hammertoe second right  Plan: Today review the results examination with patient sister and treatment room. I made aware that circulation was very limited in his right foot recommended toenails debrided professionally. Patient consents  The toenails 1-5 right are debrided mechanically and electronically without any bleeding  Reappoint 3 months

## 2015-09-22 NOTE — Patient Instructions (Signed)
Diabetes and Foot Care Diabetes may cause you to have problems because of poor blood supply (circulation) to your feet and legs. This may cause the skin on your feet to become thinner, break easier, and heal more slowly. Your skin may become dry, and the skin may peel and crack. You may also have nerve damage in your legs and feet causing decreased feeling in them. You may not notice minor injuries to your feet that could lead to infections or more serious problems. Taking care of your feet is one of the most important things you can do for yourself.  HOME CARE INSTRUCTIONS  Wear shoes at all times, even in the house. Do not go barefoot. Bare feet are easily injured.  Check your feet daily for blisters, cuts, and redness. If you cannot see the bottom of your feet, use a mirror or ask someone for help.  Wash your feet with warm water (do not use hot water) and mild soap. Then pat your feet and the areas between your toes until they are completely dry. Do not soak your feet as this can dry your skin.  Apply a moisturizing lotion or petroleum jelly (that does not contain alcohol and is unscented) to the skin on your feet and to dry, brittle toenails. Do not apply lotion between your toes.  Trim your toenails straight across. Do not dig under them or around the cuticle. File the edges of your nails with an emery board or nail file.  Do not cut corns or calluses or try to remove them with medicine.  Wear clean socks or stockings every day. Make sure they are not too tight. Do not wear knee-high stockings since they may decrease blood flow to your legs.  Wear shoes that fit properly and have enough cushioning. To break in new shoes, wear them for just a few hours a day. This prevents you from injuring your feet. Always look in your shoes before you put them on to be sure there are no objects inside.  Do not cross your legs. This may decrease the blood flow to your feet.  If you find a minor scrape,  cut, or break in the skin on your feet, keep it and the skin around it clean and dry. These areas may be cleansed with mild soap and water. Do not cleanse the area with peroxide, alcohol, or iodine.  When you remove an adhesive bandage, be sure not to damage the skin around it.  If you have a wound, look at it several times a day to make sure it is healing.  Do not use heating pads or hot water bottles. They may burn your skin. If you have lost feeling in your feet or legs, you may not know it is happening until it is too late.  Make sure your health care provider performs a complete foot exam at least annually or more often if you have foot problems. Report any cuts, sores, or bruises to your health care provider immediately. SEEK MEDICAL CARE IF:   You have an injury that is not healing.  You have cuts or breaks in the skin.  You have an ingrown nail.  You notice redness on your legs or feet.  You feel burning or tingling in your legs or feet.  You have pain or cramps in your legs and feet.  Your legs or feet are numb.  Your feet always feel cold. SEEK IMMEDIATE MEDICAL CARE IF:   There is increasing redness,   swelling, or pain in or around a wound.  There is a red line that goes up your leg.  Pus is coming from a wound.  You develop a fever or as directed by your health care provider.  You notice a bad smell coming from an ulcer or wound.   This information is not intended to replace advice given to you by your health care provider. Make sure you discuss any questions you have with your health care provider.   Document Released: 03/31/2000 Document Revised: 12/04/2012 Document Reviewed: 09/10/2012 Elsevier Interactive Patient Education 2016 Elsevier Inc.  

## 2015-10-07 ENCOUNTER — Encounter: Payer: Self-pay | Admitting: Internal Medicine

## 2015-10-07 ENCOUNTER — Non-Acute Institutional Stay (SKILLED_NURSING_FACILITY): Payer: Medicare Other | Admitting: Internal Medicine

## 2015-10-07 DIAGNOSIS — E46 Unspecified protein-calorie malnutrition: Secondary | ICD-10-CM | POA: Insufficient documentation

## 2015-10-07 DIAGNOSIS — Z89432 Acquired absence of left foot: Secondary | ICD-10-CM

## 2015-10-07 DIAGNOSIS — I10 Essential (primary) hypertension: Secondary | ICD-10-CM

## 2015-10-07 DIAGNOSIS — F0390 Unspecified dementia without behavioral disturbance: Secondary | ICD-10-CM | POA: Insufficient documentation

## 2015-10-07 DIAGNOSIS — F1010 Alcohol abuse, uncomplicated: Secondary | ICD-10-CM | POA: Diagnosis not present

## 2015-10-07 DIAGNOSIS — I429 Cardiomyopathy, unspecified: Secondary | ICD-10-CM | POA: Diagnosis not present

## 2015-10-07 DIAGNOSIS — I739 Peripheral vascular disease, unspecified: Secondary | ICD-10-CM | POA: Diagnosis not present

## 2015-10-07 DIAGNOSIS — Z720 Tobacco use: Secondary | ICD-10-CM | POA: Diagnosis not present

## 2015-10-07 DIAGNOSIS — F1011 Alcohol abuse, in remission: Secondary | ICD-10-CM | POA: Insufficient documentation

## 2015-10-07 NOTE — Progress Notes (Signed)
DATE: 10/07/15  Location:  [facility] Room Number: 130 B Place of Service: SNF (31)   Extended Emergency Contact Information Primary Emergency Contact: Whitsett,Margaret Address: [address]          [place], [place] [address] [place] Phone: [phone] Relation: Sister  Advanced Directive information Does patient have an advance directive?: No, Would patient like information on creating an advanced directive?: No - patient declined information, Does patient want to make changes to advanced directive?: No - Patient declined [place] Chief Complaint  Patient presents with  . Medical Management of Chronic Issues    Routine Visit    HPI:  75 yo male long term resident seen today for f/u. He has no concerns. No nursing issues. No falls. 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 He is a poor historian due to dementia. Hx obtained from chart  Anemia of chronic disease -  Stable with hgb 13.8  PAD - s/p left transmetatarsal amputation;takes  plavix 75 mg daily; oxycodone 5 mg every 6 hours as needed for pain. He is still smoking cigs. Saw podiatry recently and had mycotic toenails treated. Right foot pain addressed  Hypertension: will continue lopressor 12.5 mg twice daily   Hx Alcohol abuse - takes thiamine, folic acid, and vit 123456.   Ischemic cardiomyopathy - EF 55-60% with grade 1 diastolic dysfunction (Aug 2016); takes lopressor 12.5 mg twice daily  Tobacco abuse - he is still smoking cigs but has reduced amt per pt  Dementia - currently not on any meds. Last albumin 2.6. Takes MVI daily  Past Medical History  Diagnosis Date  . Tobacco dependence   . Alcoholic ([place])   . AVM (arteriovenous malformation) of colon     colonoscopy 2011 [facility]   . Cardiomyopathy ([place])     from recent hospitalization notes  . Acute renal failure ([place]) 11/2014    from recent hospitalization notes  .  Gangrene of digit     left toe  . Confusion   . PAD (peripheral artery disease) ([place])   . Leukocytosis   . Pulmonary hypertension ([place])   . Seizures ([place])      1 time seizure on 12/28/14 - seen in ED    Past Surgical History  Procedure Laterality Date  . Peripheral vascular catheterization N/A 12/14/2014    Procedure: Abdominal Aortogram;  Surgeon: Elam Dutch, MD;  Location: [facility];  Service: Cardiovascular;  Laterality: N/A;  . Lower extremity angiogram Bilateral 12/14/2014    Procedure: Lower Extremity Angiogram;  Surgeon: Elam Dutch, MD;  Location: [facility];  Service: Cardiovascular;  Laterality: Bilateral;  . Peripheral vascular catheterization Left 12/14/2014    Procedure: Peripheral Vascular Intervention;  Surgeon: Elam Dutch, MD;  Location: [place];  Service: Cardiovascular;  Laterality: Left;  SFA  . Amputation Left 01/01/2015    Procedure: Left Transmetatarsal Amputation;  Surgeon: Newt Minion, MD;  Location: [facility];  Service: Orthopedics;  Laterality: Left;    Patient Care Team: Gildardo Cranker, DO as PCP - General (Internal Medicine)  Social History   Social History  . Marital Status: Widowed    Spouse Name: N/A  . Number of Children: N/A  . Years of Education: N/A   Occupational History  . Not on file.   Social History Main Topics  . Smoking status: Current Every Day Smoker --  1.00 packs/day for 55 years    Types: Cigarettes  . Smokeless tobacco: Never Used     Comment: has not smoked since 11/27/2014  . Alcohol Use: 0.0 oz/week    0 Standard drinks or equivalent per week     Comment: 2-3 pints of wine daily  . Drug Use: No  . Sexual Activity: Not on file   Other Topics Concern  . Not on file   Social History Narrative     reports that he has been smoking Cigarettes.  He has a 55 pack-year smoking history. He has never used smokeless tobacco. He reports that he drinks alcohol. He reports that he does not use  illicit drugs.  Family History  Problem Relation Age of Onset  . Stroke Sister   . Deep vein thrombosis Sister   . Cancer Brother   . Diabetes Brother   . Hyperlipidemia Brother   . Hypertension Brother   . Heart disease Father   . Hypertension Sister    Family Status  Relation Status Death Age  . Sister Alive   . Brother Alive   . Mother Deceased   . Father Deceased   . Sister Alive     Immunization History  Administered Date(s) Administered  . Influenza-Unspecified 01/27/2015  . PPD Test 12/15/2014  . Tdap 03/14/2014    No Known Allergies  Medications: Patient's Medications  New Prescriptions   No medications on file  Previous Medications   ACETAMINOPHEN (TYLENOL) 325 MG TABLET    Take 650 mg by mouth every 6 (six) hours as needed for headache.   BISACODYL (DULCOLAX) 10 MG SUPPOSITORY    Place 10 mg rectally daily as needed for mild constipation or moderate constipation (if not relieved by MOM). Reported on 07/15/2015   CLOPIDOGREL (PLAVIX) 75 MG TABLET    Take 1 tablet (75 mg total) by mouth daily with breakfast.   MAGNESIUM HYDROXIDE (MILK OF MAGNESIA) 400 MG/5ML SUSPENSION    Take 30 mLs by mouth daily as needed for mild constipation.   METOPROLOL TARTRATE (LOPRESSOR) 25 MG TABLET    Take 0.5 tablets (12.5 mg total) by mouth 2 (two) times daily.   MULTIPLE VITAMIN (MULTIVITAMIN WITH MINERALS) TABS TABLET    Take 1 tablet by mouth daily.   OXYCODONE (OXY IR/ROXICODONE) 5 MG IMMEDIATE RELEASE TABLET    Take 1 tablet (5 mg total) by mouth every 6 (six) hours as needed for moderate pain.  Modified Medications   No medications on file  Discontinued Medications   AMBULATORY NON FORMULARY MEDICATION    Med Pass: Give 120 ml by mouth twice daily.    Review of Systems  Unable to perform ROS: Dementia    Filed Vitals:   10/07/15 1007  BP: 146/52  Pulse: 76  Temp: 97.3 F (36.3 C)  TempSrc: Oral  Resp: 22  Height: 5\' 7"  (1.702 m)  Weight: 151 lb (68.493 kg)    Body mass index is 23.64 kg/(m^2).  Physical Exam  Constitutional: He appears well-developed.  Frail appearing in NAD. Sitting on edge of bed  HENT:  Mouth/Throat: Oropharynx is clear and moist.  Eyes: Pupils are equal, round, and reactive to light. No scleral icterus.  Neck: Neck supple. Carotid bruit is not present. No thyromegaly present.  Cardiovascular: Normal rate, regular rhythm and intact distal pulses.  Exam reveals no gallop and no friction rub.   Murmur (1/6 SEM) heard. no distal LE swelling. No calf TTP  Pulmonary/Chest: Effort normal and breath  sounds normal. He has no wheezes. He has no rales. He exhibits no tenderness.  Abdominal: Soft. Bowel sounds are normal. He exhibits no distension, no abdominal bruit, no pulsatile midline mass and no mass. There is no tenderness. There is no rebound and no guarding.  Musculoskeletal: He exhibits edema.  Left TMA  Lymphadenopathy:    He has no cervical adenopathy.  Neurological: He is alert.  Skin: Skin is warm and dry. No rash noted.  Psychiatric: He has a normal mood and affect. His behavior is normal.     Labs reviewed: No visits with results within 3 Month(s) from this visit. Latest known visit with results is:  Nursing Home on 07/05/2015  Component Date Value Ref Range Status  . Hemoglobin 06/11/2015 11.8* 13.5 - 17.5 g/dL Final  . HCT 06/11/2015 37* 41 - 53 % Final  . Platelets 06/11/2015 361  150 - 399 K/L Final  . WBC 06/11/2015 6.8   Final  . Glucose 06/11/2015 96   Final  . BUN 06/11/2015 8  4 - 21 mg/dL Final  . Creatinine 06/11/2015 0.8  .6 - 1.3 mg/dL Final  . Potassium 06/11/2015 4.0  3.4 - 5.3 mmol/L Final  . Sodium 06/11/2015 137  137 - 147 mmol/L Final  . Triglycerides 06/11/2015 96  40 - 160 mg/dL Final  . Cholesterol 06/11/2015 114  0 - 200 mg/dL Final  . HDL 06/11/2015 33* 35 - 70 mg/dL Final  . LDL Cholesterol 06/11/2015 62   Final    No results found.   Assessment/Plan   ICD-9-CM  ICD-10-CM   1. Tobacco abuse - ongoing 305.1 Z72.0   2. PAD (peripheral artery disease) (HCC)  443.9 I73.9   3. Status post transmetatarsal amputation of left foot (HCC) V49.73 Z89.432   4. Essential hypertension 401.9 I10   5. Cardiomyopathy ([place]) 425.4 I42.9   6. H/O ETOH abuse 305.03 F10.10   7. Dementia, without behavioral disturbance 294.20 F03.90    likely due to hx Etoh abuse  8. Protein-calorie malnutrition ([place]) 263.9 E46      Check CMP  Cont current meds as ordered  PT/OT/ST as indicated  F/u with specialists as scheduled  Smoking cessation discussed and highly urged  Cont nutritional supplements as ordered  Will follow  Leon Foster S. Perlie Gold  [facility] [address] [place],  [address] [phone] Cell (Monday-Friday 8 AM - 5 PM) [phone] After 5 PM and follow prompts

## 2015-10-12 LAB — BASIC METABOLIC PANEL
BUN: 11 mg/dL (ref 4–21)
Creatinine: 0.8 mg/dL (ref 0.6–1.3)
GLUCOSE: 94 mg/dL
POTASSIUM: 4.3 mmol/L (ref 3.4–5.3)
Sodium: 138 mmol/L (ref 137–147)

## 2015-10-12 LAB — HEPATIC FUNCTION PANEL
ALK PHOS: 75 U/L (ref 25–125)
ALT: 6 U/L — AB (ref 10–40)
AST: 9 U/L — AB (ref 14–40)
BILIRUBIN, TOTAL: 0.5 mg/dL

## 2015-10-22 ENCOUNTER — Encounter: Payer: Self-pay | Admitting: Vascular Surgery

## 2015-10-28 ENCOUNTER — Other Ambulatory Visit (HOSPITAL_COMMUNITY): Payer: Medicare Other

## 2015-10-28 ENCOUNTER — Encounter (HOSPITAL_COMMUNITY): Payer: Medicare Other

## 2015-10-28 ENCOUNTER — Ambulatory Visit: Payer: Medicare Other | Admitting: Vascular Surgery

## 2015-11-15 ENCOUNTER — Encounter: Payer: Self-pay | Admitting: Internal Medicine

## 2015-11-15 NOTE — Progress Notes (Signed)
Patient ID: Leon Foster, male   DOB: May 19, 1940, 75 y.o.   MRN: [id number]     DATE:  Location:  Nursing Home Location: Leon Foster  Nursing Home Room Number: 130 B Place of Service: SNF (31)   Extended Emergency Contact Information Primary Emergency Contact: Leon Foster Address: [place]          [place], [place] Foster [place] Phone: [phone] Relation: Sister  Advanced Directive information Does patient have an advance directive?: No, Would patient like information on creating an advanced directive?: No - patient declined information, Does patient want to make changes to advanced directive?: No - Patient declined  Chief Complaint  Patient presents with  . Medical Management of Chronic Issues    Routine Visit    HPI:    Past Medical History:  Diagnosis Date  . Acute renal failure ([place]) 11/2014   from recent hospitalization notes  . Alcoholic ([place])   . AVM (arteriovenous malformation) of colon    colonoscopy 2011  [facility]   . Cardiomyopathy ([place])    from recent hospitalization notes  . Confusion   . Gangrene of digit    left toe  . Leukocytosis   . PAD (peripheral artery disease) ([place])   . Pulmonary hypertension ([place])   . Seizures ([place])     1 time seizure on 12/28/14 - seen in ED  . Tobacco dependence     Past Surgical History:  Procedure Laterality Date  . AMPUTATION Left 01/01/2015   Procedure: Left Transmetatarsal Amputation;  Surgeon: Leon Minion, MD;  Location: [facility];  Service: Orthopedics;  Laterality: Left;  . LOWER EXTREMITY ANGIOGRAM Bilateral 12/14/2014   Procedure: Lower Extremity Angiogram;  Surgeon: Leon Dutch, MD;  Location: [facility];  Service: Cardiovascular;  Laterality: Bilateral;  . PERIPHERAL VASCULAR CATHETERIZATION N/A 12/14/2014   Procedure: Abdominal Aortogram;  Surgeon: Leon Dutch, MD;  Location: [facility];  Service: Cardiovascular;  Laterality: N/A;    . PERIPHERAL VASCULAR CATHETERIZATION Left 12/14/2014   Procedure: Peripheral Vascular Intervention;  Surgeon: Leon Dutch, MD;  Location: [facility];  Service: Cardiovascular;  Laterality: Left;  SFA    Patient Care Team: Leon Cranker, Foster as PCP - General (Internal Medicine)  Social History   Social History  . Marital status: Widowed    Spouse name: N/A  . Number of children: N/A  . Years of education: N/A   Occupational History  . Not on file.   Social History Main Topics  . Smoking status: Current Every Day Smoker    Packs/day: 1.00    Years: 55.00    Types: Cigarettes  . Smokeless tobacco: Never Used     Comment: has not smoked since 11/27/2014  . Alcohol use 0.0 oz/week     Comment: 2-3 pints of wine daily  . Drug use: No  . Sexual activity: Not on file   Other Topics Concern  . Not on file   Social History Narrative  . No narrative on file     reports that he has been smoking Cigarettes.  He has a 55.00 pack-year smoking history. He has never used smokeless tobacco. He reports that he drinks alcohol. He reports that he does not use drugs.  Family History  Problem Relation Age of Onset  . Stroke Sister   . Deep vein thrombosis Sister   . Cancer Brother   . Diabetes Brother   .  Hyperlipidemia Brother   . Hypertension Brother   . Heart disease Father   . Hypertension Sister    Family Status  Relation Status  . Sister Alive  . Brother Alive  . Mother Deceased  . Father Deceased  . Sister Alive    Immunization History  Administered Date(s) Administered  . Influenza-Unspecified 01/27/2015  . PPD Test 12/15/2014  . Tdap 03/14/2014    No Known Allergies  Medications: Patient's Medications  New Prescriptions   No medications on file  Previous Medications   ACETAMINOPHEN (TYLENOL) 325 MG TABLET    Take 650 mg by mouth every 6 (six) hours as needed for headache.   BISACODYL (DULCOLAX) 10 MG SUPPOSITORY    Place 10 mg rectally daily  as needed for mild constipation or moderate constipation (if not relieved by MOM). Reported on 07/15/2015   CLOPIDOGREL (PLAVIX) 75 MG TABLET    Take 1 tablet (75 mg total) by mouth daily with breakfast.   MAGNESIUM HYDROXIDE (MILK OF MAGNESIA) 400 MG/5ML SUSPENSION    Take 30 mLs by mouth daily as needed for mild constipation.   METOPROLOL TARTRATE (LOPRESSOR) 25 MG TABLET    Take 0.5 tablets (12.5 mg total) by mouth 2 (two) times daily.   MULTIPLE VITAMIN (MULTIVITAMIN WITH MINERALS) TABS TABLET    Take 1 tablet by mouth daily.   NON FORMULARY    Med Pass 134ml by mouth two times daily   OXYCODONE (OXY IR/ROXICODONE) 5 MG IMMEDIATE RELEASE TABLET    Take 1 tablet (5 mg total) by mouth every 6 (six) hours as needed for moderate pain.  Modified Medications   No medications on file  Discontinued Medications   No medications on file    Review of Systems  Vitals:   11/15/15 0954  BP: 126/71  Pulse: 68  Resp: 18  Temp: 97.3 F (36.3 C)  TempSrc: Oral  Weight: 149 lb 9.6 oz (67.9 kg)  Height: 5\' 7"  (1.702 m)   Body mass index is 23.43 kg/m.  Physical Exam   Labs reviewed: Nursing Home on 11/15/2015  Component Date Value Ref Range Status  . Glucose 10/12/2015 94  mg/dL Final  . BUN 10/12/2015 11  4 - 21 mg/dL Final  . Creatinine 10/12/2015 0.8  0.6 - 1.3 mg/dL Final  . Potassium 10/12/2015 4.3  3.4 - 5.3 mmol/L Final  . Sodium 10/12/2015 138  137 - 147 mmol/L Final  . Alkaline Phosphatase 10/12/2015 75  25 - 125 U/L Final  . ALT 10/12/2015 6* 10 - 40 U/L Final  . AST 10/12/2015 9* 14 - 40 U/L Final  . Bilirubin, Total 10/12/2015 0.5  mg/dL Final    No results found.   Assessment/Plan    Leon Foster  [facility] [place], [place] [address] [phone] Cell (Monday-Friday 8 AM - 5 PM) [phone] After 5 PM and follow prompts   This encounter was created in error - please disregard.

## 2015-11-17 ENCOUNTER — Encounter: Payer: Self-pay | Admitting: Nurse Practitioner

## 2015-11-17 ENCOUNTER — Non-Acute Institutional Stay (SKILLED_NURSING_FACILITY): Payer: Medicare Other | Admitting: Nurse Practitioner

## 2015-11-17 DIAGNOSIS — Z720 Tobacco use: Secondary | ICD-10-CM

## 2015-11-17 DIAGNOSIS — F0390 Unspecified dementia without behavioral disturbance: Secondary | ICD-10-CM | POA: Diagnosis not present

## 2015-11-17 DIAGNOSIS — I10 Essential (primary) hypertension: Secondary | ICD-10-CM | POA: Diagnosis not present

## 2015-11-17 DIAGNOSIS — I739 Peripheral vascular disease, unspecified: Secondary | ICD-10-CM

## 2015-11-17 DIAGNOSIS — E46 Unspecified protein-calorie malnutrition: Secondary | ICD-10-CM

## 2015-11-17 DIAGNOSIS — D638 Anemia in other chronic diseases classified elsewhere: Secondary | ICD-10-CM

## 2015-11-17 NOTE — Progress Notes (Signed)
Patient ID: Leon Foster, male   DOB: 1941-01-05, 75 y.o.   MRN: [id number]    Nursing Home Location:  [place] of Service: SNF (31)  PCP: Gildardo Cranker, DO  No Known Allergies  Chief Complaint  Patient presents with  . Medical Management of Chronic Issues    Routine Visit    HPI:  Patient is a 75 y.o. male seen today at [facility] for medical management of his chronic issues. 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 Pt has been doing well in the last month, no acute issues noted. Pt walks around [place] independently and helps other residents. Pt is very involved in activities. Pt without complaints today and nursing has no concerns.  Review of Systems:  Review of Systems  Constitutional: Negative for chills, fatigue and fever.  HENT: Negative for congestion, postnasal drip, rhinorrhea and sore throat.   Respiratory: Negative for cough, shortness of breath and wheezing.   Cardiovascular: Negative for chest pain, palpitations and leg swelling.  Gastrointestinal: Negative for abdominal pain, constipation and diarrhea.  Genitourinary: Negative for dysuria, frequency and urgency.  Musculoskeletal: Negative for arthralgias and myalgias.  Skin: Negative.   Neurological: Negative for dizziness, light-headedness and headaches.  Psychiatric/Behavioral: Positive for confusion. Negative for agitation and self-injury. The patient is not nervous/anxious.     Past Medical History:  Diagnosis Date  . Acute renal failure ([place]) 11/2014   from recent hospitalization notes  . Alcoholic ([place])   . AVM (arteriovenous malformation) of colon    colonoscopy 2011 [facility]   . Cardiomyopathy ([place])    from recent hospitalization notes  . Confusion   . Gangrene of digit    left toe  . Leukocytosis   . PAD (peripheral artery disease) ([place])   . Pulmonary hypertension ([place])   .  Seizures ([place])     1 time seizure on 12/28/14 - seen in ED  . Tobacco dependence    Past Surgical History:  Procedure Laterality Date  . AMPUTATION Left 01/01/2015   Procedure: Left Transmetatarsal Amputation;  Surgeon: Newt Minion, MD;  Location: [facility];  Service: Orthopedics;  Laterality: Left;  . LOWER EXTREMITY ANGIOGRAM Bilateral 12/14/2014   Procedure: Lower Extremity Angiogram;  Surgeon: Elam Dutch, MD;  Location: [place] [facility];  Service: Cardiovascular;  Laterality: Bilateral;  . PERIPHERAL VASCULAR CATHETERIZATION N/A 12/14/2014   Procedure: Abdominal Aortogram;  Surgeon: Elam Dutch, MD;  Location: [place] [facility];  Service: Cardiovascular;  Laterality: N/A;  . PERIPHERAL VASCULAR CATHETERIZATION Left 12/14/2014   Procedure: Peripheral Vascular Intervention;  Surgeon: Elam Dutch, MD;  Location: [facility];  Service: Cardiovascular;  Laterality: Left;  SFA   Social History:   reports that he has been smoking Cigarettes.  He has a 55.00 pack-year smoking history. He has never used smokeless tobacco. He reports that he drinks alcohol. He reports that he does not use drugs.  Family History  Problem Relation Age of Onset  . Stroke Sister   . Deep vein thrombosis Sister   . Cancer Brother   . Diabetes Brother   . Hyperlipidemia Brother   . Hypertension Brother   . Heart disease Father   . Hypertension Sister     Medications: Patient's Medications  New Prescriptions   No medications on file  Previous Medications   ACETAMINOPHEN (TYLENOL) 325 MG  TABLET    Take 650 mg by mouth every 6 (six) hours as needed for headache.   BISACODYL (DULCOLAX) 10 MG SUPPOSITORY    Place 10 mg rectally daily as needed for mild constipation or moderate constipation (if not relieved by MOM). Reported on 07/15/2015   CLOPIDOGREL (PLAVIX) 75 MG TABLET    Take 1 tablet (75 mg total) by mouth daily with breakfast.   MAGNESIUM HYDROXIDE (MILK OF MAGNESIA) 400 MG/5ML  SUSPENSION    Take 30 mLs by mouth daily as needed for mild constipation.   METOPROLOL TARTRATE (LOPRESSOR) 25 MG TABLET    Take 0.5 tablets (12.5 mg total) by mouth 2 (two) times daily.   MULTIPLE VITAMIN (MULTIVITAMIN WITH MINERALS) TABS TABLET    Take 1 tablet by mouth daily.   NON FORMULARY    Med Pass 1109ml by mouth two times daily   OXYCODONE (OXY IR/ROXICODONE) 5 MG IMMEDIATE RELEASE TABLET    Take 1 tablet (5 mg total) by mouth every 6 (six) hours as needed for moderate pain.  Modified Medications   No medications on file  Discontinued Medications   No medications on file     Physical Exam: Vitals:   11/17/15 0940  BP: 136/74  Pulse: 76  Resp: 18  Temp: 97.4 F (36.3 C)  TempSrc: Oral  Weight: 150 lb (68 kg)  Height: 5\' 7"  (1.702 m)    Physical Exam  Constitutional: No distress.  Frail elderly male   HENT:  Head: Normocephalic and atraumatic.  Poor detention   Eyes: Pupils are equal, round, and reactive to light.  Neck: Normal range of motion. Neck supple.  Cardiovascular: Normal rate, regular rhythm and normal heart sounds.   Pulmonary/Chest: Effort normal and breath sounds normal.  Abdominal: Soft. Bowel sounds are normal.  Musculoskeletal: He exhibits no edema or tenderness.  Neurological: He is alert.  Skin: Skin is warm and dry. He is not diaphoretic.    Labs reviewed: Basic Metabolic Panel:  Recent Labs  11/28/14 0249 11/28/14 1529 11/29/14 0329 11/30/14 0305  12/18/14 1239  12/28/14 1156 01/17/15 1501 01/17/15 1504 06/11/15 10/12/15  NA 138 140 139 141  < > 139  < > 140 138  --  137 138  K 3.4* 3.3* 4.2 3.7  < > 4.2  < > 4.0 4.3  --  4.0 4.3  CL 105 108 109 114*  < > 106  --  104 103  --   --   --   CO2 22 20* 20* 18*  < > 27  --  29 25  --   --   --   GLUCOSE 91 121* 84 95  < > 100*  --  90 97  --   --   --   BUN 21* 21* 21* 22*  < > 8  < > 7 8  --  8 11  CREATININE 6.91* 6.88* 6.95* 7.02*  < > 1.34*  < > 1.04 1.03  --  0.8 0.8  CALCIUM  7.6* 6.9* 7.7* 7.7*  < > 8.9  --  9.2 9.1  --   --   --   MG  --   --  2.0 1.9  --   --   --   --   --  1.7  --   --   PHOS 4.4 3.9 4.5  --   --   --   --   --   --   --   --   --   < > =  values in this interval not displayed. Liver Function Tests:  Recent Labs  12/14/14 0343 12/18/14 1239  12/22/14 0656 12/28/14 1156 10/12/15  AST 17 18  < > 11* 13* 9*  ALT 8* 10*  < > 8* 6* 6*  ALKPHOS 46 51  < > 52 60 75  BILITOT 0.4 0.2*  --   --  0.5  --   PROT 6.6 7.2  --   --  7.0  --   ALBUMIN 2.3* 2.6*  --   --  2.6*  --   < > = values in this interval not displayed. No results for input(s): LIPASE, AMYLASE in the last 8760 hours.  Recent Labs  11/27/14 1043  AMMONIA 26   CBC:  Recent Labs  12/18/14 1239  12/28/14 1156 01/17/15 1501 06/11/15  WBC 5.8  < > 10.7* 9.5 6.8  NEUTROABS 2.4  --  7.1 6.8  --   HGB 9.8*  < > 9.5* 9.4* 11.8*  HCT 29.9*  < > 28.6* 29.1* 37*  MCV 94.0  --  91.7 88.2  --   PLT 405*  < > 388 476* 361  < > = values in this interval not displayed. TSH:  Recent Labs  11/27/14 1842  TSH 1.420   A1C: Lab Results  Component Value Date   HGBA1C 5.0 11/27/2014   Lipid Panel:  Recent Labs  06/11/15  CHOL 114  HDL 33*  LDLCALC 62  TRIG 96    Radiological Exams: No results found.   Assessment/Plan 1. Essential hypertension Blood pressure remains stable, conts on lopressor 12.5 mg BID  2. Dementia, without behavioral disturbance Stable, without acute changes in cognitive or functional status  3. Protein-calorie malnutrition ([place]) -weight trending up. Pt with good PO inake, conts on supplements BID  4. Tobacco abuse conts to smoke, encourages cessation  5. PAD (peripheral artery disease) (HCC) s/p left transmetatarsal amputation, conts on plavix 75 mg daily; oxycodone 5 mg every 6 hours as needed for pain. Pt is still smoking cigarettes .   6. Anemia, chronic disease Will follow up CBC at this time.    Leon American. Harle Foster  [facility] [phone] 8 am - 5 pm) [phone] (after hours)

## 2015-12-02 ENCOUNTER — Ambulatory Visit: Payer: Medicare Other | Admitting: Vascular Surgery

## 2015-12-02 ENCOUNTER — Ambulatory Visit (HOSPITAL_COMMUNITY): Payer: Medicare Other

## 2015-12-17 ENCOUNTER — Non-Acute Institutional Stay (SKILLED_NURSING_FACILITY): Payer: Medicare Other | Admitting: Nurse Practitioner

## 2015-12-17 ENCOUNTER — Encounter: Payer: Self-pay | Admitting: Nurse Practitioner

## 2015-12-17 DIAGNOSIS — I10 Essential (primary) hypertension: Secondary | ICD-10-CM

## 2015-12-17 DIAGNOSIS — D638 Anemia in other chronic diseases classified elsewhere: Secondary | ICD-10-CM | POA: Diagnosis not present

## 2015-12-17 DIAGNOSIS — F0390 Unspecified dementia without behavioral disturbance: Secondary | ICD-10-CM | POA: Diagnosis not present

## 2015-12-17 DIAGNOSIS — I739 Peripheral vascular disease, unspecified: Secondary | ICD-10-CM | POA: Diagnosis not present

## 2015-12-17 DIAGNOSIS — Z720 Tobacco use: Secondary | ICD-10-CM

## 2015-12-17 NOTE — Progress Notes (Signed)
Patient ID: Leon Foster, male   DOB: 1941-02-06, 75 y.o.   MRN: [id number]    Nursing Home Location:  [facility]  Place of Service: SNF (31)  PCP: [name], MD  No Known Allergies  Chief Complaint  Patient presents with  . Medical Management of Chronic Issues    Routine Visit    HPI:  Patient is a 75 y.o. male seen today at [facility] [place] for medical management of his chronic issues. 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 Pt has been doing well in the last month. conts to be active walking around facility. Pt goes outside to smoke despite education on smoking cessation. Pt denies pain.  Pt helps other residents in facility and participates in activities.  Pt has no complaints. Staff without concerns.  Review of Systems:  Review of Systems  Constitutional: Negative for chills, fatigue and fever.  HENT: Negative for congestion, postnasal drip, rhinorrhea and sore throat.   Respiratory: Negative for cough, shortness of breath and wheezing.   Cardiovascular: Negative for chest pain, palpitations and leg swelling.  Gastrointestinal: Negative for abdominal pain, constipation and diarrhea.  Genitourinary: Negative for dysuria, frequency and urgency.  Musculoskeletal: Negative for arthralgias and myalgias.  Skin: Negative.   Neurological: Negative for dizziness, light-headedness and headaches.  Psychiatric/Behavioral: Positive for confusion. Negative for agitation and self-injury. The patient is not nervous/anxious.     Past Medical History:  Diagnosis Date  . Acute renal failure ([place]) 11/2014   from recent hospitalization notes  . Alcoholic ([place])   . AVM (arteriovenous malformation) of colon    colonoscopy 2011 [facility]   . Cardiomyopathy ([place])    from recent hospitalization notes  . Confusion   . Gangrene of digit    left toe  . Leukocytosis   . PAD (peripheral  artery disease) ([place])   . Pulmonary hypertension ([place])   . Seizures ([place])     1 time seizure on 12/28/14 - seen in ED  . Tobacco dependence    Past Surgical History:  Procedure Laterality Date  . AMPUTATION Left 01/01/2015   Procedure: Left Transmetatarsal Amputation;  Surgeon: Newt Minion, MD;  Location: [place];  Service: Orthopedics;  Laterality: Left;  . LOWER EXTREMITY ANGIOGRAM Bilateral 12/14/2014   Procedure: Lower Extremity Angiogram;  Surgeon: Elam Dutch, MD;  Location: [place] [facility];  Service: Cardiovascular;  Laterality: Bilateral;  . PERIPHERAL VASCULAR CATHETERIZATION N/A 12/14/2014   Procedure: Abdominal Aortogram;  Surgeon: Elam Dutch, MD;  Location: [place] [facility];  Service: Cardiovascular;  Laterality: N/A;  . PERIPHERAL VASCULAR CATHETERIZATION Left 12/14/2014   Procedure: Peripheral Vascular Intervention;  Surgeon: Elam Dutch, MD;  Location: [place] [facility];  Service: Cardiovascular;  Laterality: Left;  SFA   Social History:   reports that he has been smoking Cigarettes.  He has a 55.00 pack-year smoking history. He has never used smokeless tobacco. He reports that he drinks alcohol. He reports that he does not use drugs.  Family History  Problem Relation Age of Onset  . Stroke Sister   . Deep vein thrombosis Sister   . Cancer Brother   . Diabetes Brother   . Hyperlipidemia Brother   . Hypertension Brother   . Heart disease Father   . Hypertension Sister     Medications: Patient's Medications  New Prescriptions   No medications  on file  Previous Medications   ACETAMINOPHEN (TYLENOL) 325 MG TABLET    Take 650 mg by mouth every 6 (six) hours as needed for headache.   BISACODYL (DULCOLAX) 10 MG SUPPOSITORY    Place 10 mg rectally daily as needed for mild constipation or moderate constipation (if not relieved by MOM). Reported on 07/15/2015   CLOPIDOGREL (PLAVIX) 75 MG TABLET    Take 1 tablet (75 mg total) by mouth daily with breakfast.     MAGNESIUM HYDROXIDE (MILK OF MAGNESIA) 400 MG/5ML SUSPENSION    Take 30 mLs by mouth daily as needed for mild constipation.   METOPROLOL TARTRATE (LOPRESSOR) 25 MG TABLET    Take 0.5 tablets (12.5 mg total) by mouth 2 (two) times daily.   MULTIPLE VITAMIN (MULTIVITAMIN WITH MINERALS) TABS TABLET    Take 1 tablet by mouth daily.   NON FORMULARY    Med Pass 129ml by mouth two times daily   OXYCODONE (OXY IR/ROXICODONE) 5 MG IMMEDIATE RELEASE TABLET    Take 1 tablet (5 mg total) by mouth every 6 (six) hours as needed for moderate pain.  Modified Medications   No medications on file  Discontinued Medications   No medications on file     Physical Exam: Vitals:   12/17/15 1030  BP: 135/88  Pulse: 84  Resp: 20  Temp: 97.7 F (36.5 C)  Weight: 148 lb 6.4 oz (67.3 kg)  Height: 5\' 7"  (1.702 m)    Physical Exam  Constitutional: No distress.  Frail elderly male   HENT:  Head: Normocephalic and atraumatic.  Poor detention   Eyes: Pupils are equal, round, and reactive to light.  Neck: Normal range of motion. Neck supple.  Cardiovascular: Normal rate, regular rhythm and normal heart sounds.   Pulmonary/Chest: Effort normal and breath sounds normal.  Abdominal: Soft. Bowel sounds are normal.  Musculoskeletal: He exhibits no edema or tenderness.  Neurological: He is alert.  Skin: Skin is warm and dry. He is not diaphoretic.    Labs reviewed: Basic Metabolic Panel:  Recent Labs  12/18/14 1239  12/28/14 1156 01/17/15 1501 01/17/15 1504 06/11/15 10/12/15  NA 139  < > 140 138  --  137 138  K 4.2  < > 4.0 4.3  --  4.0 4.3  CL 106  --  104 103  --   --   --   CO2 27  --  29 25  --   --   --   GLUCOSE 100*  --  90 97  --   --   --   BUN 8  < > 7 8  --  8 11  CREATININE 1.34*  < > 1.04 1.03  --  0.8 0.8  CALCIUM 8.9  --  9.2 9.1  --   --   --   MG  --   --   --   --  1.7  --   --   < > = values in this interval not displayed. Liver Function Tests:  Recent Labs  12/18/14 1239   12/22/14 0656 12/28/14 1156 10/12/15  AST 18  < > 11* 13* 9*  ALT 10*  < > 8* 6* 6*  ALKPHOS 51  < > 52 60 75  BILITOT 0.2*  --   --  0.5  --   PROT 7.2  --   --  7.0  --   ALBUMIN 2.6*  --   --  2.6*  --   < > =  values in this interval not displayed. No results for input(s): LIPASE, AMYLASE in the last 8760 hours. No results for input(s): AMMONIA in the last 8760 hours. CBC:  Recent Labs  12/18/14 1239  12/28/14 1156 01/17/15 1501 06/11/15  WBC 5.8  < > 10.7* 9.5 6.8  NEUTROABS 2.4  --  7.1 6.8  --   HGB 9.8*  < > 9.5* 9.4* 11.8*  HCT 29.9*  < > 28.6* 29.1* 37*  MCV 94.0  --  91.7 88.2  --   PLT 405*  < > 388 476* 361  < > = values in this interval not displayed. TSH: No results for input(s): TSH in the last 8760 hours. A1C: Lab Results  Component Value Date   HGBA1C 5.0 11/27/2014   Lipid Panel:  Recent Labs  06/11/15  CHOL 114  HDL 33*  LDLCALC 62  TRIG 96    Radiological Exams: No results found.   Assessment/Plan 1. Anemia, chronic disease Will follow up CBC  2. PAD (peripheral artery disease) (HCC) Stable, no noted pain, conts on plavix   3. Dementia, without behavioral disturbance Stable, no acute changes in cognitive status  4. Tobacco abuse conts to smoke despite education, does not wish to quit   5. Essential hypertension -blood pressure stable. conts on lopressor  [employer]. Harle Battiest  [facility] [phone] 8 am - 5 pm) [phone] (after hours)

## 2015-12-19 LAB — CBC AND DIFFERENTIAL
HEMATOCRIT: 37 % — AB (ref 41–53)
HEMOGLOBIN: 12.2 g/dL — AB (ref 13.5–17.5)
PLATELETS: 329 10*3/uL (ref 150–399)
WBC: 7.6 10^3/mL

## 2015-12-29 ENCOUNTER — Encounter: Payer: Self-pay | Admitting: Podiatry

## 2015-12-29 ENCOUNTER — Ambulatory Visit (INDEPENDENT_AMBULATORY_CARE_PROVIDER_SITE_OTHER): Payer: Medicare Other | Admitting: Podiatry

## 2015-12-29 VITALS — BP 105/71 | HR 65 | Resp 18

## 2015-12-29 DIAGNOSIS — B351 Tinea unguium: Secondary | ICD-10-CM | POA: Diagnosis not present

## 2015-12-29 DIAGNOSIS — M79674 Pain in right toe(s): Secondary | ICD-10-CM

## 2015-12-29 NOTE — Patient Instructions (Signed)
   Subjective: This patient presents for scheduled visit complaining of uncomfortable toenails and right foot when walking wearing shoes and requests toenail debridement.   Objective: Vascular: DP pulses 0/4 bilaterally PT pulses 0/4 bilaterally  Neurological: Sensation to 10 g monofilament wire intact 5/5 bilaterally Vibratory sensation nonreactive bilaterally Ankle reflex equal and reactive bilaterally  Dermatological: Open skin lesions bilaterally Toenails 1-5 are elongated, hypertrophic, deformed and tender to direct palpation  Musculoskeletal: Transmetatarsal amputation left foot well-healed incision site HAV right Hammertoe second right Ankle motion 90, bilaterally  Assessment: Absent pedal pulses consistent with known history of peripheral arterial disease Transmetatarsal amputation left Symptomatic onychomycosis 1-5 right HAV right Hammertoe second right  Plan: Debridement toenails 1-5 right mechanically an electrical without a bleeding  Reappoint 3 months Gean Birchwood DPM [facility] 12/29/2015

## 2015-12-29 NOTE — Progress Notes (Signed)
Patient ID: Leon Foster, male   DOB: 27-Aug-1940, 75 y.o.   MRN: [id number]   Subjective: This patient presents for scheduled visit complaining of uncomfortable toenails and right foot when walking wearing shoes and requests toenail debridement.   Objective: Vascular: DP pulses 0/4 bilaterally PT pulses 0/4 bilaterally  Neurological: Sensation to 10 g monofilament wire intact 5/5 bilaterally Vibratory sensation nonreactive bilaterally Ankle reflex equal and reactive bilaterally  Dermatological: Open skin lesions bilaterally Toenails 1-5 are elongated, hypertrophic, deformed and tender to direct palpation  Musculoskeletal: Transmetatarsal amputation left foot well-healed incision site HAV right Hammertoe second right Ankle motion 90, bilaterally  Assessment: Absent pedal pulses consistent with known history of peripheral arterial disease Transmetatarsal amputation left Symptomatic onychomycosis 1-5 right HAV right Hammertoe second right  Plan: Debridement toenails 1-5 right mechanically an electrical without a bleeding  Reappoint 3 months

## 2015-12-30 ENCOUNTER — Encounter: Payer: Self-pay | Admitting: Internal Medicine

## 2015-12-30 DIAGNOSIS — Z8619 Personal history of other infectious and parasitic diseases: Secondary | ICD-10-CM | POA: Insufficient documentation

## 2015-12-30 NOTE — Progress Notes (Signed)
Patient ID: STERLIN CLAUSS, male   DOB: March 03, 1941, 75 y.o.   MRN: [id number]   Subjective: This patient presents for scheduled visit complaining of uncomfortable toenails and right foot when walking wearing shoes and requests toenail debridement. Patient's brother is present in the treatment room  Objective: Vascular: DP pulses 0/4 bilaterally PT pulses 0/4 bilaterally  Neurological: Sensation to 10 g monofilament wire intact 5/5 bilaterally Vibratory sensation nonreactive bilaterally Ankle reflex equal and reactive bilaterally  Dermatological: Open skin lesions bilaterally Toenails 1-5 are elongated, hypertrophic, deformed and tender to direct palpation  Musculoskeletal: Transmetatarsal amputation left foot well-healed incision site HAV right Hammertoe second right Ankle motion 90, bilaterally  Assessment: Absent pedal pulses consistent with known history of peripheral arterial disease Transmetatarsal amputation left Symptomatic onychomycosis 1-5 right HAV right Hammertoe second right  Plan: Today review the results examination with patient sister and treatment room. I made aware that circulation was very limited in his right foot recommended toenails debrided professionally. Patient consents  The toenails 1-5 right are debrided mechanically and electronically without any bleeding  Reappoint 3 months

## 2016-01-28 ENCOUNTER — Non-Acute Institutional Stay (SKILLED_NURSING_FACILITY): Payer: Medicare Other | Admitting: Nurse Practitioner

## 2016-01-28 ENCOUNTER — Encounter: Payer: Self-pay | Admitting: Nurse Practitioner

## 2016-01-28 DIAGNOSIS — F0390 Unspecified dementia without behavioral disturbance: Secondary | ICD-10-CM | POA: Diagnosis not present

## 2016-01-28 DIAGNOSIS — I739 Peripheral vascular disease, unspecified: Secondary | ICD-10-CM

## 2016-01-28 DIAGNOSIS — D638 Anemia in other chronic diseases classified elsewhere: Secondary | ICD-10-CM | POA: Diagnosis not present

## 2016-01-28 DIAGNOSIS — I10 Essential (primary) hypertension: Secondary | ICD-10-CM | POA: Diagnosis not present

## 2016-01-28 DIAGNOSIS — Z720 Tobacco use: Secondary | ICD-10-CM | POA: Diagnosis not present

## 2016-01-28 NOTE — Progress Notes (Signed)
Patient ID: Leon Foster, male   DOB: [birth year], 75 y.o.   MRN: [id number]    Nursing Home Location:  [facility]  Place of Service: SNF (31)  PCP: [name], MD  No Known Allergies  Chief Complaint  Patient presents with  . Medical Management of Chronic Issues    Routine Visit    HPI:  Patient is a 75 y.o. male seen today at [facility] for medical management of his chronic issues. 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 Pt had followed up with podiatrist 9/13 with debridement of toenails and now nails do not bother him when he wears his shoes. conts to smoke. Does not wish to stop.  Walks around facility and is very improved in activities.  Pushes other residents to [employer].  Pt has vascular follow up schedule on 10/19, will get ABIs at that time. Denies any pain in legs when walking. No pain noted.    Review of Systems:  Review of Systems  Constitutional: Negative for chills, fatigue and fever.  HENT: Negative for congestion, postnasal drip, rhinorrhea and sore throat.   Respiratory: Negative for cough, shortness of breath and wheezing.   Cardiovascular: Negative for chest pain, palpitations and leg swelling.  Gastrointestinal: Negative for abdominal pain, constipation and diarrhea.  Genitourinary: Negative for dysuria, frequency and urgency.  Musculoskeletal: Negative for arthralgias and myalgias.  Skin: Negative.   Neurological: Negative for dizziness, light-headedness and headaches.  Psychiatric/Behavioral: Positive for confusion. Negative for agitation and self-injury. The patient is not nervous/anxious.     Past Medical History:  Diagnosis Date  . Acute renal failure ([place]) 11/2014   from recent hospitalization notes  . Alcoholic ([place])   . AVM (arteriovenous malformation) of colon    colonoscopy 2011 [facility]   . Cardiomyopathy ([place])    from recent  hospitalization notes  . Confusion   . Gangrene of digit    left toe  . Leukocytosis   . PAD (peripheral artery disease) ([place])   . Pulmonary hypertension   . Seizures ([place])     1 time seizure on 12/28/14 - seen in ED  . Tobacco dependence    Past Surgical History:  Procedure Laterality Date  . AMPUTATION Left 01/01/2015   Procedure: Left Transmetatarsal Amputation;  Surgeon: Newt Minion, MD;  Location: [place];  Service: Orthopedics;  Laterality: Left;  . LOWER EXTREMITY ANGIOGRAM Bilateral 12/14/2014   Procedure: Lower Extremity Angiogram;  Surgeon: Elam Dutch, MD;  Location: [place] [facility];  Service: Cardiovascular;  Laterality: Bilateral;  . PERIPHERAL VASCULAR CATHETERIZATION N/A 12/14/2014   Procedure: Abdominal Aortogram;  Surgeon: Elam Dutch, MD;  Location: [place] [facility];  Service: Cardiovascular;  Laterality: N/A;  . PERIPHERAL VASCULAR CATHETERIZATION Left 12/14/2014   Procedure: Peripheral Vascular Intervention;  Surgeon: Elam Dutch, MD;  Location: [place] [facility];  Service: Cardiovascular;  Laterality: Left;  SFA   Social History:   reports that he has been smoking Cigarettes.  He has a 55.00 pack-year smoking history. He has never used smokeless tobacco. He reports that he drinks alcohol. He reports that he does not use drugs.  Family History  Problem Relation Age of Onset  . Stroke Sister   . Deep vein thrombosis Sister   . Cancer Brother   . Diabetes Brother   . Hyperlipidemia Brother   . Hypertension  Brother   . Heart disease Father   . Hypertension Sister     Medications: Patient's Medications  New Prescriptions   No medications on file  Previous Medications   ACETAMINOPHEN (TYLENOL) 325 MG TABLET    Take 650 mg by mouth every 6 (six) hours as needed for headache.   BISACODYL (DULCOLAX) 10 MG SUPPOSITORY    Place 10 mg rectally daily as needed for mild constipation or moderate constipation (if not relieved by MOM). Reported on  07/15/2015   CLOPIDOGREL (PLAVIX) 75 MG TABLET    Take 1 tablet (75 mg total) by mouth daily with breakfast.   MAGNESIUM HYDROXIDE (MILK OF MAGNESIA) 400 MG/5ML SUSPENSION    Take 30 mLs by mouth daily as needed for mild constipation.   METOPROLOL TARTRATE (LOPRESSOR) 25 MG TABLET    Take 0.5 tablets (12.5 mg total) by mouth 2 (two) times daily.   MULTIPLE VITAMIN (MULTIVITAMIN WITH MINERALS) TABS TABLET    Take 1 tablet by mouth daily.   NON FORMULARY    Med Pass 156ml by mouth two times daily   OXYCODONE (OXY IR/ROXICODONE) 5 MG IMMEDIATE RELEASE TABLET    Take 1 tablet (5 mg total) by mouth every 6 (six) hours as needed for moderate pain.  Modified Medications   No medications on file  Discontinued Medications   No medications on file     Physical Exam: Vitals:   01/28/16 1305  BP: 136/78  Pulse: 72  Resp: (!) 22  Temp: 99.6 F (37.6 C)  Weight: 149 lb (67.6 kg)  Height: 5\' 7"  (1.702 m)    Physical Exam  Constitutional: No distress.  Frail elderly male   HENT:  Head: Normocephalic and atraumatic.  Poor detention   Eyes: Pupils are equal, round, and reactive to light.  Neck: Normal range of motion. Neck supple.  Cardiovascular: Normal rate, regular rhythm and normal heart sounds.   Pulmonary/Chest: Effort normal and breath sounds normal.  Abdominal: Soft. Bowel sounds are normal.  Musculoskeletal: He exhibits no edema or tenderness.  Neurological: He is alert.  Skin: Skin is warm and dry. He is not diaphoretic.  Psychiatric: His affect is blunt.    Labs reviewed: Basic Metabolic Panel:  Recent Labs  06/11/15 10/12/15  NA 137 138  K 4.0 4.3  BUN 8 11  CREATININE 0.8 0.8   Liver Function Tests:  Recent Labs  10/12/15  AST 9*  ALT 6*  ALKPHOS 75   No results for input(s): LIPASE, AMYLASE in the last 8760 hours. No results for input(s): AMMONIA in the last 8760 hours. CBC:  Recent Labs  06/11/15 12/19/15  WBC 6.8 7.6  HGB 11.8* 12.2*  HCT 37* 37*    PLT 361 329   TSH: No results for input(s): TSH in the last 8760 hours. A1C: Lab Results  Component Value Date   HGBA1C 5.0 11/27/2014   Lipid Panel:  Recent Labs  06/11/15  CHOL 114  HDL 33*  LDLCALC 62  TRIG 96    Radiological Exams: No results found.   Assessment/Plan 1. PAD (peripheral artery disease) (HCC) Stable without leg pain, conts on plavix. Has follow up with vascular for follow up ABIs   2. Essential hypertension Blood pressure stable, conts on lopressor 12.5 mg BID   3. Dementia without behavioral disturbance, unspecified dementia type Mild memory impairment, not currently on medications at this time  4. Anemia, chronic disease Stable, will monitor h/h   5. Tobacco abuse Encouraged smoking cessation  however not motivated to quit smoking   Reg Bircher K. Harle Battiest  [facility] [phone] 8 am - 5 pm) [phone] (after hours)

## 2016-01-31 ENCOUNTER — Encounter: Payer: Self-pay | Admitting: Vascular Surgery

## 2016-02-03 ENCOUNTER — Ambulatory Visit (HOSPITAL_COMMUNITY)
Admission: RE | Admit: 2016-02-03 | Discharge: 2016-02-03 | Disposition: A | Discharge status: Home / Self Care | Payer: Medicare Other | Source: Ambulatory Visit | Attending: Vascular Surgery | Admitting: Vascular Surgery

## 2016-02-03 ENCOUNTER — Ambulatory Visit (INDEPENDENT_AMBULATORY_CARE_PROVIDER_SITE_OTHER): Payer: Medicare Other | Admitting: Vascular Surgery

## 2016-02-03 ENCOUNTER — Encounter: Payer: Self-pay | Admitting: Vascular Surgery

## 2016-02-03 VITALS — BP 107/68 | HR 57 | Temp 97.5°F | Resp 16 | Ht 67.0 in | Wt 149.5 lb

## 2016-02-03 DIAGNOSIS — I10 Essential (primary) hypertension: Secondary | ICD-10-CM | POA: Insufficient documentation

## 2016-02-03 DIAGNOSIS — Z720 Tobacco use: Secondary | ICD-10-CM | POA: Insufficient documentation

## 2016-02-03 DIAGNOSIS — I739 Peripheral vascular disease, unspecified: Secondary | ICD-10-CM

## 2016-02-03 DIAGNOSIS — Z95828 Presence of other vascular implants and grafts: Secondary | ICD-10-CM | POA: Insufficient documentation

## 2016-02-03 NOTE — Progress Notes (Signed)
Patient is 75 year old male who returns for follow-up today. He underwent a left superficial femoral artery stenting procedure on 12/14/2014. He reports no pain in left foot. He subsequently underwent a transmetatarsal amputation of the left foot by Dr. Sharol Given. This has now completely healed. The patient is currently on Plavix. He is currently residing in a skilled nursing facility. He is now walking.    Current Outpatient Prescriptions on File Prior to Visit  Medication Sig Dispense Refill  . acetaminophen (TYLENOL) 325 MG tablet Take 650 mg by mouth every 6 (six) hours as needed for headache.    . clopidogrel (PLAVIX) 75 MG tablet Take 1 tablet (75 mg total) by mouth daily with breakfast.    . metoprolol tartrate (LOPRESSOR) 25 MG tablet Take 0.5 tablets (12.5 mg total) by mouth 2 (two) times daily.    . Multiple Vitamin (MULTIVITAMIN WITH MINERALS) TABS tablet Take 1 tablet by mouth daily.    . bisacodyl (DULCOLAX) 10 MG suppository Place 10 mg rectally daily as needed for mild constipation or moderate constipation (if not relieved by MOM). Reported on 07/15/2015    . magnesium hydroxide (MILK OF MAGNESIA) 400 MG/5ML suspension Take 30 mLs by mouth daily as needed for mild constipation.    . NON FORMULARY Med Pass 165ml by mouth two times daily    . oxyCODONE (OXY IR/ROXICODONE) 5 MG immediate release tablet Take 1 tablet (5 mg total) by mouth every 6 (six) hours as needed for moderate pain. (Patient not taking: Reported on 02/03/2016) 30 tablet 0   No current facility-administered medications on file prior to visit.    Review of systems: He denies shortness of breath. He denies chest pain.   Physical exam:    Vitals:   02/03/16 1150  BP: 107/68  Pulse: (!) 57  Resp: 16  Temp: 97.5 F (36.4 C)  TempSrc: Oral  SpO2: 98%  Weight: 149 lb 8 oz (67.8 kg)  Height: 5\' 7"  (1.702 m)     Extremities: 2+ femoral pulses bilaterally absent popliteal and pedal pulses, healed transmetatarsal  amputation   Data: Patient had bilateral ABIs performed today which were 0.81 on the left 0.80 on the right duplex ultrasound of the patient's left SFA stents there is this is widely patent. He has one-vessel runoff via the peroneal artery.   Assessment:  The patient's left superficial femoral artery stent is widely patent with one-vessel runoff via the peroneal artery.   Plan: Patient will follow-up with me in 1 year for repeat duplex exam and ABIs.    Ruta Hinds, MD [facility] Office: [phone] Pager: [phone]

## 2016-02-08 ENCOUNTER — Ambulatory Visit (INDEPENDENT_AMBULATORY_CARE_PROVIDER_SITE_OTHER): Payer: Medicare Other | Admitting: Nurse Practitioner

## 2016-02-08 ENCOUNTER — Telehealth: Payer: Self-pay

## 2016-02-08 ENCOUNTER — Encounter: Payer: Self-pay | Admitting: Nurse Practitioner

## 2016-02-08 VITALS — BP 102/66 | HR 68 | Ht 68.75 in | Wt 143.0 lb

## 2016-02-08 DIAGNOSIS — Z7901 Long term (current) use of anticoagulants: Secondary | ICD-10-CM | POA: Diagnosis not present

## 2016-02-08 DIAGNOSIS — Z86010 Personal history of colonic polyps: Secondary | ICD-10-CM

## 2016-02-08 MED ORDER — NA SULFATE-K SULFATE-MG SULF 17.5-3.13-1.6 GM/177ML PO SOLN: 1.0000 | Freq: Once | ORAL | 0 refills | Status: AC

## 2016-02-08 NOTE — Progress Notes (Signed)
HPI: Patient is a 75 year old male with multiple medical problems including dementia. He was formerly followed by Dr. Deatra Ina for history of adenomatous colon polyps. Patient lives at [facility]. He has memory problems and is accompanied by sister today. Colonoscopy with polypectomy March 2010. Multiple polyps found some of which could not be removed because of the poor prep.  Repeat colonoscopy was done a few days later at which time the remaining polyps were successfully removed. 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 He had a follow-up colonoscopy one year later with findings of a cecal AVM and diverticula in the ascending colon but no recurrent polyps. Follow-up colonoscopy recommended at 3 years but this was not done for unknown reasons.  Patient is here with his sister to make arrangements for follow-up colonoscopy. This sister is able to provide very limited information about the patient . Patient denies bowel changes, no blood in stool. No abdominal pain.   Past Medical History:  Diagnosis Date  . Acute renal failure ([place]) 11/2014   from recent hospitalization notes  . Alcoholic ([place])   . AVM (arteriovenous malformation) of colon    colonoscopy 2011 [facility]   . Cardiomyopathy ([place])    from recent hospitalization notes  . Gangrene of digit    left toe  . PAD (peripheral artery disease) ([place])   . Pulmonary hypertension   . Seizures ([place])     1 time seizure on 12/28/14 - seen in ED  . Tobacco dependence Dementia     Past Surgical History:  Procedure Laterality Date  . AMPUTATION Left 01/01/2015   Procedure: Left Transmetatarsal Amputation;  Surgeon: Newt Minion, MD;  Location: [place];  Service: Orthopedics;  Laterality: Left;  . LOWER EXTREMITY ANGIOGRAM Bilateral 12/14/2014   Procedure: Lower Extremity Angiogram;  Surgeon: Elam Dutch, MD;  Location: [place] [facility];  Service: Cardiovascular;  Laterality: Bilateral;  .  PERIPHERAL VASCULAR CATHETERIZATION N/A 12/14/2014   Procedure: Abdominal Aortogram;  Surgeon: Elam Dutch, MD;  Location: [place] [facility];  Service: Cardiovascular;  Laterality: N/A;  . PERIPHERAL VASCULAR CATHETERIZATION Left 12/14/2014   Procedure: Peripheral Vascular Intervention;  Surgeon: Elam Dutch, MD;  Location: [place] [facility];  Service: Cardiovascular;  Laterality: Left;  SFA   Family History  Problem Relation Age of Onset  . Stroke Sister   . Deep vein thrombosis Sister   . Cancer Brother   . Diabetes Brother   . Hyperlipidemia Brother   . Hypertension Brother   . Heart disease Father   . Hypertension Sister    Social History  Substance Use Topics  . Smoking status: Current Every Day Smoker    Packs/day: 0.25    Years: 55.00    Types: Cigarettes  . Smokeless tobacco: Never Used  . Alcohol use No     Comment: 2-3 pints of wine daily   Current Outpatient Prescriptions  Medication Sig Dispense Refill  . acetaminophen (TYLENOL) 325 MG tablet Take 650 mg by mouth every 6 (six) hours as needed for headache.    . bisacodyl (DULCOLAX) 10 MG suppository Place 10 mg rectally daily as needed for mild constipation or moderate constipation (if not relieved by MOM). Reported on 07/15/2015    . clopidogrel (PLAVIX) 75 MG tablet Take 1 tablet (75 mg total) by mouth daily with breakfast.    . magnesium hydroxide (MILK OF MAGNESIA) 400 MG/5ML suspension Take 30 mLs by  mouth daily as needed for mild constipation.    . metoprolol tartrate (LOPRESSOR) 25 MG tablet Take 0.5 tablets (12.5 mg total) by mouth 2 (two) times daily.    . Multiple Vitamin (MULTIVITAMIN WITH MINERALS) TABS tablet Take 1 tablet by mouth daily.    . NON FORMULARY Med Pass 12ml by mouth two times daily    . oxyCODONE (OXY IR/ROXICODONE) 5 MG immediate release tablet Take 1 tablet (5 mg total) by mouth every 6 (six) hours as needed for moderate pain. 30 tablet 0   No current facility-administered  medications for this visit.    No Known Allergies   Review of Systems: All systems reviewed and negative except where noted in HPI.    Physical Exam: BP 102/66   Pulse 68   Ht 5' 8.75" (1.746 m) Comment: measured without shoes  Wt 143 lb (64.9 kg)   BMI 21.27 kg/m  Constitutional: Pleasant, thin black male in no acute distress. Psychiatric: quiet, not really conversant but answers questions with brief response. Cooperative. [place] HEENT: Normocephalic and atraumatic. Conjunctivae are normal. No scleral icterus. Neck supple.  Cardiovascular: Normal rate, regular rhythm.  Pulmonary/chest: Effort normal and breath sounds normal. No wheezing, rales or rhonchi. Abdominal: Soft, nondistended, nontender. Bowel sounds active throughout. There are no masses palpable. No hepatomegaly. Extremities: no edema Lymphadenopathy: No cervical adenopathy noted. Neurological: Alert and oriented to person place and time. Skin: Skin is warm and dry. No rashes noted.   ASSESSMENT AND PLAN:  6. 75 year old male with hx of large adenomatous colon polyps, overdue for surveillance colonoscopy (due March 2016). No GI complaints. Someone at [facility] where patient lives realized he was overdue for colonoscopy. Sister helps patient with medical decisions as he has dementia. The procedure was discussed with patient and his sister. The risks and benefits of colonoscopy with possible polypectomy were discussed and they agrees to proceed.   2. Chronic antiplatelet therapy, plavix.  Hold plavix 5 days before procedure - will instruct when and how to resume after procedure. Patient and sister understand there is a risk of thrombosis or ischemia/infarct of extremities or other organs off plavix.  Will communicate by phone or EMR with patient's prescribing provider to confirm that holding plavix is reasonable in this case.   3. PAD, s/p left superficial femoral artery stenting August 2016. Following that he underwent  a partial amputation of left foot. He is maintained on   Plavix, followed by Dr. Oneida Alar.    4. Hx of grade I diastolic dysfunction , EF 55-60% on echo Aug 2016  5. Dementia, sister helps with medical decision making  6. Tobacco use. Reminded patient that smoking is detrimental to health, especially given his history of PAD .

## 2016-02-08 NOTE — Patient Instructions (Signed)

## 2016-02-08 NOTE — Telephone Encounter (Signed)
   COLLEN JENERETTE [birth year] [id number]  Dear Dr. Oneida Alar:  We have scheduled the above named patient for a(n) Colonoscopy procedure. Our records show that (s)he is on anticoagulation therapy.  Please advise as to whether the patient may come of their therapy of Plavix 5 days prior to their procedure which is scheduled for 04/21/16.  Please route your response to Marlon Pel, CMA or fax response to [phone].  Sincerely,    [facility]

## 2016-02-09 ENCOUNTER — Encounter: Payer: Self-pay | Admitting: Nurse Practitioner

## 2016-02-09 NOTE — Progress Notes (Signed)
I agree with the above note, plan 

## 2016-02-10 NOTE — Telephone Encounter (Signed)
Per Dr. Oneida Alar patient can hold Plavix 5 days prior to his procedure. Called [facility] and informed Mr. Fausnaugh nurse that patient can hold Plavix 5 days prior to procedure. Order also faxed to [phone] per nurse.

## 2016-02-18 ENCOUNTER — Non-Acute Institutional Stay (SKILLED_NURSING_FACILITY): Payer: Medicare Other | Admitting: Nurse Practitioner

## 2016-02-18 ENCOUNTER — Encounter: Payer: Self-pay | Admitting: Nurse Practitioner

## 2016-02-18 DIAGNOSIS — D638 Anemia in other chronic diseases classified elsewhere: Secondary | ICD-10-CM | POA: Diagnosis not present

## 2016-02-18 DIAGNOSIS — I10 Essential (primary) hypertension: Secondary | ICD-10-CM

## 2016-02-18 DIAGNOSIS — I739 Peripheral vascular disease, unspecified: Secondary | ICD-10-CM

## 2016-02-18 DIAGNOSIS — I429 Cardiomyopathy, unspecified: Secondary | ICD-10-CM

## 2016-02-18 DIAGNOSIS — Z720 Tobacco use: Secondary | ICD-10-CM | POA: Diagnosis not present

## 2016-02-18 NOTE — Progress Notes (Signed)
Patient ID: Leon Foster, male   DOB: Aug 28, 1940, 75 y.o.   MRN: [id number]    Nursing Home Location:  [facility]  Place of Service: SNF (31)  PCP: [name], MD  No Known Allergies  Chief Complaint  Patient presents with  . Medical Management of Chronic Issues    Routine Visit    HPI:  Patient is a 75 y.o. male seen today at [facility] for medical management of his chronic issues. 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 Pt has been doing well in the last month. There has been no acute issues. Pt without concerns. Says he cut back on smoking now down to 7 cigarettes a day. Has no motivation to stop smoking at this time.  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 Denies trouble sleeping. No behaviors noted Had follow up with GI last month with plans for screening colonoscopy in January  Review of Systems:  Review of Systems  Constitutional: Negative for chills, fatigue and fever.  HENT: Negative for congestion, postnasal drip, rhinorrhea and sore throat.   Respiratory: Negative for cough, shortness of breath and wheezing.   Cardiovascular: Negative for chest pain, palpitations and leg swelling.  Gastrointestinal: Negative for abdominal pain, constipation and diarrhea.  Genitourinary: Negative for dysuria, frequency and urgency.  Musculoskeletal: Negative for arthralgias and myalgias.  Skin: Negative.   Neurological: Negative for dizziness, light-headedness and headaches.  Psychiatric/Behavioral: Positive for confusion. Negative for agitation and self-injury. The patient is not nervous/anxious.     Past Medical History:  Diagnosis Date  . Acute renal failure ([place]) 11/2014   from recent hospitalization notes  . Alcoholic ([place])   . AVM (arteriovenous malformation) of colon    colonoscopy 2011 [facility]   . Cardiomyopathy ([place])    from recent hospitalization notes  . Confusion   . Dementia   . Gangrene of digit    left toe  . Leukocytosis   . PAD (peripheral artery disease) ([place])   . Pulmonary hypertension   . Seizures ([place])     1 time seizure on 12/28/14 - seen in ED  . Tobacco dependence    Past Surgical History:  Procedure Laterality Date  . AMPUTATION Left 01/01/2015   Procedure: Left Transmetatarsal Amputation;  Surgeon: Newt Minion, MD;  Location: [place];  Service: Orthopedics;  Laterality: Left;  . LOWER EXTREMITY ANGIOGRAM Bilateral 12/14/2014   Procedure: Lower Extremity Angiogram;  Surgeon: Elam Dutch, MD;  Location: [place] [facility];  Service: Cardiovascular;  Laterality: Bilateral;  . PERIPHERAL VASCULAR CATHETERIZATION N/A 12/14/2014   Procedure: Abdominal Aortogram;  Surgeon: Elam Dutch, MD;  Location: [place] [facility];  Service: Cardiovascular;  Laterality: N/A;  . PERIPHERAL VASCULAR CATHETERIZATION Left 12/14/2014   Procedure: Peripheral Vascular Intervention;  Surgeon: Elam Dutch, MD;  Location: [place] [facility];  Service: Cardiovascular;  Laterality: Left;  SFA   Social History:   reports that he has been smoking Cigarettes.  He has a 13.75 pack-year smoking history. He has never used smokeless tobacco. He reports that he does not drink alcohol or use drugs.  Family History  Problem Relation Age of Onset  . Stroke Sister   . Deep vein thrombosis Sister   . Cancer Brother   . Diabetes Brother   . Hyperlipidemia Brother   . Hypertension Brother   .  Heart disease Father   . Hypertension Sister     Medications: Patient's Medications  New Prescriptions   No medications on file  Previous Medications   ACETAMINOPHEN (TYLENOL) 325 MG TABLET    Take 650 mg by mouth every 6 (six) hours as needed for headache.   BISACODYL (DULCOLAX) 10 MG SUPPOSITORY    Place 10 mg rectally daily as needed for mild constipation or moderate constipation (if not relieved by MOM). Reported  on 07/15/2015   CLOPIDOGREL (PLAVIX) 75 MG TABLET    Take 1 tablet (75 mg total) by mouth daily with breakfast.   MAGNESIUM HYDROXIDE (MILK OF MAGNESIA) 400 MG/5ML SUSPENSION    Take 30 mLs by mouth daily as needed for mild constipation.   METOPROLOL TARTRATE (LOPRESSOR) 25 MG TABLET    Take 0.5 tablets (12.5 mg total) by mouth 2 (two) times daily.   MULTIPLE VITAMIN (MULTIVITAMIN WITH MINERALS) TABS TABLET    Take 1 tablet by mouth daily.   NON FORMULARY    Med Pass 116ml by mouth two times daily  Modified Medications   No medications on file  Discontinued Medications   OXYCODONE (OXY IR/ROXICODONE) 5 MG IMMEDIATE RELEASE TABLET    Take 1 tablet (5 mg total) by mouth every 6 (six) hours as needed for moderate pain.     Physical Exam: Vitals:   02/18/16 1137  BP: 113/72  Pulse: 69  Resp: 20  Temp: 99 F (37.2 C)  SpO2: 99%  Weight: 144 lb (65.3 kg)  Height: 5\' 7"  (1.702 m)    Physical Exam  Constitutional: No distress.  Frail elderly male   HENT:  Head: Normocephalic and atraumatic.  Poor detention   Eyes: Pupils are equal, round, and reactive to light.  Neck: Normal range of motion. Neck supple.  Cardiovascular: Normal rate, regular rhythm and normal heart sounds.   Pulmonary/Chest: Effort normal and breath sounds normal.  Abdominal: Soft. Bowel sounds are normal.  Musculoskeletal: He exhibits no edema or tenderness.  Neurological: He is alert.  Skin: Skin is warm and dry. He is not diaphoretic.  Psychiatric: His affect is blunt.    Labs reviewed: Basic Metabolic Panel:  Recent Labs  06/11/15 10/12/15  NA 137 138  K 4.0 4.3  BUN 8 11  CREATININE 0.8 0.8   Liver Function Tests:  Recent Labs  10/12/15  AST 9*  ALT 6*  ALKPHOS 75   No results for input(s): LIPASE, AMYLASE in the last 8760 hours. No results for input(s): AMMONIA in the last 8760 hours. CBC:  Recent Labs  06/11/15 12/19/15  WBC 6.8 7.6  HGB 11.8* 12.2*  HCT 37* 37*  PLT 361 329    TSH: No results for input(s): TSH in the last 8760 hours. A1C: Lab Results  Component Value Date   HGBA1C 5.0 11/27/2014   Lipid Panel:  Recent Labs  06/11/15  CHOL 114  HDL 33*  LDLCALC 62  TRIG 96    Radiological Exams: No results found.   Assessment/Plan 1. Essential hypertension Blood pressure stable. conts on metoprolol 12.5 mg BID   2. Tobacco abuse -encouraged smoking cessation.   3. Cardiomyopathy, unspecified type (HCC) Stable, without chest pains or fluid overload. conts on metoprolol 12. 5 mg BID   4. PAD (peripheral artery disease) (HCC) Without complaints of pain in legs, conts on plavix 75 mg daily   5. Anemia, chronic disease hgb stable in September, will cont to monitor  Dennys Guin K. [place], [place]  [facility] [phone] 8 am - 5 pm) [phone] (after hours)

## 2016-02-29 NOTE — Addendum Note (Signed)
Addended by: Mena Goes on: 02/29/2016 04:19 PM   Modules accepted: Orders

## 2016-03-17 ENCOUNTER — Encounter: Payer: Self-pay | Admitting: Nurse Practitioner

## 2016-03-17 ENCOUNTER — Non-Acute Institutional Stay (SKILLED_NURSING_FACILITY): Payer: Medicare Other | Admitting: Nurse Practitioner

## 2016-03-17 DIAGNOSIS — E440 Moderate protein-calorie malnutrition: Secondary | ICD-10-CM | POA: Diagnosis not present

## 2016-03-17 DIAGNOSIS — I739 Peripheral vascular disease, unspecified: Secondary | ICD-10-CM | POA: Diagnosis not present

## 2016-03-17 DIAGNOSIS — F0390 Unspecified dementia without behavioral disturbance: Secondary | ICD-10-CM

## 2016-03-17 DIAGNOSIS — I10 Essential (primary) hypertension: Secondary | ICD-10-CM | POA: Diagnosis not present

## 2016-03-17 NOTE — Progress Notes (Signed)
Patient ID: Leon Foster, male   DOB: [birth year], 75 y.o.   MRN: [id number]    Nursing Home Location:  [facility]  Place of Service: SNF (31)  PCP: Leon Cobble, MD  No Known Allergies  Chief Complaint  Patient presents with  . Medical Management of Chronic Issues    Routine Visit    HPI:  Patient is a 75 y.o. male seen today at [facility] for medical management of his chronic issues. 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 Staff does not report any issues. Pt has no complaints today. Coming in from outside were he was smoking. Does not wish to quit smoking despite PAD. Pt reports good appetite.  Review of Systems:  Review of Systems  Constitutional: Negative for chills, fatigue and fever.  HENT: Negative for congestion, postnasal drip, rhinorrhea and sore throat.   Respiratory: Negative for cough, shortness of breath and wheezing.   Cardiovascular: Negative for chest pain, palpitations and leg swelling.  Gastrointestinal: Negative for abdominal pain, constipation and diarrhea.  Genitourinary: Negative for dysuria, frequency and urgency.  Musculoskeletal: Negative for arthralgias and myalgias.  Skin: Negative.   Neurological: Negative for dizziness, light-headedness and headaches.  Psychiatric/Behavioral: Positive for confusion. Negative for agitation and self-injury. The patient is not nervous/anxious.     Past Medical History:  Diagnosis Date  . Acute renal failure ([place]) 11/2014   from recent hospitalization notes  . Alcoholic ([place])   . AVM (arteriovenous malformation) of colon    colonoscopy 2011 [facility]   . Cardiomyopathy ([place])    from recent hospitalization notes  . Confusion   . Dementia   . Gangrene of digit    left toe  . Leukocytosis   . PAD (peripheral artery disease) ([place])   . Pulmonary hypertension   . Seizures ([place])     1 time seizure on 12/28/14 -  seen in ED  . Tobacco dependence    Past Surgical History:  Procedure Laterality Date  . AMPUTATION Left 01/01/2015   Procedure: Left Transmetatarsal Amputation;  Surgeon: Newt Minion, MD;  Location: [place];  Service: Orthopedics;  Laterality: Left;  . LOWER EXTREMITY ANGIOGRAM Bilateral 12/14/2014   Procedure: Lower Extremity Angiogram;  Surgeon: Elam Dutch, MD;  Location: [place] [facility];  Service: Cardiovascular;  Laterality: Bilateral;  . PERIPHERAL VASCULAR CATHETERIZATION N/A 12/14/2014   Procedure: Abdominal Aortogram;  Surgeon: Elam Dutch, MD;  Location: [place] [facility];  Service: Cardiovascular;  Laterality: N/A;  . PERIPHERAL VASCULAR CATHETERIZATION Left 12/14/2014   Procedure: Peripheral Vascular Intervention;  Surgeon: Elam Dutch, MD;  Location: [place] [facility];  Service: Cardiovascular;  Laterality: Left;  SFA   Social History:   reports that he has been smoking Cigarettes.  He has a 13.75 pack-year smoking history. He has never used smokeless tobacco. He reports that he does not drink alcohol or use drugs.  Family History  Problem Relation Age of Onset  . Stroke Sister   . Deep vein thrombosis Sister   . Cancer Brother   . Diabetes Brother   . Hyperlipidemia Brother   . Hypertension Brother   . Heart disease Father   . Hypertension Sister     Medications: Patient's Medications  New Prescriptions   No medications on file  Previous Medications   ACETAMINOPHEN (TYLENOL) 325 MG TABLET    Take 650  mg by mouth every 6 (six) hours as needed for headache.   BISACODYL (DULCOLAX) 10 MG SUPPOSITORY    Place 10 mg rectally daily as needed for mild constipation or moderate constipation (if not relieved by MOM). Reported on 07/15/2015   CLOPIDOGREL (PLAVIX) 75 MG TABLET    Take 1 tablet (75 mg total) by mouth daily with breakfast.   MAGNESIUM HYDROXIDE (MILK OF MAGNESIA) 400 MG/5ML SUSPENSION    Take 30 mLs by mouth daily as needed for mild constipation.     METOPROLOL TARTRATE (LOPRESSOR) 25 MG TABLET    Take 0.5 tablets (12.5 mg total) by mouth 2 (two) times daily.   MULTIPLE VITAMIN (MULTIVITAMIN WITH MINERALS) TABS TABLET    Take 1 tablet by mouth daily.   NON FORMULARY    Med Pass 163ml by mouth two times daily  Modified Medications   No medications on file  Discontinued Medications   No medications on file     Physical Exam: Vitals:   03/17/16 1439  BP: 127/61  Pulse: 71  Resp: 20  Temp: 98.9 F (37.2 C)  Weight: 149 lb (67.6 kg)  Height: 5\' 7"  (1.702 m)    Physical Exam  Constitutional: No distress.  Frail elderly male   HENT:  Head: Normocephalic and atraumatic.  Poor detention   Eyes: Pupils are equal, round, and reactive to light.  Neck: Normal range of motion. Neck supple.  Cardiovascular: Normal rate, regular rhythm and normal heart sounds.   Pulmonary/Chest: Effort normal and breath sounds normal.  Abdominal: Soft. Bowel sounds are normal.  Musculoskeletal: He exhibits no edema or tenderness.  Neurological: He is alert.  Memory loss noted, unsure of time but aware of place.   Skin: Skin is warm and dry. He is not diaphoretic.  Psychiatric: His affect is blunt.    Labs reviewed: Basic Metabolic Panel:  Recent Labs  06/11/15 10/12/15  NA 137 138  K 4.0 4.3  BUN 8 11  CREATININE 0.8 0.8   Liver Function Tests:  Recent Labs  10/12/15  AST 9*  ALT 6*  ALKPHOS 75   No results for input(s): LIPASE, AMYLASE in the last 8760 hours. No results for input(s): AMMONIA in the last 8760 hours. CBC:  Recent Labs  06/11/15 12/19/15  WBC 6.8 7.6  HGB 11.8* 12.2*  HCT 37* 37*  PLT 361 329   TSH: No results for input(s): TSH in the last 8760 hours. A1C: Lab Results  Component Value Date   HGBA1C 5.0 11/27/2014   Lipid Panel:  Recent Labs  06/11/15  CHOL 114  HDL 33*  LDLCALC 62  TRIG 96    Radiological Exams: No results found.   Assessment/Plan 1. Essential hypertension Blood  pressure stable, conts on metoprolol tartrate twice daily   2. Dementia without behavioral disturbance, unspecified dementia type Stable, without significant decline in the last month.   3. Moderate protein-calorie malnutrition (HCC) Weight has been stable, weight gain in the last month noted. conts on supplements   4. PAD (peripheral artery disease) (HCC) Stable, conts on plavix, without pain  Lousie Calico K. Harle Battiest  [facility] [phone] 8 am - 5 pm) [phone] (after hours)

## 2016-03-29 ENCOUNTER — Encounter: Payer: Self-pay | Admitting: Podiatry

## 2016-03-29 ENCOUNTER — Ambulatory Visit (INDEPENDENT_AMBULATORY_CARE_PROVIDER_SITE_OTHER): Payer: Medicare Other | Admitting: Podiatry

## 2016-03-29 VITALS — BP 124/74 | HR 59 | Resp 18

## 2016-03-29 DIAGNOSIS — M79674 Pain in right toe(s): Secondary | ICD-10-CM

## 2016-03-29 DIAGNOSIS — B351 Tinea unguium: Secondary | ICD-10-CM

## 2016-03-29 NOTE — Progress Notes (Signed)
Patient ID: Leon Foster, male   DOB: 09/11/1940, 75 y.o.   MRN: [id number]    Subjective: This patient presents for scheduled visit complaining of uncomfortable toenails and right foot when walking wearing shoes and requests toenail debridement.   Objective: Vascular: DP pulses 0/4 bilaterally PT pulses 0/4 bilaterally  Neurological: Sensation to 10 g monofilament wire intact 5/5 bilaterally Vibratory sensation nonreactive bilaterally Ankle reflex equal and reactive bilaterally  Dermatological: Open skin lesions bilaterally Toenails 1-5 are elongated, hypertrophic, deformed and tender to direct palpation  Musculoskeletal: Transmetatarsal amputation left foot well-healed incision site HAV right Hammertoe second and third right Ankle motion 90, bilaterally  Assessment: Absent pedal pulses consistent with known history of peripheral arterial disease Transmetatarsal amputation left Symptomatic onychomycosis 1-5 right HAV right Hammertoe second right  Plan: Debridement toenails 1-5 right mechanically an electrical without any bleeding  Reappoint 3 months 

## 2016-03-29 NOTE — Patient Instructions (Signed)
Visit today with was scheduled for debridement of mycotic toenails on the right foot. There were no open skin lesions noted. Return every 3 months for nail debridement or sooner if you have concern. Gean Birchwood DPM [facility] 03/29/2016   Diabetes and Foot Care Diabetes may cause you to have problems because of poor blood supply (circulation) to your feet and legs. This may cause the skin on your feet to become thinner, break easier, and heal more slowly. Your skin may become dry, and the skin may peel and crack. You may also have nerve damage in your legs and feet causing decreased feeling in them. You may not notice minor injuries to your feet that could lead to infections or more serious problems. Taking care of your feet is one of the most important things you can do for yourself. Follow these instructions at home:  Wear shoes at all times, even in the house. Do not go barefoot. Bare feet are easily injured.  Check your feet daily for blisters, cuts, and redness. If you cannot see the bottom of your feet, use a mirror or ask someone for help.  Wash your feet with warm water (do not use hot water) and mild soap. Then pat your feet and the areas between your toes until they are completely dry. Do not soak your feet as this can dry your skin.  Apply a moisturizing lotion or petroleum jelly (that does not contain alcohol and is unscented) to the skin on your feet and to dry, brittle toenails. Do not apply lotion between your toes.  Trim your toenails straight across. Do not dig under them or around the cuticle. File the edges of your nails with an emery board or nail file.  Do not cut corns or calluses or try to remove them with medicine.  Wear clean socks or stockings every day. Make sure they are not too tight. Do not wear knee-high stockings since they may decrease blood flow to your legs.  Wear shoes that fit properly and have enough cushioning. To break in new shoes, wear  them for just a few hours a day. This prevents you from injuring your feet. Always look in your shoes before you put them on to be sure there are no objects inside.  Do not cross your legs. This may decrease the blood flow to your feet.  If you find a minor scrape, cut, or break in the skin on your feet, keep it and the skin around it clean and dry. These areas may be cleansed with mild soap and water. Do not cleanse the area with peroxide, alcohol, or iodine.  When you remove an adhesive bandage, be sure not to damage the skin around it.  If you have a wound, look at it several times a day to make sure it is healing.  Do not use heating pads or hot water bottles. They may burn your skin. If you have lost feeling in your feet or legs, you may not know it is happening until it is too late.  Make sure your health care provider performs a complete foot exam at least annually or more often if you have foot problems. Report any cuts, sores, or bruises to your health care provider immediately. Contact a health care provider if:  You have an injury that is not healing.  You have cuts or breaks in the skin.  You have an ingrown nail.  You notice redness on your legs or feet.  You feel burning or tingling in your legs or feet.  You have pain or cramps in your legs and feet.  Your legs or feet are numb.  Your feet always feel cold. Get help right away if:  There is increasing redness, swelling, or pain in or around a wound.  There is a red line that goes up your leg.  Pus is coming from a wound.  You develop a fever or as directed by your health care provider.  You notice a bad smell coming from an ulcer or wound. This information is not intended to replace advice given to you by your health care provider. Make sure you discuss any questions you have with your health care provider. Document Released: 03/31/2000 Document Revised: 09/09/2015 Document Reviewed: 09/10/2012 Elsevier  Interactive Patient Education  2017 [employer].

## 2016-04-20 IMAGING — US US RENAL
1 series · 14 of 25 positions shown · non-contrast
Comparison: None.

CLINICAL DATA: Acute renal failure

EXAM:
RENAL / URINARY TRACT ULTRASOUND COMPLETE

[Series 1: us renal · 0.21mm/px · 14 of 34 slices shown]
[im 1/34]
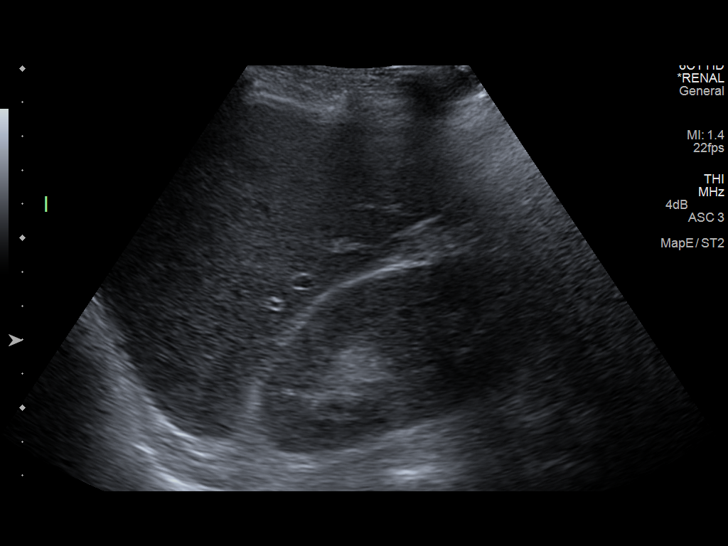
[im 3/34]
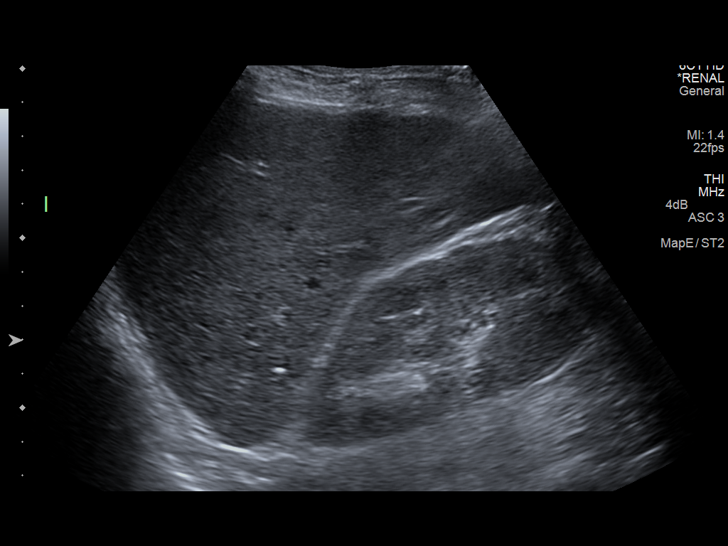
[im 6/34]
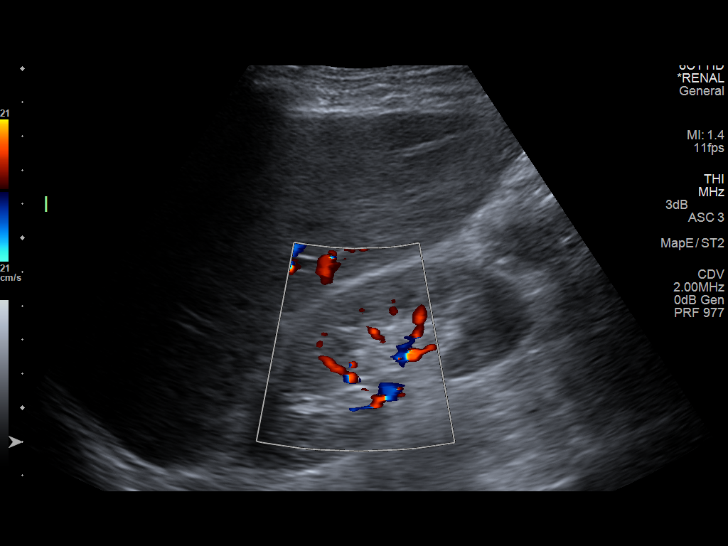
[im 9/34]
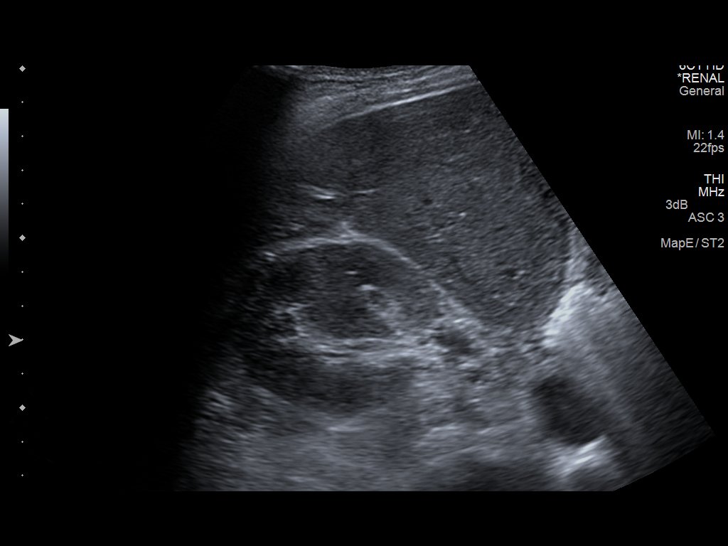
[im 12/34]
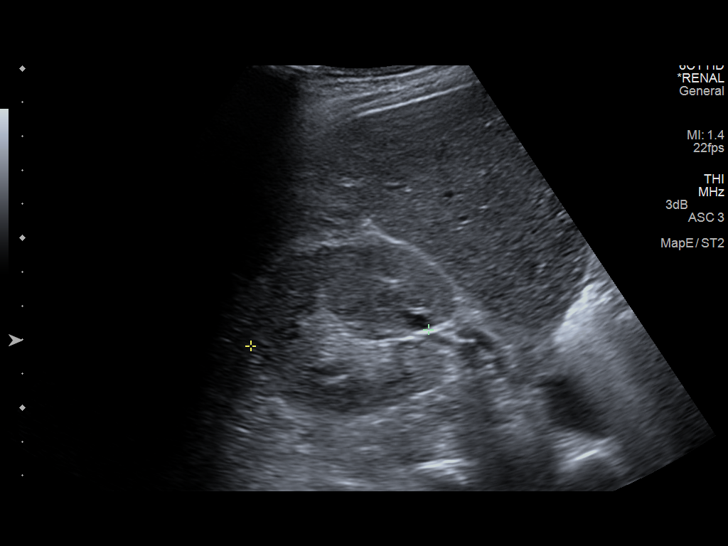
[im 13/34]
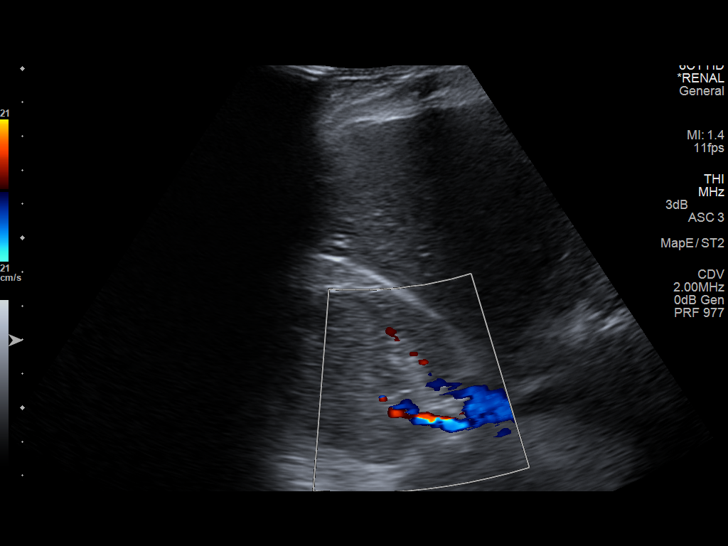
[im 16/34]
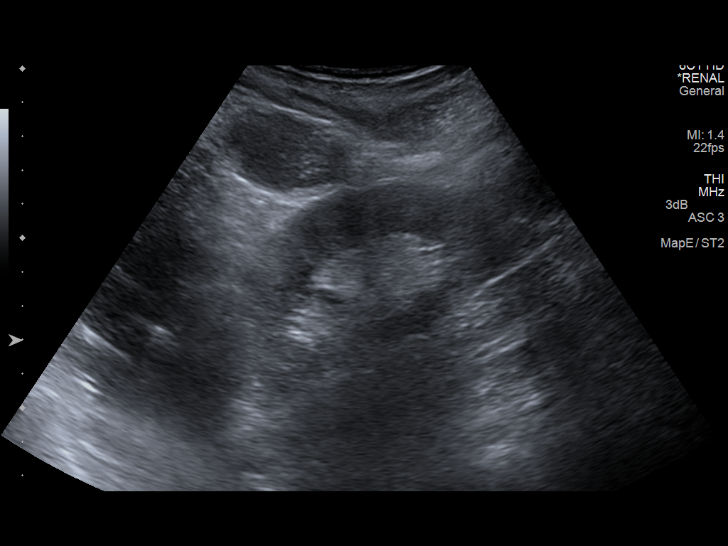
[im 18/34]
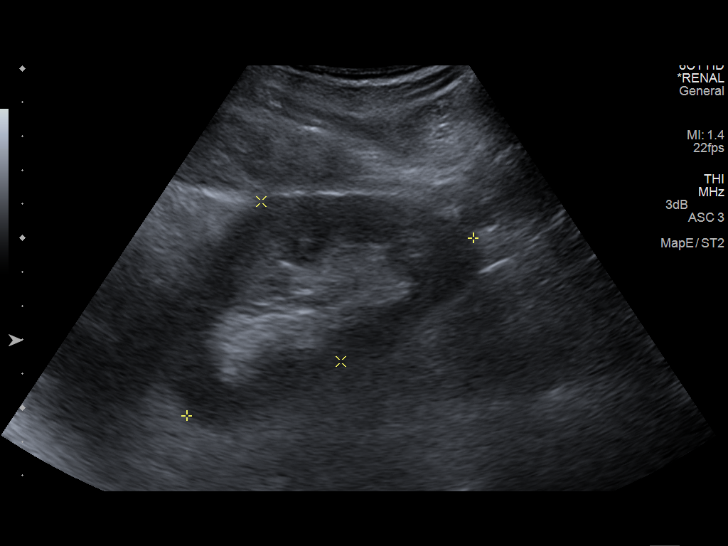
[im 21/34]
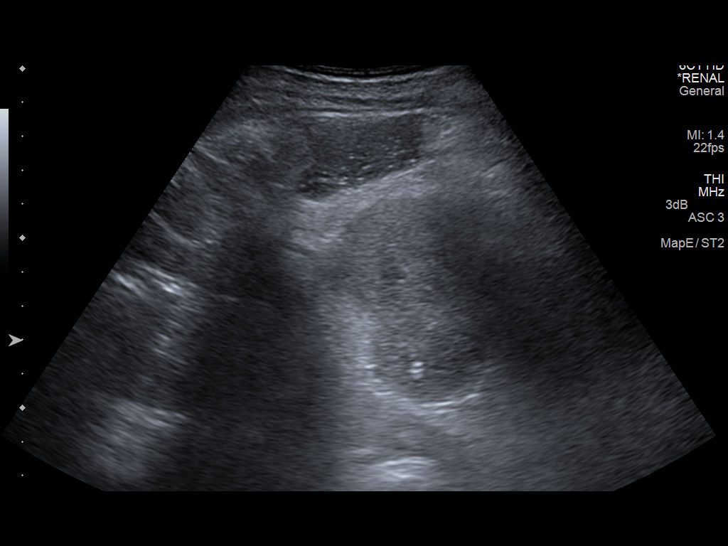
[im 23/34]
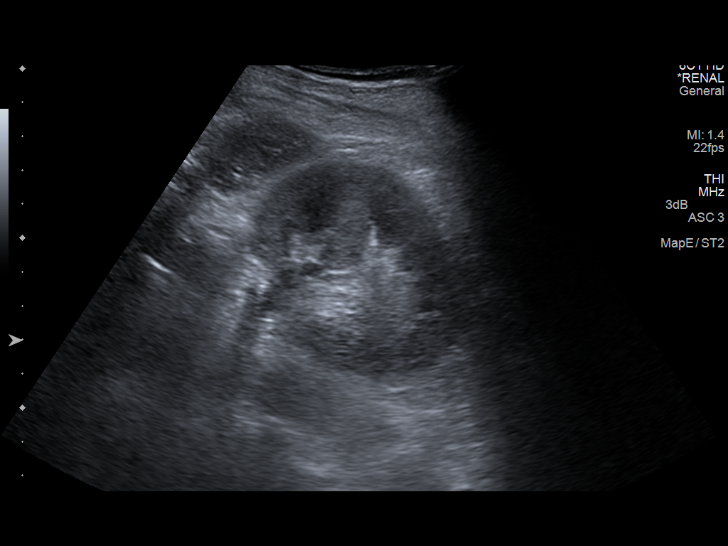
[im 25/34]
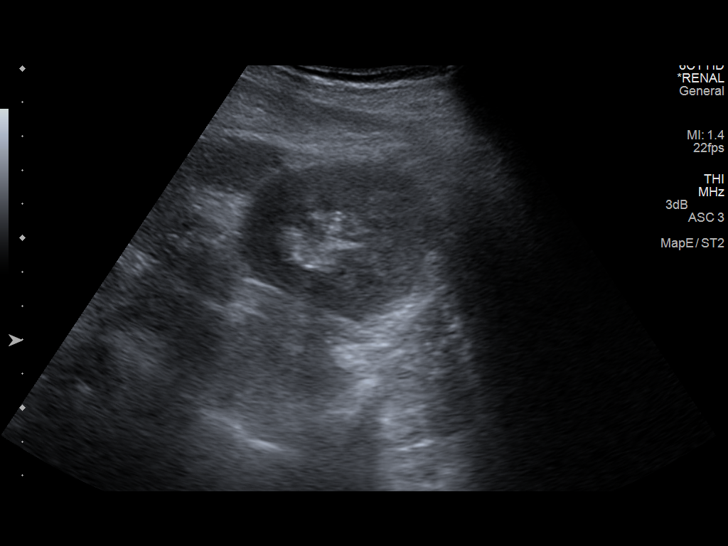
[im 28/34]
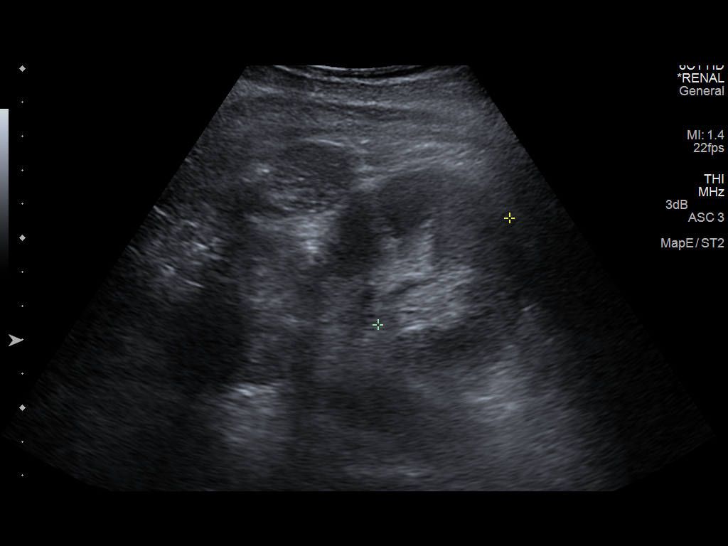
[im 31/34]
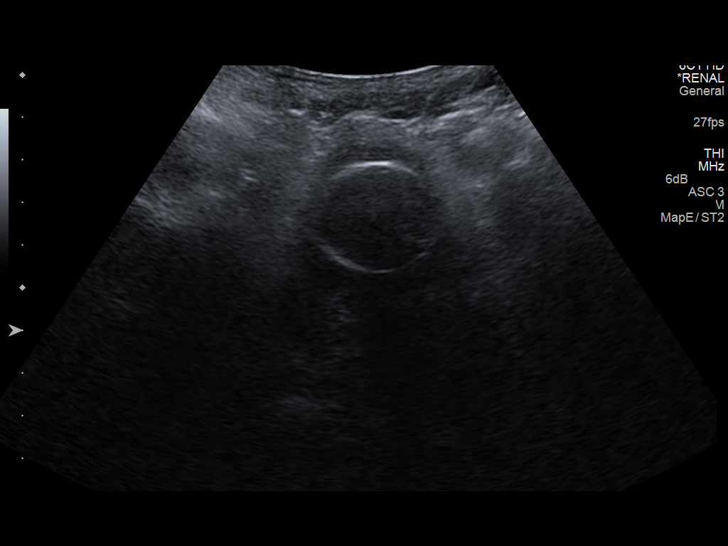
[im 34/34]
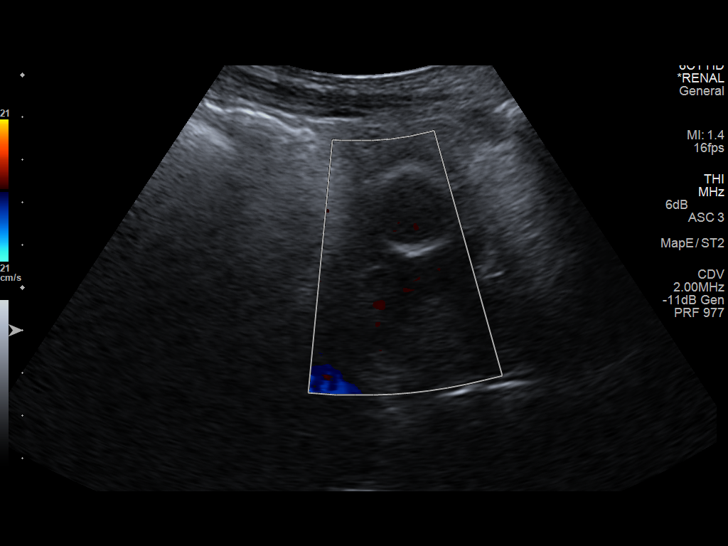

[14 of 25 positions shown; findings below may reference images not displayed]

FINDINGS: Right Kidney:

Length: 10.1 cm.. Echogenicity within normal limits. No mass or
hydronephrosis visualized.

Left Kidney:

Length: 9.9 cm.. Echogenicity within normal limits. No mass or
hydronephrosis visualized. A few small echogenic foci are identified
which may represent small stones. No obstructive changes are noted.

Bladder:

Decompressed by a Foley catheter
IMPRESSION: Small echogenic foci within the left kidney which may represent
small nonobstructing stones.

No other focal abnormality is noted.

## 2016-04-21 ENCOUNTER — Encounter: Payer: Self-pay | Admitting: Gastroenterology

## 2016-04-21 ENCOUNTER — Ambulatory Visit (AMBULATORY_SURGERY_CENTER): Payer: Medicare Other | Admitting: Gastroenterology

## 2016-04-21 VITALS — BP 98/59 | HR 71 | Temp 98.0°F | Resp 21 | Ht 68.75 in | Wt 143.0 lb

## 2016-04-21 DIAGNOSIS — Z86010 Personal history of colonic polyps: Secondary | ICD-10-CM

## 2016-04-21 DIAGNOSIS — K5730 Diverticulosis of large intestine without perforation or abscess without bleeding: Secondary | ICD-10-CM | POA: Diagnosis not present

## 2016-04-21 DIAGNOSIS — D122 Benign neoplasm of ascending colon: Secondary | ICD-10-CM

## 2016-04-21 MED ORDER — SODIUM CHLORIDE 0.9 % IV SOLN: 500.0000 mL | INTRAVENOUS | Status: DC

## 2016-04-21 NOTE — Progress Notes (Signed)
Pt is very poor historian.  Phoned his nurse at the nursing home to verify last date of Plavix dose.  She stated that he had not taken plavix in 5 days as instructed.  She also stated that he took all of his other meds yesterday morning.  Patient is unable to verify this information.  He states that he drank all of the prep and his stools are "like water".

## 2016-04-21 NOTE — Op Note (Signed)
[facility] Patient Name: Leon Foster Procedure Date: 04/21/2016 10:00 AM MRN: [id number] Endoscopist: Milus Banister , MD Age: 76 Referring MD:  Date of Birth: 01/21/1941 Gender: Male Account #: [id number] Procedure:                Colonoscopy Indications:              High risk colon cancer surveillance: Personal                            history of colonic polyps; colonoscopy Dr. Deatra Ina                            2010 found adenomatous polyps, two of them were                            2cm. Repeat colonoscopy 2011 no polyps. Sent by NH                            to consider repeat colonoscopy, sister with him in                            office 2 months consented Medicines:                Monitored Anesthesia Care Procedure:                Pre-Anesthesia Assessment:                           - Prior to the procedure, a History and Physical                            was performed, and patient medications and                            allergies were reviewed. The patient's tolerance of                            previous anesthesia was also reviewed. The risks                            and benefits of the procedure and the sedation                            options and risks were discussed with the patient.                            All questions were answered, and informed consent                            was obtained. Prior Anticoagulants: The patient has                            taken Plavix (clopidogrel), last dose was 5 days  prior to procedure. ASA Grade Assessment: III - A                            patient with severe systemic disease. After                            reviewing the risks and benefits, the patient was                            deemed in satisfactory condition to undergo the                            procedure.                           After obtaining informed consent, the colonoscope      was passed under direct vision. Throughout the                            procedure, the patient's blood pressure, pulse, and                            oxygen saturations were monitored continuously. The                            Model CF-HQ190L [id number]) scope was introduced                            through the anus and advanced to the the cecum,                            identified by appendiceal orifice and ileocecal                            valve. The colonoscopy was performed without                            difficulty. The patient tolerated the procedure                            well. The quality of the bowel preparation was                            good. The ileocecal valve, appendiceal orifice, and                            rectum were photographed. Scope In: 10:16:30 AM Scope Out: 10:27:20 AM Scope Withdrawal Time: 0 hours 7 minutes 10 seconds  Total Procedure Duration: 0 hours 10 minutes 50 seconds  Findings:                 Three sessile polyps were found in the ascending                            colon. The polyps  were 3 to 6 mm in size. These                            polyps were removed with a cold snare. Resection                            and retrieval were complete.                           Multiple small and large-mouthed diverticula were                            found in the entire colon.                           The exam was otherwise without abnormality on                            direct and retroflexion views. Complications:            No immediate complications. Estimated blood loss:                            None. Estimated Blood Loss:     Estimated blood loss: none. Impression:               - Three 3 to 6 mm polyps in the ascending colon,                            removed with a cold snare. Resected and retrieved.                           - Diverticulosis in the entire examined colon.                           - The examination  was otherwise normal on direct                            and retroflexion views. Recommendation:           - Patient has a contact number available for                            emergencies. The signs and symptoms of potential                            delayed complications were discussed with the                            patient. Return to normal activities tomorrow.                            Written discharge instructions were provided to the                            patient.                           -  Resume previous diet.                           - Continue present medications.                           - Await pathology results. Given age and multiple                            co-morbid conditions you will likely not need                            further colon cancer screening, polyp surveillance                            testing (including stool testing). Milus Banister, MD 04/21/2016 10:31:17 AM This report has been signed electronically.

## 2016-04-21 NOTE — Progress Notes (Signed)
Called to room to assist during endoscopic procedure.  Patient ID and intended procedure confirmed with present staff. Received instructions for my participation in the procedure from the performing physician.  

## 2016-04-21 NOTE — Patient Instructions (Signed)
YOU HAD AN ENDOSCOPIC PROCEDURE TODAY AT [facility]:   Refer to the procedure report that was given to you for any specific questions about what was found during the examination.  If the procedure report does not answer your questions, please call your gastroenterologist to clarify.  If you requested that your care partner not be given the details of your procedure findings, then the procedure report has been included in a sealed envelope for you to review at your convenience later.  YOU SHOULD EXPECT: Some feelings of bloating in the abdomen. Passage of more gas than usual.  Walking can help get rid of the air that was put into your GI tract during the procedure and reduce the bloating. If you had a lower endoscopy (such as a colonoscopy or flexible sigmoidoscopy) you may notice spotting of blood in your stool or on the toilet paper. If you underwent a bowel prep for your procedure, you may not have a normal bowel movement for a few days.  Please Note:  You might notice some irritation and congestion in your nose or some drainage.  This is from the oxygen used during your procedure.  There is no need for concern and it should clear up in a day or so.  SYMPTOMS TO REPORT IMMEDIATELY:   Following lower endoscopy (colonoscopy or flexible sigmoidoscopy):  Excessive amounts of blood in the stool  Significant tenderness or worsening of abdominal pains  Swelling of the abdomen that is new, acute  Fever of 100F or higher   Following upper endoscopy (EGD)  Vomiting of blood or coffee ground material  New chest pain or pain under the shoulder blades  Painful or persistently difficult swallowing  New shortness of breath  Fever of 100F or higher  Black, tarry-looking stools  For urgent or emergent issues, a gastroenterologist can be reached at any hour by calling [phone].   DIET:  We do recommend a small meal at first, but then you may proceed to your regular diet.  Drink  plenty of fluids but you should avoid alcoholic beverages for 24 hours.  ACTIVITY:  You should plan to take it easy for the rest of today and you should NOT DRIVE or use heavy machinery until tomorrow (because of the sedation medicines used during the test).    FOLLOW UP: Our staff will call the number listed on your records the next business day following your procedure to check on you and address any questions or concerns that you may have regarding the information given to you following your procedure. If we do not reach you, we will leave a message.  However, if you are feeling well and you are not experiencing any problems, there is no need to return our call.  We will assume that you have returned to your regular daily activities without incident.  If any biopsies were taken you will be contacted by phone or by letter within the next 1-3 weeks.  Please call us at [phone] if you have not heard about the biopsies in 3 weeks.    SIGNATURES/CONFIDENTIALITY: You and/or your care partner have signed paperwork which will be entered into your electronic medical record.  These signatures attest to the fact that that the information above on your After Visit Summary has been reviewed and is understood.  Full responsibility of the confidentiality of this discharge information lies with you and/or your care-partner.  Polyp, diverticulosis, and high fiber diet information given.  Resume Plavix  tomorrow.  Await pathology results.

## 2016-04-21 NOTE — Progress Notes (Signed)
To recovery, report to Scott, RN, VSS 

## 2016-04-24 ENCOUNTER — Telehealth: Payer: Self-pay

## 2016-04-24 NOTE — Telephone Encounter (Signed)
  Follow up Call-  Call back number 04/21/2016  Post procedure Call Back phone  # [phone]  Permission to leave phone message Yes  Some recent data might be hidden    Patient was called for follow up after his procedure on 04/21/2016. I spoke to [place] the patients nurse at [facility] and she reports that Leon Foster has returned to his normal daily activities without any complications.

## 2016-04-24 NOTE — Telephone Encounter (Signed)
Called facility asked for charge nurse Ria Comment. Transferred to a voicemail. Will call back later today.

## 2016-04-26 ENCOUNTER — Non-Acute Institutional Stay (SKILLED_NURSING_FACILITY): Payer: Medicare Other | Admitting: Nurse Practitioner

## 2016-04-26 DIAGNOSIS — I10 Essential (primary) hypertension: Secondary | ICD-10-CM

## 2016-04-26 DIAGNOSIS — E440 Moderate protein-calorie malnutrition: Secondary | ICD-10-CM

## 2016-04-26 DIAGNOSIS — F0390 Unspecified dementia without behavioral disturbance: Secondary | ICD-10-CM | POA: Diagnosis not present

## 2016-04-26 DIAGNOSIS — Z720 Tobacco use: Secondary | ICD-10-CM

## 2016-04-26 DIAGNOSIS — I739 Peripheral vascular disease, unspecified: Secondary | ICD-10-CM

## 2016-04-26 NOTE — Progress Notes (Signed)
Patient ID: Leon Foster, male   DOB: Oct 15, 1940, 76 y.o.   MRN: [id number]    Nursing Home Location:  [facility]  Place of Service: SNF (31)  PCP: [name], MD  No Known Allergies  Chief Complaint  Patient presents with  . Medical Management of Chronic Issues    Routine Visit    HPI:  Patient is a 76 y.o. male seen today at [facility] for medical management of his chronic issues. 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 Pt went for colonoscopy on [id number] without complications. Awaiting pathology but most likely No further screening colonoscopies will be needed. Pt conts to go to podiatry due to mycotic toenails.  Pt without complaints. Reports he has cut back on smoking.   Review of Systems:  Review of Systems  Constitutional: Negative for chills, fatigue and fever.  HENT: Negative for congestion, postnasal drip, rhinorrhea and sore throat.   Respiratory: Negative for cough, shortness of breath and wheezing.   Cardiovascular: Negative for chest pain, palpitations and leg swelling.  Gastrointestinal: Negative for abdominal pain, constipation and diarrhea.  Genitourinary: Negative for dysuria, frequency and urgency.  Musculoskeletal: Negative for arthralgias and myalgias.  Skin: Negative.   Neurological: Negative for dizziness, light-headedness and headaches.  Psychiatric/Behavioral: Positive for confusion. Negative for agitation and self-injury. The patient is not nervous/anxious.     Past Medical History:  Diagnosis Date  . Acute renal failure ([place]) 11/2014   from recent hospitalization notes  . Alcoholic ([place])   . AVM (arteriovenous malformation) of colon    colonoscopy 2011 [facility]   . Cardiomyopathy ([place])    from recent hospitalization notes  . Confusion   . Dementia   . Gangrene of digit    left toe  . Leukocytosis   . PAD (peripheral artery disease)  ([place])   . Pulmonary hypertension   . Seizures ([place])     1 time seizure on 12/28/14 - seen in ED  . Tobacco dependence    Past Surgical History:  Procedure Laterality Date  . AMPUTATION Left 01/01/2015   Procedure: Left Transmetatarsal Amputation;  Surgeon: Newt Minion, MD;  Location: [facility];  Service: Orthopedics;  Laterality: Left;  . LOWER EXTREMITY ANGIOGRAM Bilateral 12/14/2014   Procedure: Lower Extremity Angiogram;  Surgeon: Elam Dutch, MD;  Location: [facility];  Service: Cardiovascular;  Laterality: Bilateral;  . PERIPHERAL VASCULAR CATHETERIZATION N/A 12/14/2014   Procedure: Abdominal Aortogram;  Surgeon: Elam Dutch, MD;  Location: [facility];  Service: Cardiovascular;  Laterality: N/A;  . PERIPHERAL VASCULAR CATHETERIZATION Left 12/14/2014   Procedure: Peripheral Vascular Intervention;  Surgeon: Elam Dutch, MD;  Location: [facility];  Service: Cardiovascular;  Laterality: Left;  SFA   Social History:   reports that he has been smoking Cigarettes.  He has a 13.75 pack-year smoking history. He has never used smokeless tobacco. He reports that he does not drink alcohol or use drugs.  Family History  Problem Relation Age of Onset  . Stroke Sister   . Deep vein thrombosis Sister   . Cancer Brother   . Diabetes Brother   . Hyperlipidemia Brother   . Hypertension Brother   . Heart disease Father   . Hypertension Sister     Medications: Patient's Medications  New Prescriptions   No medications on file  Previous Medications  ACETAMINOPHEN (TYLENOL) 325 MG TABLET    Take 650 mg by mouth every 6 (six) hours as needed for headache.   BISACODYL (DULCOLAX) 10 MG SUPPOSITORY    Place 10 mg rectally daily as needed for mild constipation or moderate constipation (if not relieved by MOM). Reported on 07/15/2015   CLOPIDOGREL (PLAVIX) 75 MG TABLET    Take 1 tablet (75 mg total) by mouth daily with breakfast.   MAGNESIUM HYDROXIDE (MILK OF MAGNESIA)  400 MG/5ML SUSPENSION    Take 30 mLs by mouth daily as needed for mild constipation.   METOPROLOL TARTRATE (LOPRESSOR) 25 MG TABLET    Take 0.5 tablets (12.5 mg total) by mouth 2 (two) times daily.   MULTIPLE VITAMIN (MULTIVITAMIN WITH MINERALS) TABS TABLET    Take 1 tablet by mouth daily.   NON FORMULARY    Med Pass 160ml by mouth two times daily  Modified Medications   No medications on file  Discontinued Medications   No medications on file     Physical Exam: Vitals:   04/26/16 1520  BP: 140/78  Pulse: 81  Resp: 19  Temp: 98.7 F (37.1 C)  SpO2: 98%  Weight: 143 lb (64.9 kg)  Height: 5\' 8"  (1.727 m)    Physical Exam  Constitutional: No distress.  Frail elderly male   HENT:  Head: Normocephalic and atraumatic.  Poor detention   Eyes: Pupils are equal, round, and reactive to light.  Neck: Normal range of motion. Neck supple.  Cardiovascular: Normal rate, regular rhythm and normal heart sounds.   Pulmonary/Chest: Effort normal and breath sounds normal.  Abdominal: Soft. Bowel sounds are normal.  Musculoskeletal: He exhibits no edema or tenderness.  Neurological: He is alert.  Memory loss noted, unsure of time but aware of place.   Skin: Skin is warm and dry. He is not diaphoretic.  Psychiatric: His affect is blunt.    Labs reviewed: Basic Metabolic Panel:  Recent Labs  06/11/15 10/12/15  NA 137 138  K 4.0 4.3  BUN 8 11  CREATININE 0.8 0.8   Liver Function Tests:  Recent Labs  10/12/15  AST 9*  ALT 6*  ALKPHOS 75   No results for input(s): LIPASE, AMYLASE in the last 8760 hours. No results for input(s): AMMONIA in the last 8760 hours. CBC:  Recent Labs  06/11/15 12/19/15  WBC 6.8 7.6  HGB 11.8* 12.2*  HCT 37* 37*  PLT 361 329   TSH: No results for input(s): TSH in the last 8760 hours. A1C: Lab Results  Component Value Date   HGBA1C 5.0 11/27/2014   Lipid Panel:  Recent Labs  06/11/15  CHOL 114  HDL 33*  LDLCALC 62  TRIG 96     Radiological Exams: No results found.   Assessment/Plan 1. Moderate protein-calorie malnutrition ([place]) Weight loss noted, will follow up cmp, tsh. conts on supplements. Pt with poor dentition. To see the dentist in house if able. Screening colonoscopy done last week. Pt denies any pain in mouth or trouble swallowing. No GI symptoms.  2. Dementia without behavioral disturbance, unspecified dementia type Will start aricept 5 mg po qhs at this time. There has been no acute changes in cognitive or behavioral status.   3. Essential hypertension Stable, will follow up labs, conts on lopressor  4. Tobacco abuse conts to smoke, reports he has cut back, encouraged cessation  5. PAD (peripheral artery disease) ([facility]) Stable at this time, conts on plavix, will follow up [place].  Harle Battiest  [facility] [phone] 8 am - 5 pm) [phone] (after hours)

## 2016-04-27 LAB — LIPID PANEL
CHOLESTEROL: 104 mg/dL (ref 0–200)
HDL: 31 mg/dL — AB (ref 35–70)
LDL Cholesterol: 57 mg/dL
Triglycerides: 79 mg/dL (ref 40–160)

## 2016-04-27 LAB — BASIC METABOLIC PANEL
BUN: 13 mg/dL (ref 4–21)
CREATININE: 1 mg/dL (ref 0.6–1.3)
GLUCOSE: 99 mg/dL
POTASSIUM: 4 mmol/L (ref 3.4–5.3)
Sodium: 137 mmol/L (ref 137–147)

## 2016-04-27 LAB — CBC AND DIFFERENTIAL
HCT: 44 % (ref 41–53)
Hemoglobin: 14.1 g/dL (ref 13.5–17.5)
PLATELETS: 208 10*3/uL (ref 150–399)
WBC: 6.3 10*3/mL

## 2016-04-27 LAB — TSH: TSH: 0.55 u[IU]/mL (ref 0.41–5.90)

## 2016-04-27 LAB — HEPATIC FUNCTION PANEL
ALT: 10 U/L (ref 10–40)
AST: 16 U/L (ref 14–40)
Alkaline Phosphatase: 76 U/L (ref 25–125)
Bilirubin, Total: 0.5 mg/dL

## 2016-04-28 ENCOUNTER — Encounter: Payer: Self-pay | Admitting: Gastroenterology

## 2016-06-01 ENCOUNTER — Non-Acute Institutional Stay (SKILLED_NURSING_FACILITY): Payer: Medicare Other | Admitting: Nurse Practitioner

## 2016-06-01 ENCOUNTER — Encounter: Payer: Self-pay | Admitting: Nurse Practitioner

## 2016-06-01 DIAGNOSIS — I10 Essential (primary) hypertension: Secondary | ICD-10-CM | POA: Diagnosis not present

## 2016-06-01 DIAGNOSIS — F0390 Unspecified dementia without behavioral disturbance: Secondary | ICD-10-CM | POA: Diagnosis not present

## 2016-06-01 DIAGNOSIS — I739 Peripheral vascular disease, unspecified: Secondary | ICD-10-CM | POA: Diagnosis not present

## 2016-06-01 DIAGNOSIS — Z720 Tobacco use: Secondary | ICD-10-CM

## 2016-06-01 DIAGNOSIS — E440 Moderate protein-calorie malnutrition: Secondary | ICD-10-CM

## 2016-06-01 NOTE — Progress Notes (Signed)
Careteam: Patient Care Team: Hendricks Limes, MD as PCP - General (Internal Medicine)  Advanced Directive information Does Patient Have a Medical Advance Directive?: No  No Known Allergies  Chief Complaint  Patient presents with  . Medical Management of Chronic Issues    Routine Visit     HPI: Patient is a 76 y.o. male seen at [facility] today for medical management of chronic conditions. He has a past medical history of ETOH, PAD with gangrenous left toe s/p amputation, HTN, ARF, and current smoker. He has no complaints today. He continues to see podiatry due to mycotic toenails. He is currently on the list to be seen by facility dentist for poor dentition. Reports he continues to cut back on his smoking. Weight is up 1lb from 04/26/16. Reports that he is eating good and taking supplements in between meals. Aricept started due to mild dementia with memory loss. He reports that this has been helping with his recall.   Review of Systems:  Review of Systems  Constitutional: Negative for activity change, appetite change, chills, fatigue, fever and unexpected weight change.  HENT: Positive for dental problem. Negative for congestion, ear pain, rhinorrhea, sinus pain, sinus pressure, sneezing and sore throat.   Respiratory: Negative for cough, chest tightness, shortness of breath and wheezing.   Cardiovascular: Negative for chest pain, palpitations and leg swelling.  Gastrointestinal: Negative for abdominal pain, constipation, diarrhea, nausea and vomiting.  Genitourinary: Negative for difficulty urinating.  Musculoskeletal: Negative for back pain and gait problem.  Neurological: Negative for weakness, numbness and headaches.  Psychiatric/Behavioral: Negative for agitation, confusion, decreased concentration and sleep disturbance.    Past Medical History:  Diagnosis Date  . Acute renal failure ([place]) 11/2014   from recent hospitalization notes  . Alcoholic ([place])   . AVM  (arteriovenous malformation) of colon    colonoscopy 2011 [facility]   . Cardiomyopathy ([place])    from recent hospitalization notes  . Confusion   . Dementia   . Gangrene of digit    left toe  . Leukocytosis   . PAD (peripheral artery disease) ([place])   . Pulmonary hypertension   . Seizures ([place])     1 time seizure on 12/28/14 - seen in ED  . Tobacco dependence    Past Surgical History:  Procedure Laterality Date  . AMPUTATION Left 01/01/2015   Procedure: Left Transmetatarsal Amputation;  Surgeon: Newt Minion, MD;  Location: [place];  Service: Orthopedics;  Laterality: Left;  . LOWER EXTREMITY ANGIOGRAM Bilateral 12/14/2014   Procedure: Lower Extremity Angiogram;  Surgeon: Elam Dutch, MD;  Location: [place] [facility];  Service: Cardiovascular;  Laterality: Bilateral;  . PERIPHERAL VASCULAR CATHETERIZATION N/A 12/14/2014   Procedure: Abdominal Aortogram;  Surgeon: Elam Dutch, MD;  Location: [place] [facility];  Service: Cardiovascular;  Laterality: N/A;  . PERIPHERAL VASCULAR CATHETERIZATION Left 12/14/2014   Procedure: Peripheral Vascular Intervention;  Surgeon: Elam Dutch, MD;  Location: [place] [facility];  Service: Cardiovascular;  Laterality: Left;  SFA   Social History:   reports that he has been smoking Cigarettes.  He has a 13.75 pack-year smoking history. He has never used smokeless tobacco. He reports that he does not drink alcohol or use drugs.  Family History  Problem Relation Age of Onset  . Stroke Sister   . Deep vein thrombosis Sister   . Cancer Brother   . Diabetes Brother   . Hyperlipidemia Brother   . Hypertension Brother   .  Heart disease Father   . Hypertension Sister     Medications: Patient's Medications  New Prescriptions   No medications on file  Previous Medications   ACETAMINOPHEN (TYLENOL) 325 MG TABLET    Take 650 mg by mouth every 6 (six) hours as needed for headache.   BISACODYL (DULCOLAX) 10 MG SUPPOSITORY    Place 10 mg rectally  daily as needed for mild constipation or moderate constipation (if not relieved by MOM). Reported on 07/15/2015   CLOPIDOGREL (PLAVIX) 75 MG TABLET    Take 1 tablet (75 mg total) by mouth daily with breakfast.   DONEPEZIL (ARICEPT) 10 MG TABLET    Take 10 mg by mouth at bedtime.   MAGNESIUM HYDROXIDE (MILK OF MAGNESIA) 400 MG/5ML SUSPENSION    Take 30 mLs by mouth daily as needed for mild constipation.   METOPROLOL TARTRATE (LOPRESSOR) 25 MG TABLET    Take 0.5 tablets (12.5 mg total) by mouth 2 (two) times daily.   MULTIPLE VITAMIN (MULTIVITAMIN WITH MINERALS) TABS TABLET    Take 1 tablet by mouth daily.   NON FORMULARY    Med Pass 128ml by mouth two times daily  Modified Medications   No medications on file  Discontinued Medications   No medications on file     Physical Exam:  Vitals:   06/01/16 0845  BP: (!) 144/82  Pulse: 72  Resp: 20  Temp: 98.2 F (36.8 C)  SpO2: 98%  Weight: 144 lb (65.3 kg)  Height: 5\' 8"  (1.727 m)   Body mass index is 21.9 kg/m.  Physical Exam  Constitutional: No distress.  Frail, elderly male  HENT:  Head: Normocephalic and atraumatic.  Eyes: Conjunctivae are normal. Pupils are equal, round, and reactive to light.  Neck: Normal range of motion. Neck supple.  Cardiovascular: Normal rate, regular rhythm, normal heart sounds and intact distal pulses.   No murmur heard. Pulmonary/Chest: Effort normal and breath sounds normal. No respiratory distress. He has no wheezes.  Abdominal: Soft. Bowel sounds are normal. He exhibits no distension. There is no tenderness.  Neurological: He is alert.  Alert and oriented to person and place. One day discrepancy with the date. Overall intact  Skin: Skin is warm and dry.  Psychiatric:  Mild flat affect    Labs reviewed: Basic Metabolic Panel:  Recent Labs  06/11/15 10/12/15 04/27/16  NA 137 138 137  K 4.0 4.3 4.0  BUN 8 11 13   CREATININE 0.8 0.8 1.0  TSH  --   --  0.55   Liver Function Tests:  Recent  Labs  10/12/15 04/27/16  AST 9* 16  ALT 6* 10  ALKPHOS 75 76   No results for input(s): LIPASE, AMYLASE in the last 8760 hours. No results for input(s): AMMONIA in the last 8760 hours. CBC:  Recent Labs  06/11/15 12/19/15 04/27/16  WBC 6.8 7.6 6.3  HGB 11.8* 12.2* 14.1  HCT 37* 37* 44  PLT 361 329 208   Lipid Panel:  Recent Labs  06/11/15 04/27/16  CHOL 114 104  HDL 33* 31*  LDLCALC 62 57  TRIG 96 79   TSH:  Recent Labs  04/27/16  TSH 0.55   A1C: Lab Results  Component Value Date   HGBA1C 5.0 11/27/2014     Assessment/Plan 1. PAD (peripheral artery disease) (HCC) Stable. Being followed by podiatry. Plavix restarted post procedure, 04/22/16. CBC stable, Hb=14.1 04/27/16   2. Essential hypertension Mildly elevated today 144/82, before daily medication. Last three readings: 140/78,  98/59, 124/74. On lopressor 25mg .  3. Dementia without behavioral disturbance, unspecified dementia type Stable. Aricept started 04/2016. Pt reports responding well. No acute changes in cognitive or behavioral status.   4. Tobacco abuse Continues to smoke. Reports that he has cut back. Cessation encouraged.   5. Moderate protein-calorie malnutrition (HCC) Stable. Current weight 144lb 06/01/16. Last weights: 143lb 04/26/16, 143lb 04/21/16, 149 lb 21/1/17, 144lb 02/18/16. Reports adequate dietary intake and appetite with supplements in between meals. Poor dentition. No pain or problems with swallowing. On the dental list at [facility]. Negative for GI symptoms. CMP, WNL. TSH=0.55 04/27/2016    Carlos American. Harle Battiest  [facility] [phone] 8 am - 5 pm) [phone] (after hours)

## 2016-06-28 ENCOUNTER — Non-Acute Institutional Stay (SKILLED_NURSING_FACILITY): Payer: Medicare Other | Admitting: Nurse Practitioner

## 2016-06-28 ENCOUNTER — Encounter: Payer: Self-pay | Admitting: Podiatry

## 2016-06-28 ENCOUNTER — Ambulatory Visit (INDEPENDENT_AMBULATORY_CARE_PROVIDER_SITE_OTHER): Payer: Medicare Other | Admitting: Podiatry

## 2016-06-28 ENCOUNTER — Encounter: Payer: Self-pay | Admitting: Nurse Practitioner

## 2016-06-28 VITALS — BP 112/66 | HR 55 | Resp 18

## 2016-06-28 DIAGNOSIS — I739 Peripheral vascular disease, unspecified: Secondary | ICD-10-CM

## 2016-06-28 DIAGNOSIS — F0390 Unspecified dementia without behavioral disturbance: Secondary | ICD-10-CM

## 2016-06-28 DIAGNOSIS — Z720 Tobacco use: Secondary | ICD-10-CM

## 2016-06-28 DIAGNOSIS — I10 Essential (primary) hypertension: Secondary | ICD-10-CM

## 2016-06-28 DIAGNOSIS — M79674 Pain in right toe(s): Secondary | ICD-10-CM

## 2016-06-28 DIAGNOSIS — E440 Moderate protein-calorie malnutrition: Secondary | ICD-10-CM | POA: Diagnosis not present

## 2016-06-28 DIAGNOSIS — B351 Tinea unguium: Secondary | ICD-10-CM | POA: Diagnosis not present

## 2016-06-28 NOTE — Progress Notes (Signed)
Careteam: Patient Care Team: Hendricks Limes, MD as PCP - General (Internal Medicine)  Advanced Directive information Does Patient Have a Medical Advance Directive?: No  No Known Allergies  Chief Complaint  Patient presents with  . Medical Management of Chronic Issues    Resident is being seen for a routine visit.      HPI: Patient is a 76 y.o. male seen at [facility] today for medical management of chronic conditions. He has a past medical history of ETOH, PAD with gangrenous left toe s/p amputation, HTN, ARF, and current smoker. He has no complaints today. He saw podiatry today for follow up related to mycotic toenails and left toe amputation. he reports that the visit went well. He is currently on the list to be seen by facility dentist for poor dentition. Reports he continues to cut back on his smoking. Weight is up 2.8lbs from 06/01/16. Reports that he is eating good and taking supplements in between meals. Aricept started due to mild dementia with memory loss. He reports that this has been helping with his recall. Denies problems with bowels or urination. Admits to some difficulty falling asleep at night, however has denied help for this, as it is not severely bothersome.   Review of Systems:  Review of Systems  Constitutional: Negative for activity change, appetite change, chills, fatigue, fever and unexpected weight change.  HENT: Positive for dental problem. Negative for congestion, ear pain, rhinorrhea, sinus pain, sinus pressure, sneezing and sore throat.   Respiratory: Negative for cough, chest tightness, shortness of breath and wheezing.   Cardiovascular: Negative for chest pain, palpitations and leg swelling.  Gastrointestinal: Negative for abdominal pain, constipation, diarrhea, nausea and vomiting.  Genitourinary: Negative for difficulty urinating, frequency and urgency.  Musculoskeletal: Negative for back pain and gait problem.  Skin: Negative.   Neurological:  Negative for weakness, numbness and headaches.  Psychiatric/Behavioral: Positive for sleep disturbance. Negative for agitation, confusion and decreased concentration.       Difficulty falling asleep at times    Past Medical History:  Diagnosis Date  . Acute renal failure ([place]) 11/2014   from recent hospitalization notes  . Alcoholic ([place])   . AVM (arteriovenous malformation) of colon    colonoscopy 2011 [facility]   . Cardiomyopathy ([place])    from recent hospitalization notes  . Confusion   . Dementia   . Gangrene of digit    left toe  . Leukocytosis   . PAD (peripheral artery disease) ([place])   . Pulmonary hypertension   . Seizures ([place])     1 time seizure on 12/28/14 - seen in ED  . Tobacco dependence    Past Surgical History:  Procedure Laterality Date  . AMPUTATION Left 01/01/2015   Procedure: Left Transmetatarsal Amputation;  Surgeon: Newt Minion, MD;  Location: [place];  Service: Orthopedics;  Laterality: Left;  . LOWER EXTREMITY ANGIOGRAM Bilateral 12/14/2014   Procedure: Lower Extremity Angiogram;  Surgeon: Elam Dutch, MD;  Location: [place] [facility];  Service: Cardiovascular;  Laterality: Bilateral;  . PERIPHERAL VASCULAR CATHETERIZATION N/A 12/14/2014   Procedure: Abdominal Aortogram;  Surgeon: Elam Dutch, MD;  Location: [place] [facility];  Service: Cardiovascular;  Laterality: N/A;  . PERIPHERAL VASCULAR CATHETERIZATION Left 12/14/2014   Procedure: Peripheral Vascular Intervention;  Surgeon: Elam Dutch, MD;  Location: [place] [facility];  Service: Cardiovascular;  Laterality: Left;  SFA   Social History:   reports that he has been smoking Cigarettes.  He has a 13.75 pack-year smoking history. He has never used smokeless tobacco. He reports that he does not drink alcohol or use drugs.  Family History  Problem Relation Age of Onset  . Stroke Sister   . Deep vein thrombosis Sister   . Cancer Brother   . Diabetes Brother   . Hyperlipidemia Brother   .  Hypertension Brother   . Heart disease Father   . Hypertension Sister     Medications: Patient's Medications  New Prescriptions   No medications on file  Previous Medications   ACETAMINOPHEN (TYLENOL) 325 MG TABLET    Take 650 mg by mouth every 6 (six) hours as needed for headache.   BISACODYL (DULCOLAX) 10 MG SUPPOSITORY    Place 10 mg rectally daily as needed for mild constipation or moderate constipation (if not relieved by MOM). Reported on 07/15/2015   CLOPIDOGREL (PLAVIX) 75 MG TABLET    Take 1 tablet (75 mg total) by mouth daily with breakfast.   DONEPEZIL (ARICEPT) 10 MG TABLET    Take 10 mg by mouth at bedtime.   MAGNESIUM HYDROXIDE (MILK OF MAGNESIA) 400 MG/5ML SUSPENSION    Take 30 mLs by mouth daily as needed for mild constipation.   METOPROLOL TARTRATE (LOPRESSOR) 25 MG TABLET    Take 0.5 tablets (12.5 mg total) by mouth 2 (two) times daily.   MULTIPLE VITAMIN (MULTIVITAMIN WITH MINERALS) TABS TABLET    Take 1 tablet by mouth daily.   NON FORMULARY    Med Pass 120ml by mouth two times daily  Modified Medications   No medications on file  Discontinued Medications   No medications on file     Physical Exam:  Vitals:   06/28/16 0952  BP: 130/84  Pulse: 64  Resp: 19  Temp: 97 F (36.1 C)  SpO2: 96%  Weight: 146 lb 12.8 oz (66.6 kg)  Height: 5\' 8"  (1.727 m)   Body mass index is 22.32 kg/m.  Physical Exam  Constitutional: He is oriented to person, place, and time. No distress.  Thin, Frail, elderly male  HENT:  Head: Normocephalic and atraumatic.  Mouth/Throat: Oropharynx is clear and moist.  Poor dentition   Eyes: Conjunctivae are normal. Pupils are equal, round, and reactive to light.  Neck: Normal range of motion. Neck supple. No JVD present.  Cardiovascular: Normal rate, regular rhythm, normal heart sounds and intact distal pulses.   No murmur heard. Pulmonary/Chest: Effort normal and breath sounds normal. No respiratory distress. He has no wheezes.    Abdominal: Soft. Bowel sounds are normal. He exhibits no distension. There is no tenderness. There is no guarding.  Musculoskeletal: Normal range of motion. He exhibits no edema or tenderness.  Neurological: He is alert and oriented to person, place, and time.  Alert and oriented to person and place. One day discrepancy with the date. Overall intact  Skin: Skin is warm and dry.  Psychiatric: He has a normal mood and affect. His behavior is normal.  Mild flat affect    Labs reviewed: Basic Metabolic Panel:  Recent Labs  10/12/15 04/27/16  NA 138 137  K 4.3 4.0  BUN 11 13  CREATININE 0.8 1.0  TSH  --  0.55   Liver Function Tests:  Recent Labs  10/12/15 04/27/16  AST 9* 16  ALT 6* 10  ALKPHOS 75 76   No results for input(s): LIPASE, AMYLASE in the last 8760 hours. No results for input(s): AMMONIA in the last 8760 hours. CBC:  Recent Labs  12/19/15 04/27/16  WBC 7.6 6.3  HGB 12.2* 14.1  HCT 37* 44  PLT 329 208   Lipid Panel:  Recent Labs  04/27/16  CHOL 104  HDL 31*  LDLCALC 57  TRIG 79   TSH:  Recent Labs  04/27/16  TSH 0.55   A1C: Lab Results  Component Value Date   HGBA1C 5.0 11/27/2014     Assessment/Plan 1. Tobacco abuse -Continues to smoke. Reports that he has cut back. Cessation encouraged.   2. Moderate protein-calorie malnutrition ([place]) -Stable. Current weight 146.8lb 06/28/16. Last weights: 144lb 06/01/16, 143lb 04/26/16. Reports adequate dietary intake and appetite with supplements in between meals. Poor dentition. No pain or problems with swallowing. On the dental list at [facility]. Negative for GI symptoms. CMP=WNL. TSH=0.55 04/27/2016  3. Dementia without behavioral disturbance, unspecified dementia type -Stable. Aricept started 04/2016. Pt reports responding well. No acute changes in cognitive or behavioral status.    4. PAD (peripheral artery disease) (HCC) -Stable. Being followed by podiatry, today. No new concerns. Plavix restarted  post amputation with no apparent problems, 04/22/16. CBC stable, Hb=14.1 04/27/16   5. Essential hypertension -Stable. BP readings better today. Bp=130/84.Last three readings: 140/78, 98/59, 124/74. On lopressor 25mg .   Carlos American. Harle Battiest  [facility] [phone] 8 am - 5 pm) [phone] (after hours)

## 2016-06-28 NOTE — Patient Instructions (Signed)
Return every 3 months for debridement of mycotic toenails on the right foot  [place] 06/28/2016

## 2016-06-28 NOTE — Progress Notes (Signed)
Patient ID: Leon Foster, male   DOB: Oct 10, 1940, 76 y.o.   MRN: [id number]    Subjective: This patient presents for scheduled visit complaining of uncomfortable toenails and right foot when walking wearing shoes and requests toenail debridement.   Objective: Vascular: DP pulses 0/4 bilaterally PT pulses 0/4 bilaterally  Neurological: Sensation to 10 g monofilament wire intact 5/5 bilaterally Vibratory sensation nonreactive bilaterally Ankle reflex equal and reactive bilaterally  Dermatological: Open skin lesions bilaterally Toenails 1-5 are elongated, hypertrophic, deformed and tender to direct palpation  Musculoskeletal: Transmetatarsal amputation left foot well-healed incision site HAV right Hammertoe second and third right Ankle motion 90, bilaterally  Assessment: Absent pedal pulses consistent with known history of peripheral arterial disease Transmetatarsal amputation left Symptomatic onychomycosis 1-5 right HAV right Hammertoe second right  Plan: Debridement toenails 1-5 right mechanically an electrical without any bleeding  Reappoint 3 months

## 2016-07-21 ENCOUNTER — Non-Acute Institutional Stay (SKILLED_NURSING_FACILITY): Payer: Medicare Other | Admitting: Nurse Practitioner

## 2016-07-21 ENCOUNTER — Encounter: Payer: Self-pay | Admitting: Nurse Practitioner

## 2016-07-21 DIAGNOSIS — I43 Cardiomyopathy in diseases classified elsewhere: Secondary | ICD-10-CM

## 2016-07-21 DIAGNOSIS — Z720 Tobacco use: Secondary | ICD-10-CM | POA: Diagnosis not present

## 2016-07-21 DIAGNOSIS — D638 Anemia in other chronic diseases classified elsewhere: Secondary | ICD-10-CM

## 2016-07-21 DIAGNOSIS — I119 Hypertensive heart disease without heart failure: Secondary | ICD-10-CM | POA: Diagnosis not present

## 2016-07-21 DIAGNOSIS — F0390 Unspecified dementia without behavioral disturbance: Secondary | ICD-10-CM

## 2016-07-21 DIAGNOSIS — I739 Peripheral vascular disease, unspecified: Secondary | ICD-10-CM

## 2016-07-21 NOTE — Progress Notes (Signed)
Patient ID: BOBBY BARTON, male   DOB: 1940/07/22, 76 y.o.   MRN: [id number]    Nursing Home Location:  [facility]  Place of Service: SNF (31)  PCP: [name], MD  No Known Allergies  Chief Complaint  Patient presents with  . Medical Management of Chronic Issues    Resident is being seen for a routine visit.     HPI:  Patient is a 76 y.o. male seen today at [facility] for medical management of his chronic issues. 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 Pt has been doing well in the last month. There has been no acute issues. Pt has no complaints. Denies pains. Weight has been stable over the last month. No shortness of breath or chest pains.   Review of Systems:  Review of Systems  Constitutional: Negative for chills, fatigue and fever.  HENT: Negative for congestion, postnasal drip, rhinorrhea and sore throat.   Respiratory: Negative for cough, shortness of breath and wheezing.   Cardiovascular: Negative for chest pain, palpitations and leg swelling.  Gastrointestinal: Negative for abdominal pain, constipation and diarrhea.  Genitourinary: Negative for dysuria, frequency and urgency.  Musculoskeletal: Negative for arthralgias and myalgias.  Skin: Negative.   Neurological: Negative for dizziness, light-headedness and headaches.  Psychiatric/Behavioral: Positive for confusion. Negative for agitation and self-injury. The patient is not nervous/anxious.     Past Medical History:  Diagnosis Date  . Acute renal failure ([place]) 11/2014   from recent hospitalization notes  . Alcoholic ([place])   . AVM (arteriovenous malformation) of colon    colonoscopy 2011 [facility]   . Cardiomyopathy ([place])    from recent hospitalization notes  . Confusion   . Dementia   . Gangrene of digit    left toe  . Leukocytosis   . PAD (peripheral artery disease) ([place])   . Pulmonary hypertension     . Seizures ([place])     1 time seizure on 12/28/14 - seen in ED  . Tobacco dependence    Past Surgical History:  Procedure Laterality Date  . AMPUTATION Left 01/01/2015   Procedure: Left Transmetatarsal Amputation;  Surgeon: Newt Minion, MD;  Location: [place];  Service: Orthopedics;  Laterality: Left;  . LOWER EXTREMITY ANGIOGRAM Bilateral 12/14/2014   Procedure: Lower Extremity Angiogram;  Surgeon: Elam Dutch, MD;  Location: [place] [facility];  Service: Cardiovascular;  Laterality: Bilateral;  . PERIPHERAL VASCULAR CATHETERIZATION N/A 12/14/2014   Procedure: Abdominal Aortogram;  Surgeon: Elam Dutch, MD;  Location: [place] [facility];  Service: Cardiovascular;  Laterality: N/A;  . PERIPHERAL VASCULAR CATHETERIZATION Left 12/14/2014   Procedure: Peripheral Vascular Intervention;  Surgeon: Elam Dutch, MD;  Location: [place] [facility];  Service: Cardiovascular;  Laterality: Left;  SFA   Social History:   reports that he has been smoking Cigarettes.  He has a 13.75 pack-year smoking history. He has never used smokeless tobacco. He reports that he does not drink alcohol or use drugs.  Family History  Problem Relation Age of Onset  . Stroke Sister   . Deep vein thrombosis Sister   . Cancer Brother   . Diabetes Brother   . Hyperlipidemia Brother   . Hypertension Brother   . Heart disease Father   . Hypertension Sister     Medications: Patient's Medications  New Prescriptions   No medications on file  Previous Medications   ACETAMINOPHEN (TYLENOL) 325 MG TABLET    Take 650 mg by mouth every 6 (six) hours as needed for headache.   BISACODYL (DULCOLAX) 10 MG SUPPOSITORY    Place 10 mg rectally daily as needed for mild constipation or moderate constipation (if not relieved by MOM). Reported on 07/15/2015   CLOPIDOGREL (PLAVIX) 75 MG TABLET    Take 1 tablet (75 mg total) by mouth daily with breakfast.   DONEPEZIL (ARICEPT) 10 MG TABLET    Take 10 mg by mouth at bedtime.    MAGNESIUM HYDROXIDE (MILK OF MAGNESIA) 400 MG/5ML SUSPENSION    Take 30 mLs by mouth daily as needed for mild constipation.   METOPROLOL TARTRATE (LOPRESSOR) 25 MG TABLET    Take 0.5 tablets (12.5 mg total) by mouth 2 (two) times daily.   MULTIPLE VITAMIN (MULTIVITAMIN WITH MINERALS) TABS TABLET    Take 1 tablet by mouth daily.   NON FORMULARY    Med Pass 165ml by mouth two times daily  Modified Medications   No medications on file  Discontinued Medications   No medications on file     Physical Exam: Vitals:   07/21/16 1419  BP: 128/82  Pulse: 68  Resp: 20  Temp: 97.6 F (36.4 C)  Weight: 146 lb (66.2 kg)  Height: 5\' 8"  (1.727 m)    Physical Exam  Constitutional: No distress.  Frail elderly male   HENT:  Head: Normocephalic and atraumatic.  Poor detention   Eyes: Pupils are equal, round, and reactive to light.  Neck: Normal range of motion. Neck supple.  Cardiovascular: Normal rate, regular rhythm and normal heart sounds.   Pulmonary/Chest: Effort normal and breath sounds normal.  Abdominal: Soft. Bowel sounds are normal.  Musculoskeletal: He exhibits no edema or tenderness.  Neurological: He is alert.  Memory loss noted, ORIENTED to place   Skin: Skin is warm and dry. He is not diaphoretic.  Psychiatric: His affect is blunt.    Labs reviewed: Basic Metabolic Panel:  Recent Labs  10/12/15 04/27/16  NA 138 137  K 4.3 4.0  BUN 11 13  CREATININE 0.8 1.0   Liver Function Tests:  Recent Labs  10/12/15 04/27/16  AST 9* 16  ALT 6* 10  ALKPHOS 75 76   No results for input(s): LIPASE, AMYLASE in the last 8760 hours. No results for input(s): AMMONIA in the last 8760 hours. CBC:  Recent Labs  12/19/15 04/27/16  WBC 7.6 6.3  HGB 12.2* 14.1  HCT 37* 44  PLT 329 208   TSH:  Recent Labs  04/27/16  TSH 0.55   A1C: Lab Results  Component Value Date   HGBA1C 5.0 11/27/2014   Lipid Panel:  Recent Labs  04/27/16  CHOL 104  HDL 31*  LDLCALC 57  TRIG  79    Radiological Exams: No results found.   Assessment/Plan 1. Cardiomyopathy due to hypertension, without heart failure (HCC) Stable, to cont lopressor BID, without chest pains or shortness of breath  2. PAD (peripheral artery disease) (HCC) Stable, followed by podiatry, conts on plavix, no claudication noted.   3. Anemia, chronic disease Stable on last labs, no signs of blood loss or bleeding.   4. Dementia without behavioral disturbance, unspecified dementia type conts on aricept, without acute change at this time.   5. Tobacco abuse conts to smoke, encourage cessation.   Carlos American. Harle Battiest  [facility] [phone] 8 am - 5 pm) [phone] (after  hours)

## 2016-08-25 ENCOUNTER — Encounter: Payer: Self-pay | Admitting: Nurse Practitioner

## 2016-08-25 ENCOUNTER — Non-Acute Institutional Stay (SKILLED_NURSING_FACILITY): Payer: Medicare Other | Admitting: Nurse Practitioner

## 2016-08-25 DIAGNOSIS — I739 Peripheral vascular disease, unspecified: Secondary | ICD-10-CM | POA: Diagnosis not present

## 2016-08-25 DIAGNOSIS — F0390 Unspecified dementia without behavioral disturbance: Secondary | ICD-10-CM | POA: Diagnosis not present

## 2016-08-25 DIAGNOSIS — E440 Moderate protein-calorie malnutrition: Secondary | ICD-10-CM | POA: Diagnosis not present

## 2016-08-25 DIAGNOSIS — I10 Essential (primary) hypertension: Secondary | ICD-10-CM

## 2016-08-25 DIAGNOSIS — Z89432 Acquired absence of left foot: Secondary | ICD-10-CM

## 2016-08-25 NOTE — Progress Notes (Signed)
Patient ID: Leon Foster, male   DOB: [birth year], 76 y.o.   MRN: [id number]    Nursing Home Location:  [facility]  Place of Service: SNF (31)  PCP: Hendricks Limes, MD  No Known Allergies  Chief Complaint  Patient presents with  . Medical Management of Chronic Issues    HPI:  Patient is a 76 y.o. male seen today at [facility] for medical management of his chronic issues. 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 Pt has been doing well in the last month. He has no complaints. Staff without concerns.  Conts to follow up with podiatrist for foot care. No new wounds. No pain noted.    Review of Systems:  Review of Systems  Constitutional: Negative for chills, fatigue and fever.  HENT: Negative for congestion, postnasal drip, rhinorrhea and sore throat.   Respiratory: Negative for cough, shortness of breath and wheezing.   Cardiovascular: Negative for chest pain, palpitations and leg swelling.  Gastrointestinal: Negative for abdominal pain, constipation and diarrhea.  Genitourinary: Negative for dysuria, frequency and urgency.  Musculoskeletal: Negative for arthralgias and myalgias.  Skin: Negative.   Neurological: Negative for dizziness, light-headedness and headaches.  Psychiatric/Behavioral: Positive for confusion. Negative for agitation and self-injury. The patient is not nervous/anxious.     Past Medical History:  Diagnosis Date  . Acute renal failure ([place]) 11/2014   from recent hospitalization notes  . Alcoholic ([place])   . AVM (arteriovenous malformation) of colon    colonoscopy 2011 [facility]   . Cardiomyopathy ([place])    from recent hospitalization notes  . Confusion   . Dementia   . Gangrene of digit    left toe  . Leukocytosis   . PAD (peripheral artery disease) ([place])   . Pulmonary hypertension ([place])   . Seizures ([place])     1 time seizure on 12/28/14 - seen in ED    . Tobacco dependence    Past Surgical History:  Procedure Laterality Date  . AMPUTATION Left 01/01/2015   Procedure: Left Transmetatarsal Amputation;  Surgeon: Newt Minion, MD;  Location: [place];  Service: Orthopedics;  Laterality: Left;  . LOWER EXTREMITY ANGIOGRAM Bilateral 12/14/2014   Procedure: Lower Extremity Angiogram;  Surgeon: Elam Dutch, MD;  Location: [place] [facility];  Service: Cardiovascular;  Laterality: Bilateral;  . PERIPHERAL VASCULAR CATHETERIZATION N/A 12/14/2014   Procedure: Abdominal Aortogram;  Surgeon: Elam Dutch, MD;  Location: [place] [facility];  Service: Cardiovascular;  Laterality: N/A;  . PERIPHERAL VASCULAR CATHETERIZATION Left 12/14/2014   Procedure: Peripheral Vascular Intervention;  Surgeon: Elam Dutch, MD;  Location: [place] [facility];  Service: Cardiovascular;  Laterality: Left;  SFA   Social History:   reports that he has been smoking Cigarettes.  He has a 13.75 pack-year smoking history. He has never used smokeless tobacco. He reports that he does not drink alcohol or use drugs.  Family History  Problem Relation Age of Onset  . Stroke Sister   . Deep vein thrombosis Sister   . Cancer Brother   . Diabetes Brother   . Hyperlipidemia Brother   . Hypertension Brother   . Heart disease Father   . Hypertension Sister     Medications: Patient's Medications  New Prescriptions   No medications on file  Previous Medications   ACETAMINOPHEN (TYLENOL) 325 MG TABLET    Take  650 mg by mouth every 6 (six) hours as needed for headache.   BISACODYL (DULCOLAX) 10 MG SUPPOSITORY    Place 10 mg rectally daily as needed for mild constipation or moderate constipation (if not relieved by MOM). Reported on 07/15/2015   CLOPIDOGREL (PLAVIX) 75 MG TABLET    Take 1 tablet (75 mg total) by mouth daily with breakfast.   DONEPEZIL (ARICEPT) 10 MG TABLET    Take 10 mg by mouth at bedtime.    MAGNESIUM HYDROXIDE (MILK OF MAGNESIA) 400 MG/5ML SUSPENSION     Take 30 mLs by mouth daily as needed for mild constipation.   METOPROLOL TARTRATE (LOPRESSOR) 25 MG TABLET    Take 0.5 tablets (12.5 mg total) by mouth 2 (two) times daily.   MULTIPLE VITAMIN (MULTIVITAMIN WITH MINERALS) TABS TABLET    Take 1 tablet by mouth daily.   NON FORMULARY    Med Pass 18ml by mouth two times daily  Modified Medications   No medications on file  Discontinued Medications   No medications on file     Physical Exam: Vitals:   08/25/16 1124  BP: 136/76  Pulse: 74  Resp: 20  Temp: 98.7 F (37.1 C)  TempSrc: Oral  SpO2: 100%  Weight: 146 lb (66.2 kg)  Height: 5\' 8"  (1.727 m)    Physical Exam  Constitutional: No distress.  Frail elderly male   HENT:  Head: Normocephalic and atraumatic.  Poor detention   Eyes: Pupils are equal, round, and reactive to light.  Neck: Normal range of motion. Neck supple.  Cardiovascular: Normal rate, regular rhythm and normal heart sounds.   Pulmonary/Chest: Effort normal and breath sounds normal.  Abdominal: Soft. Bowel sounds are normal.  Musculoskeletal: He exhibits no edema or tenderness.  Neurological: He is alert.  Memory loss noted, ORIENTED to place   Skin: Skin is warm and dry. He is not diaphoretic.  Psychiatric: His affect is blunt.    Labs reviewed: Basic Metabolic Panel:  Recent Labs  10/12/15 04/27/16  NA 138 137  K 4.3 4.0  BUN 11 13  CREATININE 0.8 1.0   Liver Function Tests:  Recent Labs  10/12/15 04/27/16  AST 9* 16  ALT 6* 10  ALKPHOS 75 76   No results for input(s): LIPASE, AMYLASE in the last 8760 hours. No results for input(s): AMMONIA in the last 8760 hours. CBC:  Recent Labs  12/19/15 04/27/16  WBC 7.6 6.3  HGB 12.2* 14.1  HCT 37* 44  PLT 329 208   TSH:  Recent Labs  04/27/16  TSH 0.55   A1C: Lab Results  Component Value Date   HGBA1C 5.0 11/27/2014   Lipid Panel:  Recent Labs  04/27/16  CHOL 104  HDL 31*  LDLCALC 57  TRIG 79    Radiological Exams: No  results found.   Assessment/Plan 1. Essential hypertension Blood pressure stable, no currently requiring medication, will cont to monitor.   2. PAD (peripheral artery disease) (HCC) Stable, conts on plavix.  3. Status post transmetatarsal amputation of left foot (HCC) Stable, cont to follow up with podiatry for foot care  4. Moderate protein-calorie malnutrition (HCC) Weight remains stable. Cont on nutritional supplements.   5. Dementia without behavioral disturbance, unspecified dementia type Stable on aricept, without changes to cognitive or behavioral status.   Carlos American. Harle Battiest  [facility] [phone] 8 am - 5 pm) [phone] (after hours)

## 2016-09-27 ENCOUNTER — Non-Acute Institutional Stay (SKILLED_NURSING_FACILITY): Payer: Medicare Other | Admitting: Nurse Practitioner

## 2016-09-27 ENCOUNTER — Encounter: Payer: Self-pay | Admitting: Nurse Practitioner

## 2016-09-27 DIAGNOSIS — Z720 Tobacco use: Secondary | ICD-10-CM

## 2016-09-27 DIAGNOSIS — F0390 Unspecified dementia without behavioral disturbance: Secondary | ICD-10-CM | POA: Diagnosis not present

## 2016-09-27 DIAGNOSIS — I119 Hypertensive heart disease without heart failure: Secondary | ICD-10-CM | POA: Diagnosis not present

## 2016-09-27 DIAGNOSIS — I43 Cardiomyopathy in diseases classified elsewhere: Secondary | ICD-10-CM

## 2016-09-27 DIAGNOSIS — I739 Peripheral vascular disease, unspecified: Secondary | ICD-10-CM | POA: Diagnosis not present

## 2016-09-27 DIAGNOSIS — R351 Nocturia: Secondary | ICD-10-CM

## 2016-09-27 NOTE — Progress Notes (Signed)
Patient ID: Leon Foster, male   DOB: [birth year], 76 y.o.   MRN: [id number]    Nursing Home Location:  [facility]  Place of Service: SNF (31)  PCP: Hendricks Limes, MD  No Known Allergies  Chief Complaint  Patient presents with  . Medical Management of Chronic Issues    Routine    HPI:  Patient is a 76 y.o. male seen today at [facility] for medical management of his chronic issues. 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  Pt reports he has cut down to 4 cigarettes a day and trying to cut back more.  Has no complaints and nursing without concerns. conts to ambulate around facility and helps other residents in [facility].  Reports he is sleeping well. States his appetite is good.    Review of Systems:  Review of Systems  Constitutional: Negative for chills, fatigue and fever.  HENT: Negative for congestion, postnasal drip, rhinorrhea and sore throat.   Respiratory: Negative for cough, shortness of breath and wheezing.   Cardiovascular: Negative for chest pain, palpitations and leg swelling.  Gastrointestinal: Negative for abdominal pain, constipation and diarrhea.  Genitourinary: Positive for frequency. Negative for dysuria and urgency.  Musculoskeletal: Negative for arthralgias and myalgias.  Skin: Negative.   Neurological: Negative for dizziness, light-headedness and headaches.  Psychiatric/Behavioral: Positive for confusion. Negative for agitation and self-injury. The patient is not nervous/anxious.     Past Medical History:  Diagnosis Date  . Acute renal failure ([place]) 11/2014   from recent hospitalization notes  . Alcoholic ([place])   . AVM (arteriovenous malformation) of colon    colonoscopy 2011 [facility]   . Cardiomyopathy ([place])    from recent hospitalization notes  . Confusion   . Dementia   . Gangrene of digit    left toe  . Leukocytosis   . PAD (peripheral artery  disease) ([place])   . Pulmonary hypertension ([place])   . Seizures ([place])     1 time seizure on 12/28/14 - seen in ED  . Tobacco dependence    Past Surgical History:  Procedure Laterality Date  . AMPUTATION Left 01/01/2015   Procedure: Left Transmetatarsal Amputation;  Surgeon: Newt Minion, MD;  Location: [place];  Service: Orthopedics;  Laterality: Left;  . LOWER EXTREMITY ANGIOGRAM Bilateral 12/14/2014   Procedure: Lower Extremity Angiogram;  Surgeon: Elam Dutch, MD;  Location: [place] [facility];  Service: Cardiovascular;  Laterality: Bilateral;  . PERIPHERAL VASCULAR CATHETERIZATION N/A 12/14/2014   Procedure: Abdominal Aortogram;  Surgeon: Elam Dutch, MD;  Location: [place] [facility];  Service: Cardiovascular;  Laterality: N/A;  . PERIPHERAL VASCULAR CATHETERIZATION Left 12/14/2014   Procedure: Peripheral Vascular Intervention;  Surgeon: Elam Dutch, MD;  Location: [place] [facility];  Service: Cardiovascular;  Laterality: Left;  SFA   Social History:   reports that he has been smoking Cigarettes.  He has a 13.75 pack-year smoking history. He has never used smokeless tobacco. He reports that he does not drink alcohol or use drugs.  Family History  Problem Relation Age of Onset  . Stroke Sister   . Deep vein thrombosis Sister   . Cancer Brother   . Diabetes Brother   . Hyperlipidemia Brother   . Hypertension Brother   . Heart disease Father   . Hypertension Sister     Medications: Patient's Medications  New  Prescriptions   No medications on file  Previous Medications   ACETAMINOPHEN (TYLENOL) 325 MG TABLET    Take 650 mg by mouth every 6 (six) hours as needed for headache.   BISACODYL (DULCOLAX) 10 MG SUPPOSITORY    Place 10 mg rectally daily as needed for mild constipation or moderate constipation (if not relieved by MOM). Reported on 07/15/2015   CLOPIDOGREL (PLAVIX) 75 MG TABLET    Take 1 tablet (75 mg total) by mouth daily with breakfast.   DONEPEZIL (ARICEPT) 5  MG TABLET    Take 5 mg by mouth at bedtime.   MAGNESIUM HYDROXIDE (MILK OF MAGNESIA) 400 MG/5ML SUSPENSION    Take 30 mLs by mouth daily as needed for mild constipation.   METOPROLOL TARTRATE (LOPRESSOR) 25 MG TABLET    Take 0.5 tablets (12.5 mg total) by mouth 2 (two) times daily.   MULTIPLE VITAMIN (MULTIVITAMIN WITH MINERALS) TABS TABLET    Take 1 tablet by mouth daily.   NON FORMULARY    Med Pass 147ml by mouth two times daily  Modified Medications   No medications on file  Discontinued Medications   DONEPEZIL (ARICEPT) 10 MG TABLET    Take 10 mg by mouth at bedtime.      Physical Exam: Vitals:   09/27/16 0930  BP: 109/71  Pulse: 68  Resp: 20  Temp: 98.2 F (36.8 C)  TempSrc: Oral  SpO2: 98%  Weight: 145 lb 15.1 oz (66.2 kg)  Height: 5\' 8"  (1.727 m)    Physical Exam  Constitutional: No distress.  Frail elderly male   HENT:  Head: Normocephalic and atraumatic.  Poor detention   Eyes: Pupils are equal, round, and reactive to light.  Neck: Normal range of motion. Neck supple.  Cardiovascular: Normal rate, regular rhythm and normal heart sounds.   Pulmonary/Chest: Effort normal and breath sounds normal.  Abdominal: Soft. Bowel sounds are normal.  Musculoskeletal: He exhibits no edema or tenderness.  Neurological: He is alert.  Memory loss noted, ORIENTED to place   Skin: Skin is warm and dry. He is not diaphoretic.  Psychiatric: His affect is blunt.    Labs reviewed: Basic Metabolic Panel:  Recent Labs  10/12/15 04/27/16  NA 138 137  K 4.3 4.0  BUN 11 13  CREATININE 0.8 1.0   Liver Function Tests:  Recent Labs  10/12/15 04/27/16  AST 9* 16  ALT 6* 10  ALKPHOS 75 76   CBC:  Recent Labs  12/19/15 04/27/16  WBC 7.6 6.3  HGB 12.2* 14.1  HCT 37* 44  PLT 329 208   TSH:  Recent Labs  04/27/16  TSH 0.55   A1C: Lab Results  Component Value Date   HGBA1C 5.0 11/27/2014   Lipid Panel:  Recent Labs  04/27/16  CHOL 104  HDL 31*  LDLCALC 57    TRIG 79    Radiological Exams: No results found.   Assessment/Plan 1. Nocturia Increase urinary frequency at HS, unsure how long this has been going on for but reports worsening of symptoms after he drinks a lot before bed. Will follow up PSA and BMP at this time. My benefit from flomax if labs normal.    2. PAD (peripheral artery disease) (HCC) Stable, conts on plavix, will follow up CBC  3. Tobacco abuse Cont to smoke, encourated cessation.   4. Dementia without behavioral disturbance, unspecified dementia type Stable, will increase Aricept to full 10 mg dosing at this time. No significant changes in cognitive  or functional status.   5. Cardiomyopathy due to hypertension, without heart failure ([place]) -without shortness of breath or chest pains, blood pressure stable. Will cont metoprolol   Davinci Glotfelty K. Harle Battiest  [facility] & Adult Medicine [phone] 8 am - 5 pm) [phone] (after hours)

## 2016-09-29 LAB — CBC AND DIFFERENTIAL
HCT: 46 (ref 41–53)
HEMOGLOBIN: 14.8 (ref 13.5–17.5)
Neutrophils Absolute: 234
PLATELETS: 234 (ref 150–399)
WBC: 8.3

## 2016-09-29 LAB — BASIC METABOLIC PANEL
BUN: 13 (ref 4–21)
Creatinine: 0.9 (ref 0.6–1.3)
Glucose: 91
Potassium: 4.6 (ref 3.4–5.3)
Sodium: 140 (ref 137–147)

## 2016-10-24 ENCOUNTER — Non-Acute Institutional Stay (SKILLED_NURSING_FACILITY): Payer: Medicare Other | Admitting: Adult Health

## 2016-10-24 ENCOUNTER — Encounter: Payer: Self-pay | Admitting: Adult Health

## 2016-10-24 DIAGNOSIS — I739 Peripheral vascular disease, unspecified: Secondary | ICD-10-CM

## 2016-10-24 DIAGNOSIS — F0390 Unspecified dementia without behavioral disturbance: Secondary | ICD-10-CM | POA: Diagnosis not present

## 2016-10-24 DIAGNOSIS — Z720 Tobacco use: Secondary | ICD-10-CM

## 2016-10-24 DIAGNOSIS — I10 Essential (primary) hypertension: Secondary | ICD-10-CM

## 2016-10-24 NOTE — Progress Notes (Signed)
DATE:  10/24/2016   MRN:  [id number]  BIRTHDAY: 11/22/1940  Facility:  Nursing Home Location:  [facility] Room Number: 130-B  LEVEL OF CARE:  SNF (31)  Contact Information    Name Relation Home Work [place] Sister [phone]         Code Status History    Date Active Date Inactive Code Status Order ID Comments User Context   12/14/2014  6:20 PM 12/15/2014  5:22 PM Full Code [id number]  Elam Dutch, MD Inpatient   11/27/2014  5:29 PM 12/14/2014  6:20 PM Full Code [id number]  Modena Jansky, MD Inpatient       Chief Complaint  Patient presents with  . Medical Management of Chronic Issues    Routine visit    HISTORY OF PRESENT ILLNESS:  This is a 59-YO male seen for a routine visit.  He is a long-term care resident of [facility]. He was seen today in his room. He was attending activities in the dining room and then walked to his room. He has a cigarette pack in his shirt pocket. He verbalized smoking 4 sticks a day.  He denies any concerns. He has PMH of ETOH abuse, PAD, hypertension and history of gangrene of left foot toes S/P amputation.    PAST MEDICAL HISTORY:  Past Medical History:  Diagnosis Date  . Acute renal failure ([place]) 11/2014   from recent hospitalization notes  . Alcoholic ([place])   . AVM (arteriovenous malformation) of colon    colonoscopy 2011 [facility]   . Cardiomyopathy ([place])    from recent hospitalization notes  . Confusion   . Dementia   . Gangrene of digit    left toe  . Leukocytosis   . PAD (peripheral artery disease) ([place])   . Pulmonary hypertension ([place])   . Seizures ([place])     1 time seizure on 12/28/14 - seen in ED  . Tobacco dependence      CURRENT MEDICATIONS: Reviewed  Patient's Medications  New Prescriptions   No medications on file  Previous Medications   ACETAMINOPHEN (TYLENOL) 325 MG TABLET    Take 650 mg by mouth every 6 (six) hours as needed for  headache.   BISACODYL (DULCOLAX) 10 MG SUPPOSITORY    Place 10 mg rectally daily as needed for mild constipation or moderate constipation (if not relieved by MOM). Reported on 07/15/2015   CLOPIDOGREL (PLAVIX) 75 MG TABLET    Take 1 tablet (75 mg total) by mouth daily with breakfast.   DONEPEZIL (ARICEPT) 10 MG TABLET    Take 10 mg by mouth at bedtime.   MAGNESIUM HYDROXIDE (MILK OF MAGNESIA) 400 MG/5ML SUSPENSION    Take 30 mLs by mouth daily as needed for mild constipation.   METOPROLOL TARTRATE (LOPRESSOR) 25 MG TABLET    Take 0.5 tablets (12.5 mg total) by mouth 2 (two) times daily.   MULTIPLE VITAMIN (MULTIVITAMIN WITH MINERALS) TABS TABLET    Take 1 tablet by mouth daily.   NON FORMULARY    Med Pass 134ml by mouth two times daily  Modified Medications   No medications on file  Discontinued Medications   DONEPEZIL (ARICEPT) 5 MG TABLET    Take 5 mg by mouth at bedtime.     No Known Allergies   REVIEW OF SYSTEMS:  GENERAL: no change in appetite, no fatigue, no fever, chills or weakness EYES: Denies change in vision, dry eyes,  eye pain, itching or discharge EARS: Denies change in hearing, ringing in ears, or earache NOSE: Denies nasal congestion or epistaxis MOUTH and THROAT: Denies oral discomfort, gingival pain or bleeding, pain from teeth or hoarseness   RESPIRATORY: no cough, SOB, DOE, wheezing, hemoptysis CARDIAC: no chest pain, edema or palpitations GI: no abdominal pain, diarrhea, constipation, heart burn, nausea or vomiting GU: Denies dysuria, frequency, hematuria, incontinence, or discharge PSYCHIATRIC: Denies feeling of depression or anxiety. No report of hallucinations, insomnia, paranoia, or agitation    PHYSICAL EXAMINATION  GENERAL APPEARANCE:   In no acute distress.  SKIN:  Skin is warm and dry.  HEAD: Normal in size and contour. No evidence of trauma EYES: Lids open and close normally. No blepharitis, entropion or ectropion. PERRL. Conjunctivae are clear and  sclerae are white. Lenses are without opacity EARS: Pinnae are normal. Patient hears normal voice tunes of the examiner MOUTH and THROAT: Lips are without lesions. Oral mucosa is moist and without lesions. Tongue is normal in shape, size, and color and without lesions NECK: supple, trachea midline, no neck masses, no thyroid tenderness, no thyromegaly LYMPHATICS: no LAN in the neck, no supraclavicular LAN RESPIRATORY: breathing is even & unlabored, BS CTAB CARDIAC: RRR, no murmur,no extra heart sounds, no edema GI: abdomen soft, normal BS, no masses, no tenderness, no hepatomegaly, no splenomegaly EXTREMITIES:  Able to move X 4 extremities, walks in the facility PSYCHIATRIC: Alert to self and place, disoriented to time. Affect and behavior are appropriate   LABS/RADIOLOGY: Labs reviewed: Basic Metabolic Panel:  Recent Labs  04/27/16 09/29/16  NA 137 140  K 4.0 4.6  BUN 13 13  CREATININE 1.0 0.9   Liver Function Tests:  Recent Labs  04/27/16  AST 16  ALT 10  ALKPHOS 76   CBC:  Recent Labs  12/19/15 04/27/16 09/29/16  WBC 7.6 6.3 8.3  NEUTROABS  --   --  234  HGB 12.2* 14.1 14.8  HCT 37* 44 46  PLT 329 208 234   Lipid Panel:  Recent Labs  04/27/16  HDL 31*    ASSESSMENT/PLAN:  1. PAD (peripheral artery disease) (HCC) - stable; continue Plavix 75 mg 1 tab PO Q D   2. Essential hypertension - well-controlled; continue Metoprolol tartrate 25 mg give 1/2 tab = 12.5 mg PO BID   3. Dementia without behavioral disturbance, unspecified dementia type - continue Aricept 10 mg 1 tab PO Q HS   4. Tobacco abuse - counseling done     Goals of care:  Long-term care     Betsabe Iglesia C. Reedy - NP    [employer] [phone]

## 2016-11-09 ENCOUNTER — Non-Acute Institutional Stay (SKILLED_NURSING_FACILITY): Payer: Medicare Other

## 2016-11-09 DIAGNOSIS — Z0000 Encounter for general adult medical examination without abnormal findings: Secondary | ICD-10-CM

## 2016-11-09 NOTE — Patient Instructions (Signed)
Leon Foster , Thank you for taking time to come for your Medicare Wellness Visit. I appreciate your ongoing commitment to your health goals. Please review the following plan we discussed and let me know if I can assist you in the future.   Screening recommendations/referrals: Colonoscopy excluded, pt long term Recommended yearly ophthalmology/optometry visit for glaucoma screening and checkup Recommended yearly dental visit for hygiene and checkup  Vaccinations: Influenza vaccine up to date. Due 2018 fall season Pneumococcal vaccine 13 due, ordered Tdap vaccine up to date. Due 03/14/24 Shingles vaccine not in records  Advanced directives: Need a copy for records  Conditions/risks identified: None  Next appointment: Dr. Linna Darner makes rounds  Preventive Care 75 Years and Older, Male Preventive care refers to lifestyle choices and visits with your health care provider that can promote health and wellness. What does preventive care include?  A yearly physical exam. This is also called an annual well check.  Dental exams once or twice a year.  Routine eye exams. Ask your health care provider how often you should have your eyes checked.  Personal lifestyle choices, including:  Daily care of your teeth and gums.  Regular physical activity.  Eating a healthy diet.  Avoiding tobacco and drug use.  Limiting alcohol use.  Practicing safe sex.  Taking low doses of aspirin every day.  Taking vitamin and mineral supplements as recommended by your health care provider. What happens during an annual well check? The services and screenings done by your health care provider during your annual well check will depend on your age, overall health, lifestyle risk factors, and family history of disease. Counseling  Your health care provider may ask you questions about your:  Alcohol use.  Tobacco use.  Drug use.  Emotional well-being.  Home and relationship well-being.  Sexual  activity.  Eating habits.  History of falls.  Memory and ability to understand (cognition).  Work and work Statistician. Screening  You may have the following tests or measurements:  Height, weight, and BMI.  Blood pressure.  Lipid and cholesterol levels. These may be checked every 5 years, or more frequently if you are over 42 years old.  Skin check.  Lung cancer screening. You may have this screening every year starting at age 8 if you have a 30-pack-year history of smoking and currently smoke or have quit within the past 15 years.  Fecal occult blood test (FOBT) of the stool. You may have this test every year starting at age 80.  Flexible sigmoidoscopy or colonoscopy. You may have a sigmoidoscopy every 5 years or a colonoscopy every 10 years starting at age 84.  Prostate cancer screening. Recommendations will vary depending on your family history and other risks.  Hepatitis C blood test.  Hepatitis B blood test.  Sexually transmitted disease (STD) testing.  Diabetes screening. This is done by checking your blood sugar (glucose) after you have not eaten for a while (fasting). You may have this done every 1-3 years.  Abdominal aortic aneurysm (AAA) screening. You may need this if you are a current or former smoker.  Osteoporosis. You may be screened starting at age 39 if you are at high risk. Talk with your health care provider about your test results, treatment options, and if necessary, the need for more tests. Vaccines  Your health care provider may recommend certain vaccines, such as:  Influenza vaccine. This is recommended every year.  Tetanus, diphtheria, and acellular pertussis (Tdap, Td) vaccine. You may need a Td  booster every 10 years.  Zoster vaccine. You may need this after age 55.  Pneumococcal 13-valent conjugate (PCV13) vaccine. One dose is recommended after age 47.  Pneumococcal polysaccharide (PPSV23) vaccine. One dose is recommended after age  39. Talk to your health care provider about which screenings and vaccines you need and how often you need them. This information is not intended to replace advice given to you by your health care provider. Make sure you discuss any questions you have with your health care provider. Document Released: 04/30/2015 Document Revised: 12/22/2015 Document Reviewed: 02/02/2015 Elsevier Interactive Patient Education  2017 [employer] Prevention in the Home Falls can cause injuries. They can happen to people of all ages. There are many things you can do to make your home safe and to help prevent falls. What can I do on the outside of my home?  Regularly fix the edges of walkways and driveways and fix any cracks.  Remove anything that might make you trip as you walk through a door, such as a raised step or threshold.  Trim any bushes or trees on the path to your home.  Use bright outdoor lighting.  Clear any walking paths of anything that might make someone trip, such as rocks or tools.  Regularly check to see if handrails are loose or broken. Make sure that both sides of any steps have handrails.  Any raised decks and porches should have guardrails on the edges.  Have any leaves, snow, or ice cleared regularly.  Use sand or salt on walking paths during winter.  Clean up any spills in your garage right away. This includes oil or grease spills. What can I do in the bathroom?  Use night lights.  Install grab bars by the toilet and in the tub and shower. Do not use towel bars as grab bars.  Use non-skid mats or decals in the tub or shower.  If you need to sit down in the shower, use a plastic, non-slip stool.  Keep the floor dry. Clean up any water that spills on the floor as soon as it happens.  Remove soap buildup in the tub or shower regularly.  Attach bath mats securely with double-sided non-slip rug tape.  Do not have throw rugs and other things on the floor that can make  you trip. What can I do in the bedroom?  Use night lights.  Make sure that you have a light by your bed that is easy to reach.  Do not use any sheets or blankets that are too big for your bed. They should not hang down onto the floor.  Have a firm chair that has side arms. You can use this for support while you get dressed.  Do not have throw rugs and other things on the floor that can make you trip. What can I do in the kitchen?  Clean up any spills right away.  Avoid walking on wet floors.  Keep items that you use a lot in easy-to-reach places.  If you need to reach something above you, use a strong step stool that has a grab bar.  Keep electrical cords out of the way.  Do not use floor polish or wax that makes floors slippery. If you must use wax, use non-skid floor wax.  Do not have throw rugs and other things on the floor that can make you trip. What can I do with my stairs?  Do not leave any items on the stairs.  Make sure that there are handrails on both sides of the stairs and use them. Fix handrails that are broken or loose. Make sure that handrails are as long as the stairways.  Check any carpeting to make sure that it is firmly attached to the stairs. Fix any carpet that is loose or worn.  Avoid having throw rugs at the top or bottom of the stairs. If you do have throw rugs, attach them to the floor with carpet tape.  Make sure that you have a light switch at the top of the stairs and the bottom of the stairs. If you do not have them, ask someone to add them for you. What else can I do to help prevent falls?  Wear shoes that:  Do not have high heels.  Have rubber bottoms.  Are comfortable and fit you well.  Are closed at the toe. Do not wear sandals.  If you use a stepladder:  Make sure that it is fully opened. Do not climb a closed stepladder.  Make sure that both sides of the stepladder are locked into place.  Ask someone to hold it for you, if  possible.  Clearly mark and make sure that you can see:  Any grab bars or handrails.  First and last steps.  Where the edge of each step is.  Use tools that help you move around (mobility aids) if they are needed. These include:  Canes.  Walkers.  Scooters.  Crutches.  Turn on the lights when you go into a dark area. Replace any light bulbs as soon as they burn out.  Set up your furniture so you have a clear path. Avoid moving your furniture around.  If any of your floors are uneven, fix them.  If there are any pets around you, be aware of where they are.  Review your medicines with your doctor. Some medicines can make you feel dizzy. This can increase your chance of falling. Ask your doctor what other things that you can do to help prevent falls. This information is not intended to replace advice given to you by your health care provider. Make sure you discuss any questions you have with your health care provider. Document Released: 01/28/2009 Document Revised: 09/09/2015 Document Reviewed: 05/08/2014 Elsevier Interactive Patient Education  2017 [employer].

## 2016-11-09 NOTE — Progress Notes (Signed)
ERROR

## 2016-11-09 NOTE — Progress Notes (Signed)
Subjective:   Leon Foster is a 76 y.o. male who presents for an Initial Medicare Annual Wellness Visit at [facility]   Objective:    Today's Vitals   11/09/16 1500  BP: 130/60  Pulse: 70  Temp: 97.8 F (36.6 C)  TempSrc: Oral  SpO2: 98%  Weight: 147 lb (66.7 kg)  Height: 6' (1.829 m)   Body mass index is 19.94 kg/m.  Current Medications (verified) Outpatient Encounter Prescriptions as of 11/09/2016  Medication Sig  . acetaminophen (TYLENOL) 325 MG tablet Take 650 mg by mouth every 6 (six) hours as needed for headache.  . bisacodyl (DULCOLAX) 10 MG suppository Place 10 mg rectally daily as needed for mild constipation or moderate constipation (if not relieved by MOM). Reported on 07/15/2015  . clopidogrel (PLAVIX) 75 MG tablet Take 1 tablet (75 mg total) by mouth daily with breakfast.  . donepezil (ARICEPT) 10 MG tablet Take 10 mg by mouth at bedtime.  . magnesium hydroxide (MILK OF MAGNESIA) 400 MG/5ML suspension Take 30 mLs by mouth daily as needed for mild constipation.  . metoprolol tartrate (LOPRESSOR) 25 MG tablet Take 0.5 tablets (12.5 mg total) by mouth 2 (two) times daily.  . Multiple Vitamin (MULTIVITAMIN WITH MINERALS) TABS tablet Take 1 tablet by mouth daily.  . NON FORMULARY Med Pass 156ml by mouth two times daily   No facility-administered encounter medications on file as of 11/09/2016.     Allergies (verified) Patient has no known allergies.   History: Past Medical History:  Diagnosis Date  . Acute renal failure ([place]) 11/2014   from recent hospitalization notes  . Alcoholic ([place])   . AVM (arteriovenous malformation) of colon    colonoscopy 2011 [facility]   . Cardiomyopathy ([place])    from recent hospitalization notes  . Confusion   . Dementia   . Gangrene of digit    left toe  . Leukocytosis   . PAD (peripheral artery disease) ([place])   . Pulmonary hypertension ([place])   . Seizures ([place])     1 time seizure on 12/28/14 - seen in ED  .  Tobacco dependence    Past Surgical History:  Procedure Laterality Date  . AMPUTATION Left 01/01/2015   Procedure: Left Transmetatarsal Amputation;  Surgeon: Newt Minion, MD;  Location: [facility];  Service: Orthopedics;  Laterality: Left;  . LOWER EXTREMITY ANGIOGRAM Bilateral 12/14/2014   Procedure: Lower Extremity Angiogram;  Surgeon: Elam Dutch, MD;  Location: [facility];  Service: Cardiovascular;  Laterality: Bilateral;  . PERIPHERAL VASCULAR CATHETERIZATION N/A 12/14/2014   Procedure: Abdominal Aortogram;  Surgeon: Elam Dutch, MD;  Location: [facility];  Service: Cardiovascular;  Laterality: N/A;  . PERIPHERAL VASCULAR CATHETERIZATION Left 12/14/2014   Procedure: Peripheral Vascular Intervention;  Surgeon: Elam Dutch, MD;  Location: [facility];  Service: Cardiovascular;  Laterality: Left;  SFA   Family History  Problem Relation Age of Onset  . Stroke Sister   . Deep vein thrombosis Sister   . Cancer Brother   . Diabetes Brother   . Hyperlipidemia Brother   . Hypertension Brother   . Heart disease Father   . Hypertension Sister    Social History   Occupational History  . Not on file.   Social History Main Topics  . Smoking status: Current Every Day Smoker    Packs/day: 0.25    Years: 55.00    Types: Cigarettes  . Smokeless tobacco: Never  Used  . Alcohol use No     Comment: Pt has not drank while in SNF  . Drug use: No  . Sexual activity: Not Currently   Tobacco Counseling Ready to quit: Not Answered Counseling given: Not Answered   Activities of Daily Living In your present state of health, do you have any difficulty performing the following activities: 11/09/2016  Hearing? N  Vision? N  Difficulty concentrating or making decisions? Y  Walking or climbing stairs? Y  Dressing or bathing? Y  Doing errands, shopping? Y  Preparing Food and eating ? Y  Using the Toilet? Y  In the past six months, have you accidently leaked  urine? Y  Do you have problems with loss of bowel control? Y  Managing your Medications? Y  Managing your Finances? Y  Housekeeping or managing your Housekeeping? Y  Some recent data might be hidden    Immunizations and Health Maintenance Immunization History  Administered Date(s) Administered  . Influenza-Unspecified 01/27/2015, 01/20/2016  . PPD Test 12/15/2014  . Pneumococcal Polysaccharide-23 01/20/2016  . Tdap 03/14/2014   There are no preventive care reminders to display for this patient.  Patient Care Team: Hendricks Limes, MD as PCP - General (Internal Medicine)  Indicate any recent Medical Services you may have received from other than [facility] providers in the past year (date may be approximate).    Assessment:   This is a routine wellness examination for [place].   Hearing/Vision screen No exam data present  Dietary issues and exercise activities discussed: Current Exercise Habits: The patient does not participate in regular exercise at present, Exercise limited by: neurologic condition(s)  Goals    None     Depression Screen PHQ 2/9 Scores 11/09/2016  PHQ - 2 Score 0    Fall Risk Fall Risk  11/09/2016 06/01/2016 02/18/2016 12/17/2015 11/17/2015  Falls in the past year? No No No No No    Cognitive Function:     6CIT Screen 11/09/2016  What Year? 4 points  What month? 3 points  What time? 0 points  Count back from 20 0 points  Months in reverse 4 points  Repeat phrase 6 points  Total Score 17    Screening Tests Health Maintenance  Topic Date Due  . INFLUENZA VACCINE  11/15/2016  . PNA vac Low Risk Adult (2 of 2 - PCV13) 01/19/2017  . TETANUS/TDAP  03/14/2024  . COLONOSCOPY  04/21/2026        Plan:    I have personally reviewed and addressed the Medicare Annual Wellness questionnaire and have noted the following in the patient's chart:  A. Medical and social history B. Use of alcohol, tobacco or illicit drugs  C. Current medications and  supplements D. Functional ability and status E.  Nutritional status F.  Physical activity G. Advance directives H. List of other physicians I.  Hospitalizations, surgeries, and ER visits in previous 12 months J.  [place] to include hearing, vision, cognitive, depression L. Referrals and appointments - none  In addition, I have reviewed and discussed with patient certain preventive protocols, quality metrics, and best practice recommendations. A written personalized care plan for preventive services as well as general preventive health recommendations were provided to patient.  See attached scanned questionnaire for additional information.   Signed,   Rich Reining, RN Nurse Health Advisor   Quick Notes   Health Maintenance: PNA 13 due, ordered     Abnormal Screen: 6 CIT-17     Patient  Concerns: None     Nurse Concerns: None  I have personally reviewed the health advisor's clinical note, was available for consultation, and agree with the assessment and plan as written. Hendricks Limes M.D., FACP, [facility]

## 2017-02-08 ENCOUNTER — Ambulatory Visit (HOSPITAL_COMMUNITY)
Admission: RE | Admit: 2017-02-08 | Discharge: 2017-02-08 | Disposition: A | Discharge status: Home / Self Care | Payer: Medicare Other | Source: Ambulatory Visit | Attending: Vascular Surgery | Admitting: Vascular Surgery

## 2017-02-08 ENCOUNTER — Ambulatory Visit (INDEPENDENT_AMBULATORY_CARE_PROVIDER_SITE_OTHER): Payer: Medicare Other | Admitting: Family

## 2017-02-08 ENCOUNTER — Encounter: Payer: Self-pay | Admitting: Family

## 2017-02-08 ENCOUNTER — Ambulatory Visit (INDEPENDENT_AMBULATORY_CARE_PROVIDER_SITE_OTHER)
Admission: RE | Admit: 2017-02-08 | Discharge: 2017-02-08 | Disposition: A | Discharge status: Home / Self Care | Payer: Medicare Other | Source: Ambulatory Visit | Attending: Vascular Surgery | Admitting: Vascular Surgery

## 2017-02-08 VITALS — BP 111/72 | HR 56 | Temp 97.7°F | Resp 16 | Ht 72.0 in | Wt 134.0 lb

## 2017-02-08 DIAGNOSIS — I739 Peripheral vascular disease, unspecified: Secondary | ICD-10-CM

## 2017-02-08 DIAGNOSIS — Z89422 Acquired absence of other left toe(s): Secondary | ICD-10-CM | POA: Diagnosis not present

## 2017-02-08 DIAGNOSIS — I779 Disorder of arteries and arterioles, unspecified: Secondary | ICD-10-CM

## 2017-02-08 DIAGNOSIS — F17200 Nicotine dependence, unspecified, uncomplicated: Secondary | ICD-10-CM | POA: Diagnosis not present

## 2017-02-08 DIAGNOSIS — I10 Essential (primary) hypertension: Secondary | ICD-10-CM | POA: Insufficient documentation

## 2017-02-08 NOTE — Patient Instructions (Signed)
Steps to Quit Smoking Smoking tobacco can be bad for your health. It can also affect almost every organ in your body. Smoking puts you and people around you at risk for many serious long-lasting (chronic) diseases. Quitting smoking is hard, but it is one of the best things that you can do for your health. It is never too late to quit. What are the benefits of quitting smoking? When you quit smoking, you lower your risk for getting serious diseases and conditions. They can include:  Lung cancer or lung disease.  Heart disease.  Stroke.  Heart attack.  Not being able to have children (infertility).  Weak bones (osteoporosis) and broken bones (fractures).  If you have coughing, wheezing, and shortness of breath, those symptoms may get better when you quit. You may also get sick less often. If you are pregnant, quitting smoking can help to lower your chances of having a baby of low birth weight. What can I do to help me quit smoking? Talk with your doctor about what can help you quit smoking. Some things you can do (strategies) include:  Quitting smoking totally, instead of slowly cutting back how much you smoke over a period of time.  Going to in-person counseling. You are more likely to quit if you go to many counseling sessions.  Using resources and support systems, such as: ? Online chats with a counselor. ? Phone quitlines. ? Printed self-help materials. ? Support groups or group counseling. ? Text messaging programs. ? Mobile phone apps or applications.  Taking medicines. Some of these medicines may have nicotine in them. If you are pregnant or breastfeeding, do not take any medicines to quit smoking unless your doctor says it is okay. Talk with your doctor about counseling or other things that can help you.  Talk with your doctor about using more than one strategy at the same time, such as taking medicines while you are also going to in-person counseling. This can help make  quitting easier. What things can I do to make it easier to quit? Quitting smoking might feel very hard at first, but there is a lot that you can do to make it easier. Take these steps:  Talk to your family and friends. Ask them to support and encourage you.  Call phone quitlines, reach out to support groups, or work with a counselor.  Ask people who smoke to not smoke around you.  Avoid places that make you want (trigger) to smoke, such as: ? Bars. ? Parties. ? Smoke-break areas at work.  Spend time with people who do not smoke.  Lower the stress in your life. Stress can make you want to smoke. Try these things to help your stress: ? Getting regular exercise. ? Deep-breathing exercises. ? Yoga. ? Meditating. ? Doing a body scan. To do this, close your eyes, focus on one area of your body at a time from head to toe, and notice which parts of your body are tense. Try to relax the muscles in those areas.  Download or buy apps on your mobile phone or tablet that can help you stick to your quit plan. There are many free apps, such as QuitGuide from the CDC (Centers for Disease Control and Prevention). You can find more support from smokefree.gov and other websites.  This information is not intended to replace advice given to you by your health care provider. Make sure you discuss any questions you have with your health care provider. Document Released: 01/28/2009 Document   Revised: 11/30/2015 Document Reviewed: 08/18/2014 Elsevier Interactive Patient Education  2018 [employer] Inc[employer]     Peripheral Vascular Disease Peripheral vascular disease (PVD) is a disease of the blood vessels that are not part of your heart and brain. A simple term for PVD is poor circulation. In most cases, PVD narrows the blood vessels that carry blood from your heart to the rest of your body. This can result in a decreased supply of blood to your arms, legs, and internal organs, like your stomach or kidneys.  However, it most often affects a person's lower legs and feet. There are two types of PVD.  Organic PVD. This is the more common type. It is caused by damage to the structure of blood vessels.  Functional PVD. This is caused by conditions that make blood vessels contract and tighten (spasm).  Without treatment, PVD tends to get worse over time. PVD can also lead to acute ischemic limb. This is when an arm or limb suddenly has trouble getting enough blood. This is a medical emergency. Follow these instructions at home:  Take medicines only as told by your doctor.  Do not use any tobacco products, including cigarettes, chewing tobacco, or electronic cigarettes. If you need help quitting, ask your doctor.  Lose weight if you are overweight, and maintain a healthy weight as told by your doctor.  Eat a diet that is low in fat and cholesterol. If you need help, ask your doctor.  Exercise regularly. Ask your doctor for some good activities for you.  Take good care of your feet. ? Wear comfortable shoes that fit well. ? Check your feet often for any cuts or sores. Contact a doctor if:  You have cramps in your legs while walking.  You have leg pain when you are at rest.  You have coldness in a leg or foot.  Your skin changes.  You are unable to get or have an erection (erectile dysfunction).  You have cuts or sores on your feet that are not healing. Get help right away if:  Your arm or leg turns cold and blue.  Your arms or legs become red, warm, swollen, painful, or numb.  You have chest pain or trouble breathing.  You suddenly have weakness in your face, arm, or leg.  You become very confused or you cannot speak.  You suddenly have a very bad headache.  You suddenly cannot see. This information is not intended to replace advice given to you by your health care provider. Make sure you discuss any questions you have with your health care provider. Document Released:  06/28/2009 Document Revised: 09/09/2015 Document Reviewed: 09/11/2013 Elsevier Interactive Patient Education  2017 [employer] Inc.  

## 2017-02-08 NOTE — Progress Notes (Signed)
[facility] [place]   CC: Follow up peripheral artery occlusive disease  History of Present Illness Leon Foster is a 76 y.o. male who is s/p left superficial femoral artery stenting procedure on 12/14/2014 by Dr. Oneida Alar.  He subsequently underwent a transmetatarsal amputation of the left foot on 01/01/15 by Dr. Sharol Given. This is completely healed. The patient is currently on Plavix.   He indicates that he walks twice around his building 5 days/week. He denies claudication sx's with walking. He denies non healing wounds in his feet or legs.   Dr. Oneida Alar last examined pt on 02-03-16. At that time there was 2+ femoral pulses bilaterally, absent popliteal and pedal pulses, healed transmetatarsal amputation  Patient had bilateral ABIs performed that day which were 0.81 on the left 0.80 on the right. duplex ultrasound of the patient's left SFA stents there was widely patent. He had one-vessel runoff via the peroneal artery.  Patient was to follow-up with me in 1 year for repeat duplex exam and ABIs.   He denies any history of stroke or TIA. He states he sees a podiatrist to trim the toenails of his right foot.   Pt Diabetic: No Pt smoker: smoker  (5 cigarettes/day, started smoking about age 42 yrs)  Pt meds include: Statin :No Betablocker: Yes ASA: No Other anticoagulants/antiplatelets: Plavix   Past Medical History:  Diagnosis Date  . Acute renal failure ([place]) 11/2014   from recent hospitalization notes  . Alcoholic ([place])   . AVM (arteriovenous malformation) of colon    colonoscopy 2011 [facility]   . Cardiomyopathy ([place])    from recent hospitalization notes  . Confusion   . Dementia   . Gangrene of digit    left toe  . Leukocytosis   . PAD (peripheral artery disease) ([place])   . Pulmonary hypertension ([place])   . Seizures ([place])     1 time seizure on 12/28/14 - seen in ED  . Tobacco dependence     Social History Social History  Substance Use Topics  .  Smoking status: Current Some Day Smoker    Years: 55.00    Types: Cigarettes  . Smokeless tobacco: Never Used  . Alcohol use No     Comment: Pt has not drank while in SNF    Family History Family History  Problem Relation Age of Onset  . Stroke Sister   . Deep vein thrombosis Sister   . Cancer Brother   . Diabetes Brother   . Hyperlipidemia Brother   . Hypertension Brother   . Heart disease Father   . Hypertension Sister     Past Surgical History:  Procedure Laterality Date  . AMPUTATION Left 01/01/2015   Procedure: Left Transmetatarsal Amputation;  Surgeon: Newt Minion, MD;  Location: [facility];  Service: Orthopedics;  Laterality: Left;  . LOWER EXTREMITY ANGIOGRAM Bilateral 12/14/2014   Procedure: Lower Extremity Angiogram;  Surgeon: Elam Dutch, MD;  Location: [facility];  Service: Cardiovascular;  Laterality: Bilateral;  . PERIPHERAL VASCULAR CATHETERIZATION N/A 12/14/2014   Procedure: Abdominal Aortogram;  Surgeon: Elam Dutch, MD;  Location: [facility];  Service: Cardiovascular;  Laterality: N/A;  . PERIPHERAL VASCULAR CATHETERIZATION Left 12/14/2014   Procedure: Peripheral Vascular Intervention;  Surgeon: Elam Dutch, MD;  Location: [facility];  Service: Cardiovascular;  Laterality: Left;  SFA    No Known Allergies  Current Outpatient Prescriptions  Medication Sig Dispense Refill  . acetaminophen (TYLENOL)  325 MG tablet Take 650 mg by mouth every 6 (six) hours as needed for headache.    . bisacodyl (DULCOLAX) 10 MG suppository Place 10 mg rectally daily as needed for mild constipation or moderate constipation (if not relieved by MOM). Reported on 07/15/2015    . clopidogrel (PLAVIX) 75 MG tablet Take 1 tablet (75 mg total) by mouth daily with breakfast.    . donepezil (ARICEPT) 10 MG tablet Take 10 mg by mouth at bedtime.    . magnesium hydroxide (MILK OF MAGNESIA) 400 MG/5ML suspension Take 30 mLs by mouth daily as needed for mild  constipation.    . metoprolol tartrate (LOPRESSOR) 25 MG tablet Take 0.5 tablets (12.5 mg total) by mouth 2 (two) times daily.    . Multiple Vitamin (MULTIVITAMIN WITH MINERALS) TABS tablet Take 1 tablet by mouth daily.    . NON FORMULARY Med Pass 175ml by mouth two times daily     No current facility-administered medications for this visit.     ROS: See HPI for pertinent positives and negatives.   Physical Examination  Vitals:   02/08/17 1530 02/08/17 1531  BP: 110/75 111/72  Pulse: (!) 56   Resp: 16   Temp: 97.7 F (36.5 C)   TempSrc: Oral   SpO2: 98%   Weight: 134 lb (60.8 kg)   Height: 6' (1.829 m)    Body mass index is 18.17 kg/m.  General: A&O x 3, WDWN, thin male. Gait: normal Eyes: PERRLA. HENT: Few remaining teeth in advanced state of decay.  Pulmonary: Respirations are non labored, CTAB, fair air movement Cardiac: regular Rhythm, no detected murmur.         Carotid Bruits Right Left   Negative Negative   Radial pulses are 2+ palpable bilaterally   Adominal aortic pulse is not palpable                         VASCULAR EXAM: Extremities without ischemic changes, without Gangrene; without open wounds. Well healed transmetatarsal amputation of the left foot.                                                                                                           LE Pulses Right Left       FEMORAL  2+ palpable  2+ palpable        POPLITEAL  1+ palpable   2+ palpable       POSTERIOR TIBIAL  not palpable   not palpable        DORSALIS PEDIS      ANTERIOR TIBIAL 1+ palpable  not palpable    Abdomen: soft, NT, no palpable masses. Skin: no rashes, no ulcers noted. Musculoskeletal: no muscle wasting or atrophy. See Extremities.   Neurologic: A&O X 3; Appropriate Affect ; SENSATION: normal; MOTOR FUNCTION:  moving all extremities equally, motor strength 5/5 throughout. Speech is fluent/normal. CN 2-12 intact.    ASSESSMENT: Leon Foster is a 76 y.o.  male who is s/p left superficial femoral artery  stenting procedure on 12/14/2014 by Dr. Oneida Alar.  He subsequently underwent a transmetatarsal amputation of the left foot on 01/01/15 by Dr. Sharol Given. This is completely healed. He has no claudication sx's with walking, no signs of ischemia in his feet or legs.   DATA  Left LE extremity arterial Duplex (02/08/17): Non hemodynamically significant partially occlusive plaque noted in the left distal SFA proximal to the stent (80 cm/s). Highest velocity is at mid stent at 161 cm/s.   Velocities in the stent have increased slightly since the exam on 02-03-16.   ABI (Date: 02/08/2017):  R:   ABI: 0.88 (was 0.80 on 02-03-16),   PT: mono  DP: mono  TBI:  0.45  L:   ABI: 0.81 (was 0.81),   PT: mono  DP: mono  TBI: s/p transmetatarsal amputation Stable bilateral ABI with mild disease, all monophasic waveforms.    PLAN:  The patient was counseled re smoking cessation and given several free resources re smoking cessation. Graduated walking program discussed and how to achieve.   Based on the patient's vascular studies and examination, pt will return to clinic in 1 year with ABI's. I advised him to notify us if he develops concerns re the circulation in his feet or legs.   I discussed in depth with the patient the nature of atherosclerosis, and emphasized the importance of maximal medical management including strict control of blood pressure, blood glucose, and lipid levels, obtaining regular exercise, and cessation of smoking.  The patient is aware that without maximal medical management the underlying atherosclerotic disease process will progress, limiting the benefit of any interventions.  The patient was given information about PAD including signs, symptoms, treatment, what symptoms should prompt the patient to seek immediate medical care, and risk reduction measures to take.  Clemon Chambers, RN, MSN, FNP-C [facility]  [employer] Phone: [phone]  Clinic MD: Oneida Alar  02/08/17 3:37 PM

## 2017-02-21 NOTE — Addendum Note (Signed)
Addended by: Lianne Cure A on: 02/21/2017 04:29 PM   Modules accepted: Orders

## 2017-03-31 IMAGING — CR DG FOOT COMPLETE 3+V*L*
3 series · 3 of 3 positions shown · non-contrast
Comparison: None.

CLINICAL DATA: Left second toe coldness and black tennis

EXAM:
LEFT FOOT - COMPLETE 3+ VIEW

[AP]
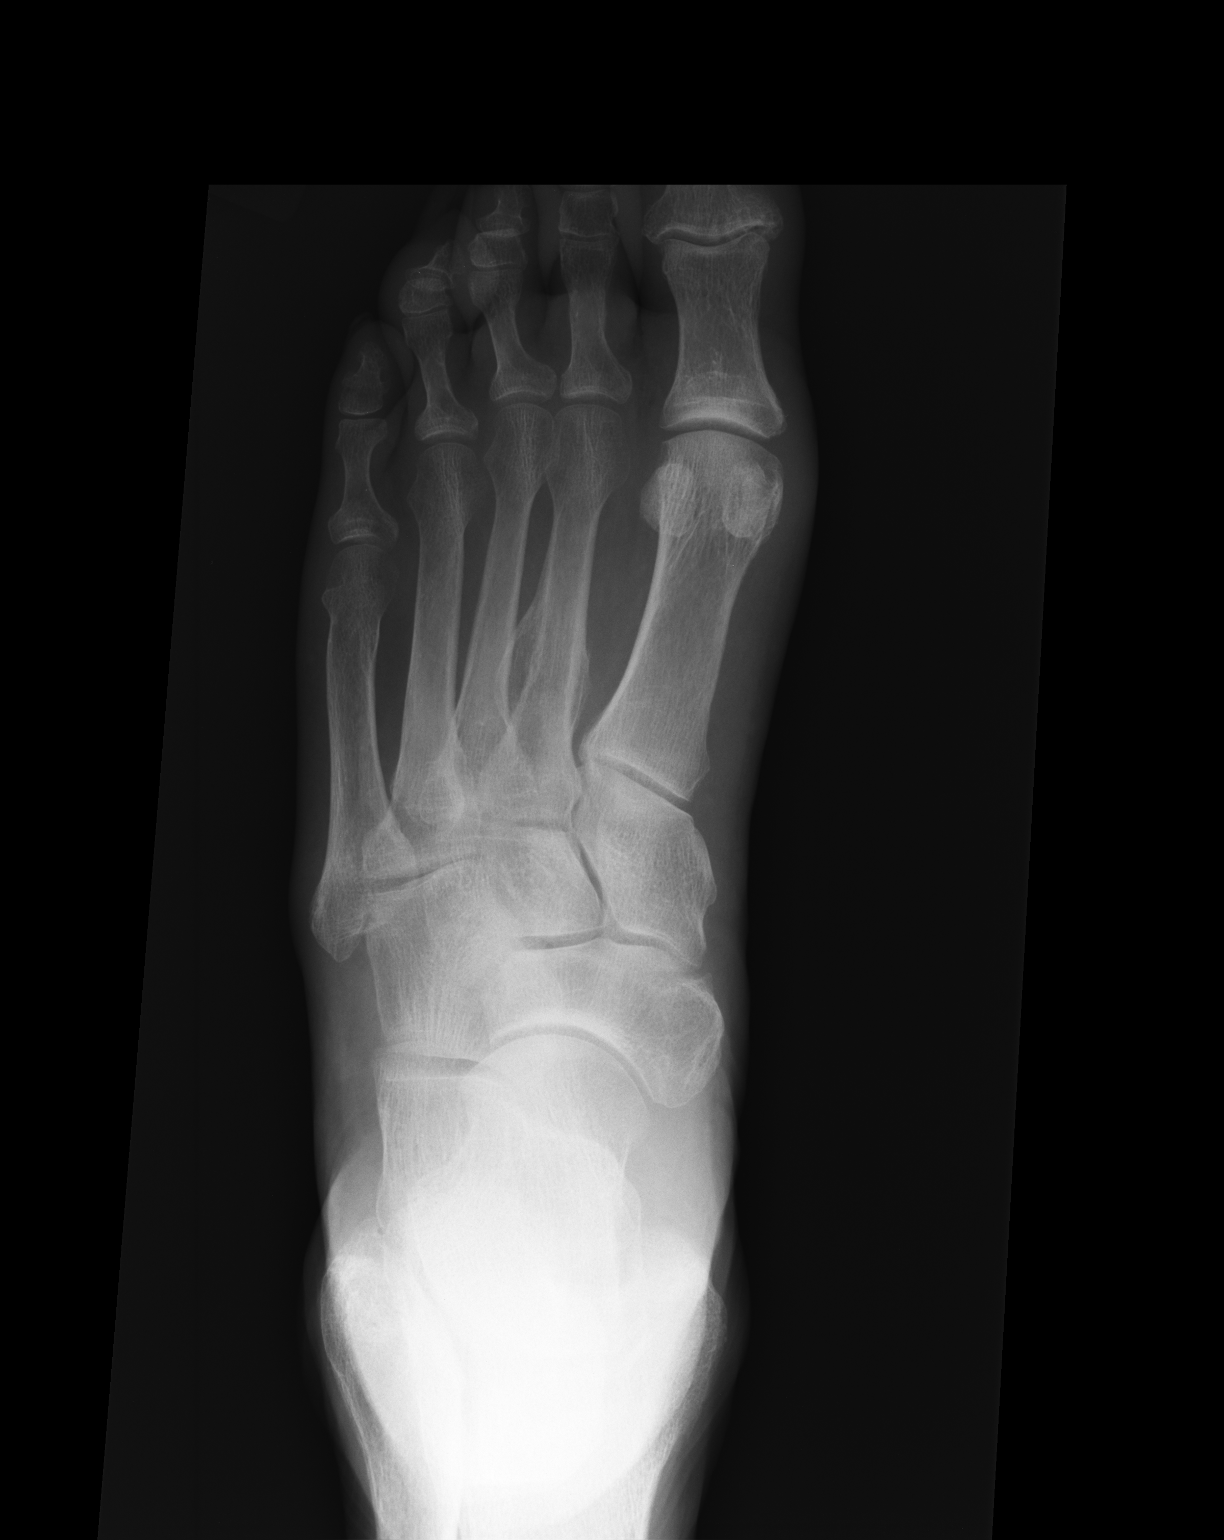

[ap obl int rot]
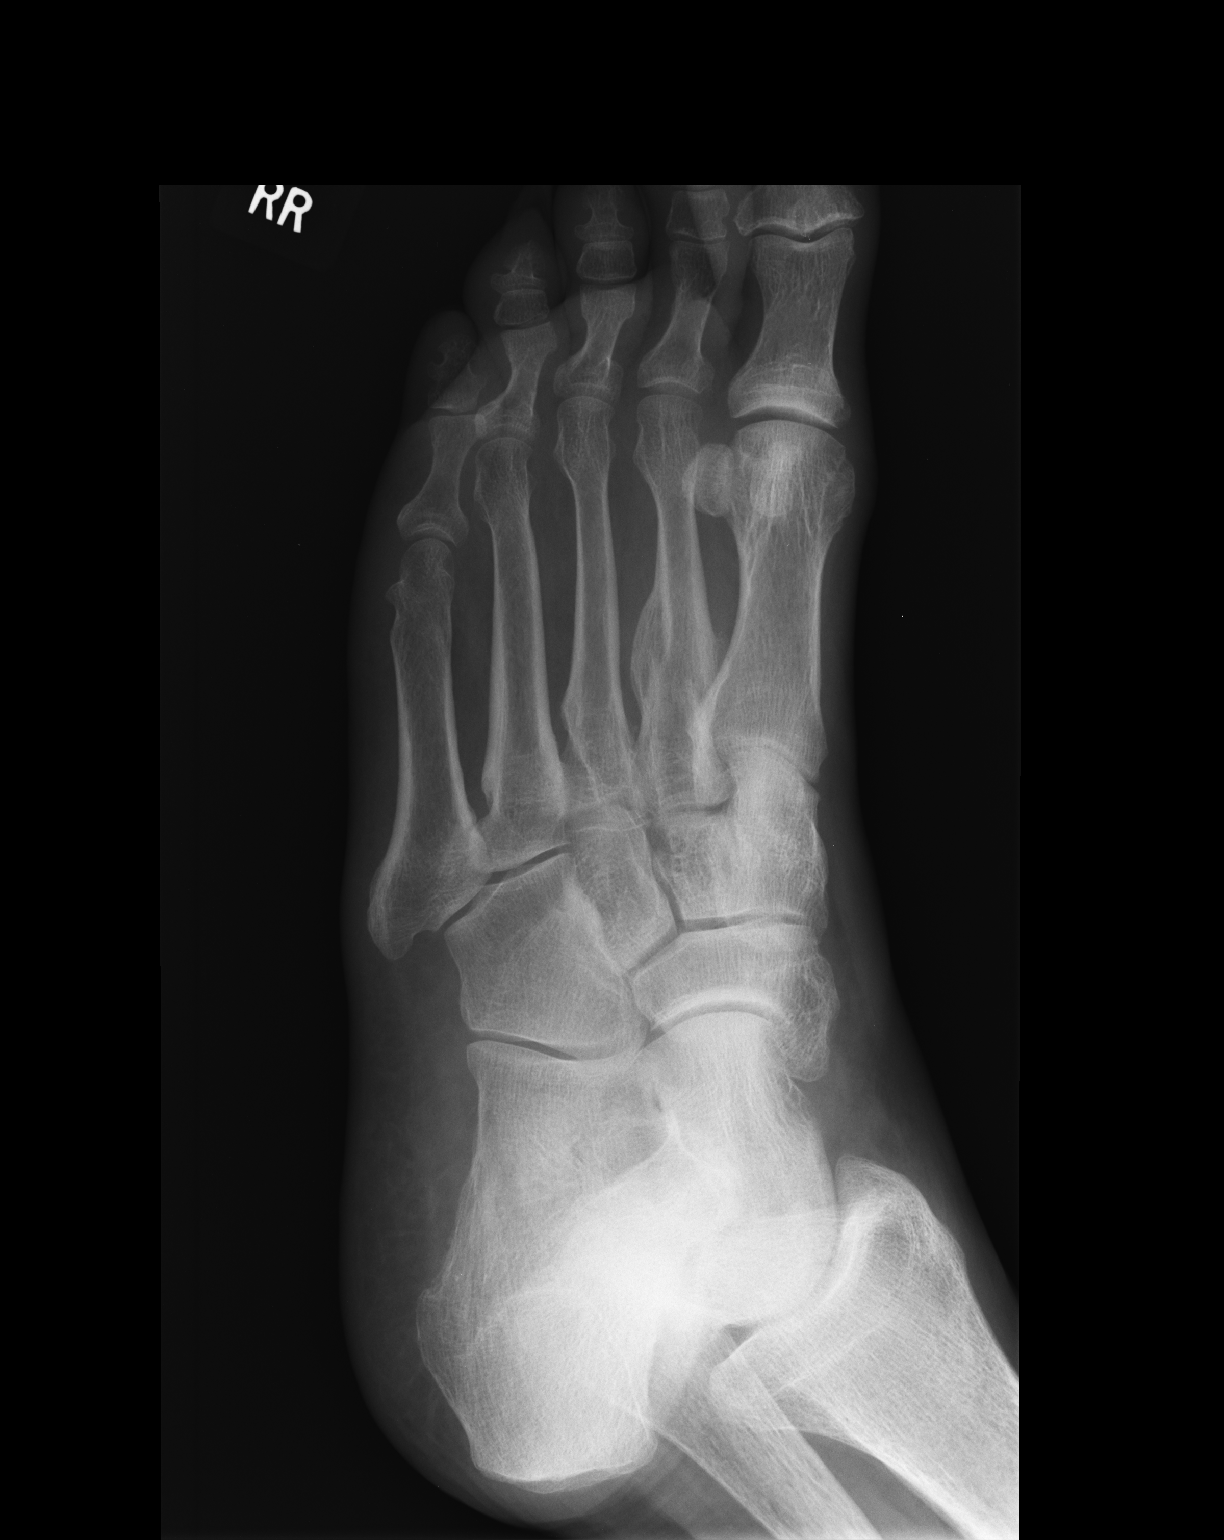

[lateral]
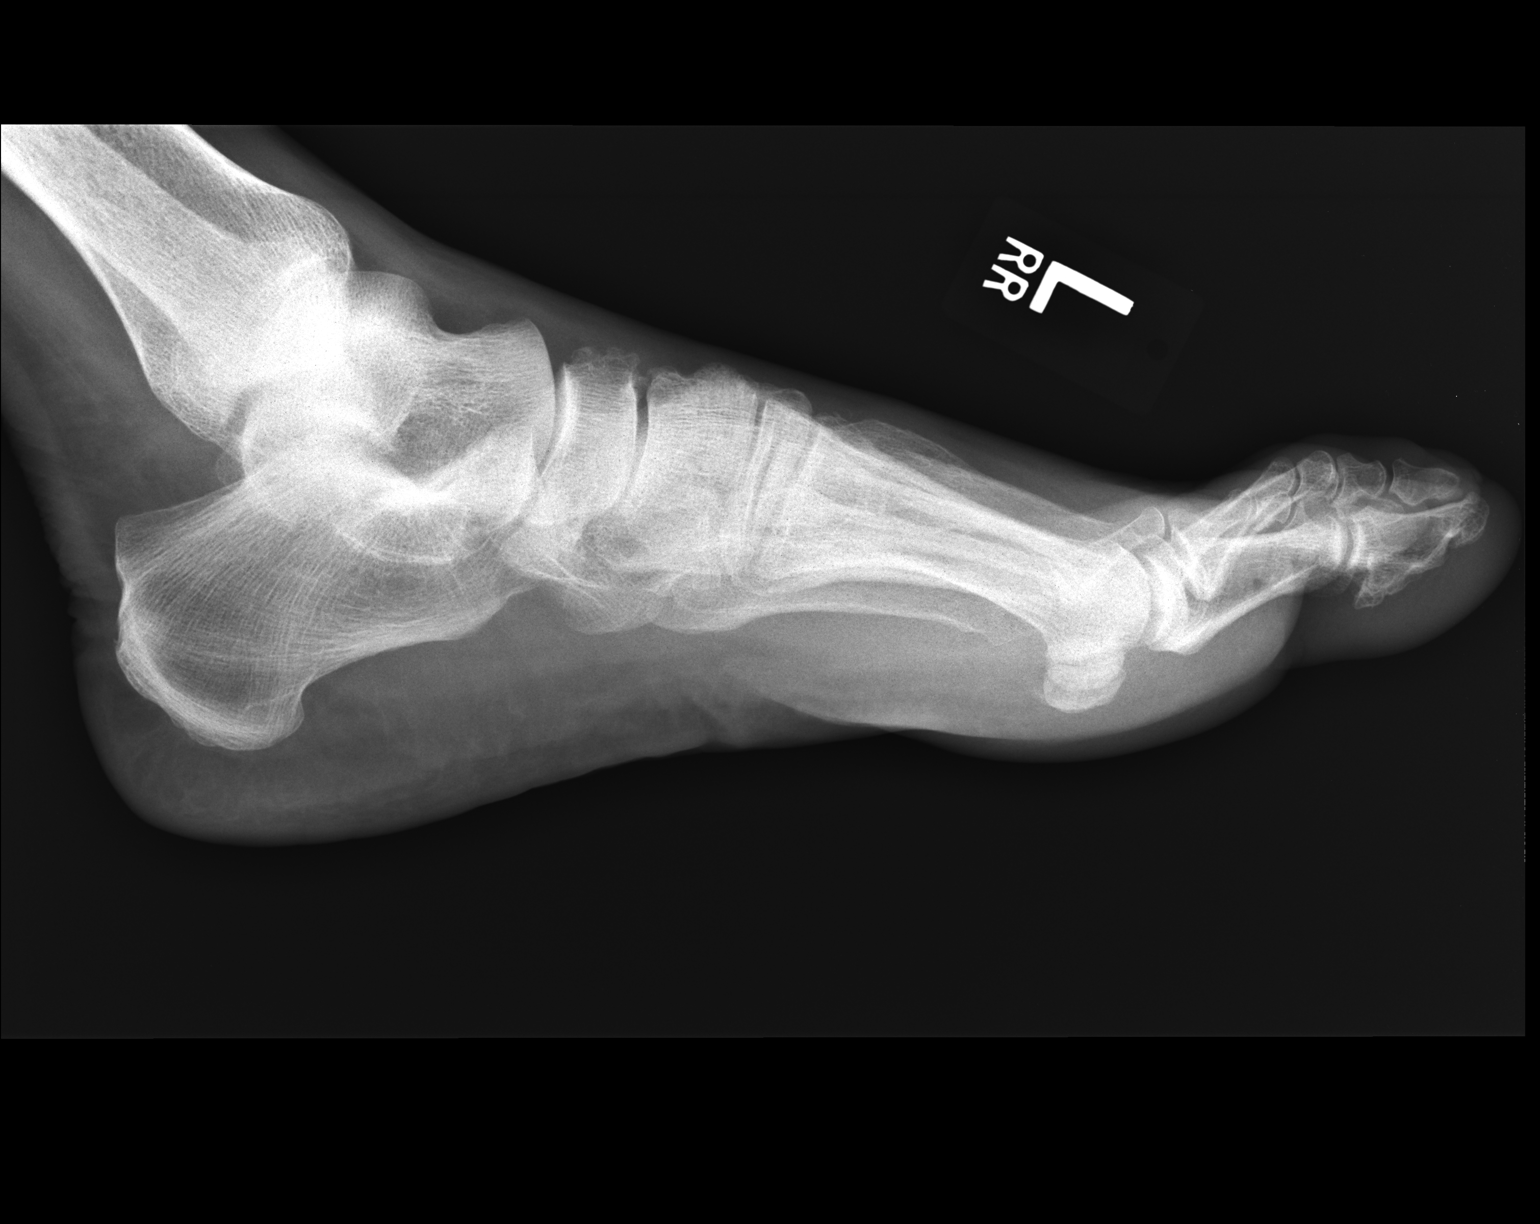

[3 of 3 positions shown; findings below may reference images not displayed]

FINDINGS: Tarsal-metatarsal alignment is normal. No evidence of osteomyelitis
is seen. No or definite erosion is seen. However, a small erosion
involving the base of the proximal phalanx of the left great toe
cannot be excluded on the oblique view. Is there any history of
gout?
IMPRESSION: No evidence of osteomyelitis. Difficult to exclude a small erosion
involving the base of the proximal phalanx of the left great toe.

## 2017-04-01 IMAGING — DX DG CHEST 2V
2 series · 2 of 2 positions shown · non-contrast
Comparison: 09/26/2004

CLINICAL DATA: Ischemic toes and elevated white blood cell count.

EXAM:
CHEST  2 VIEW

[w chest pa]
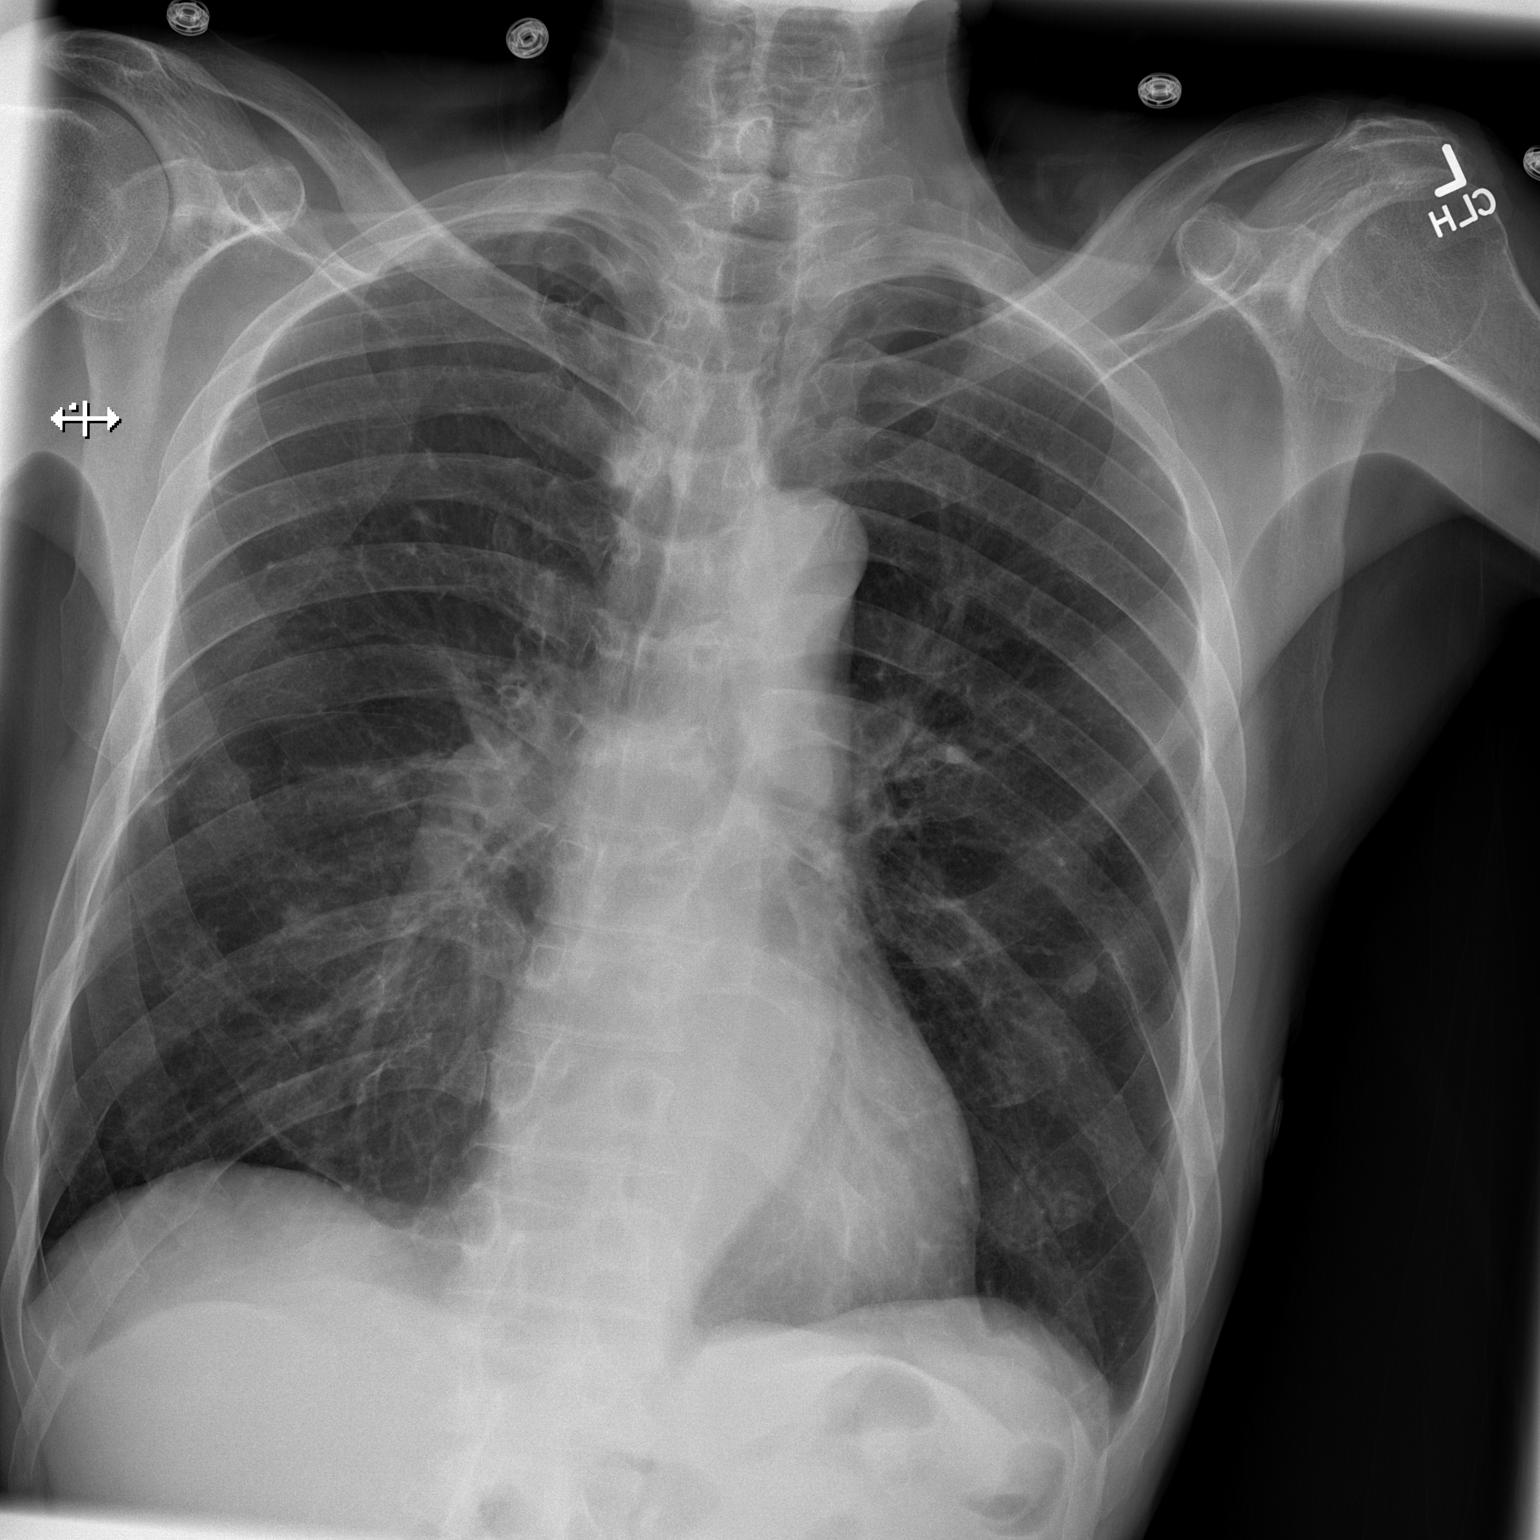

[w chest lat]
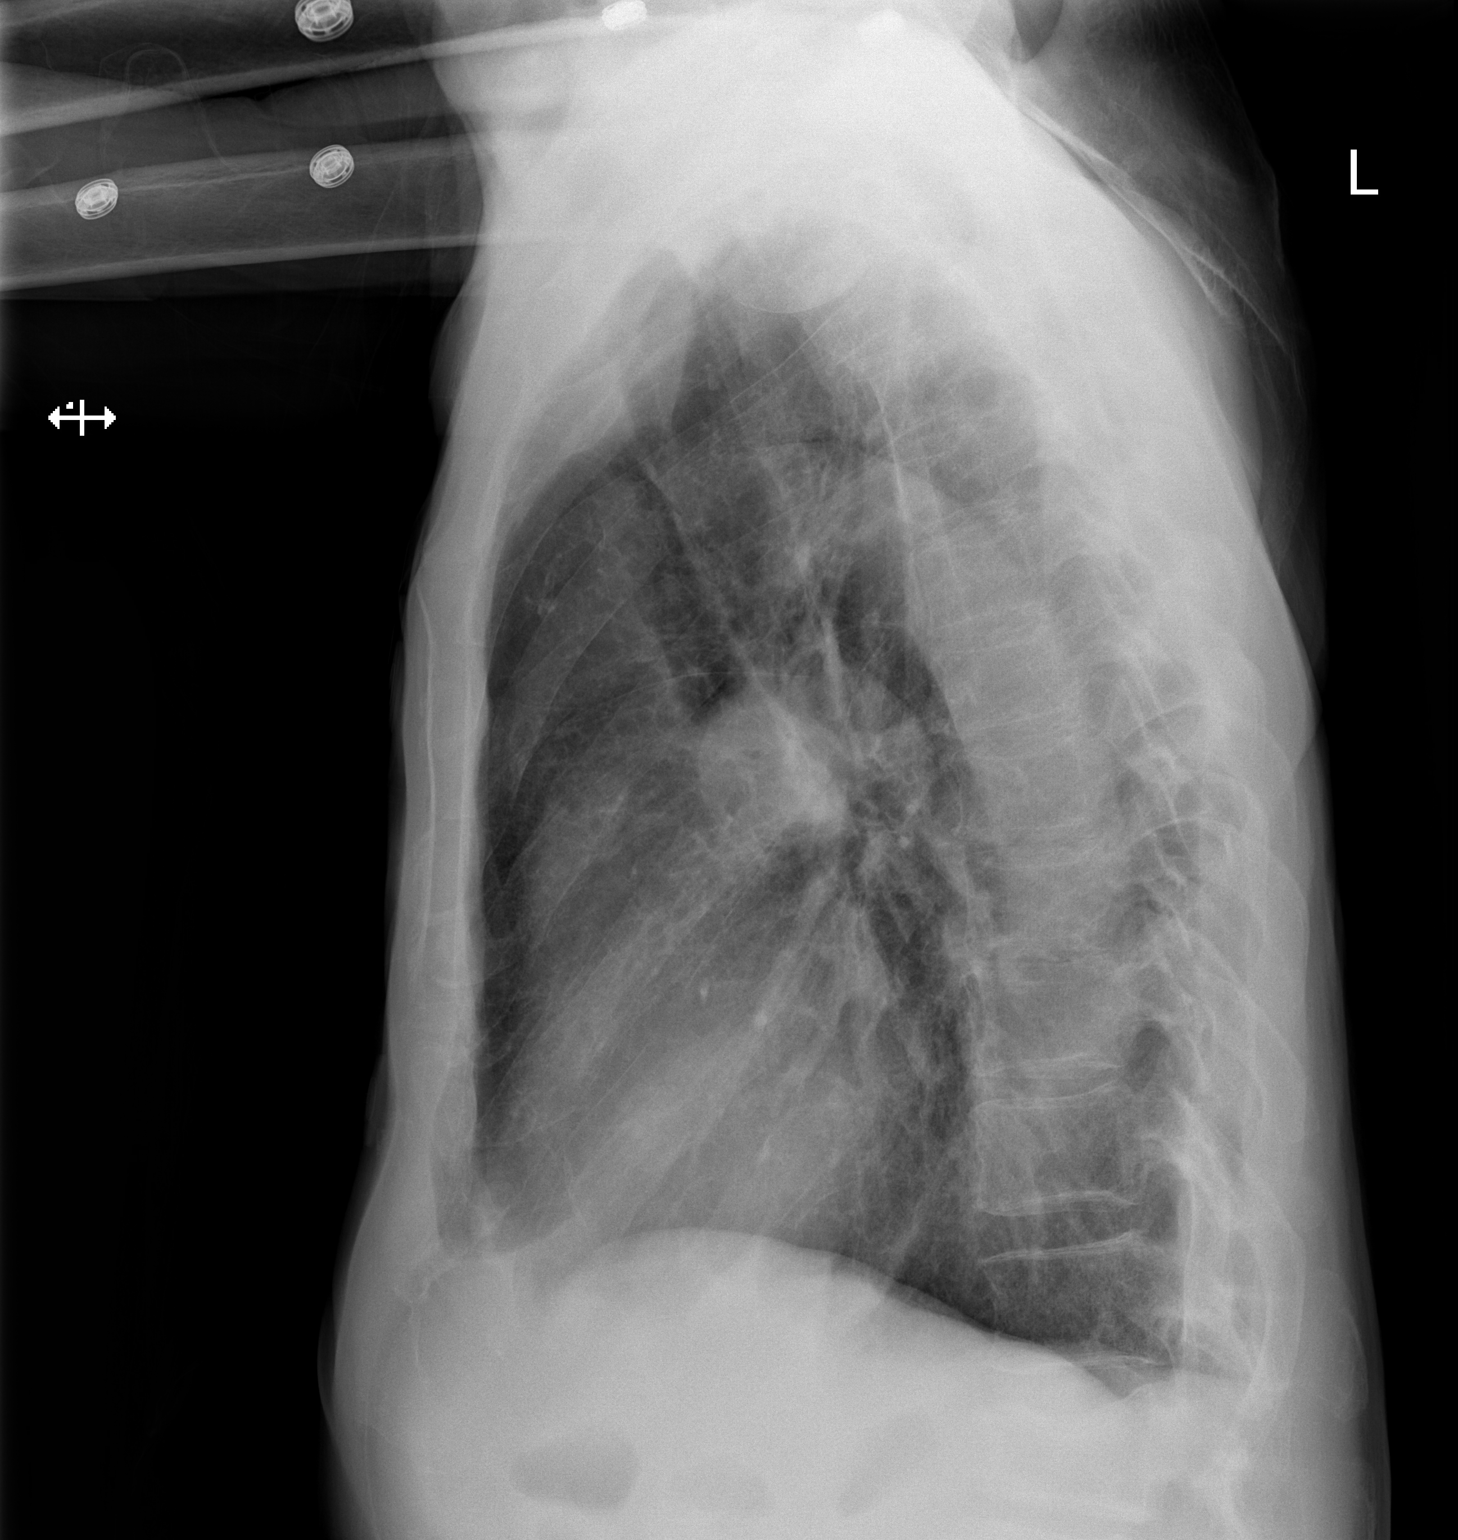

[2 of 2 positions shown; findings below may reference images not displayed]

FINDINGS: The cardiac silhouette, mediastinal and hilar contours are within
normal limits and stable. There is mild tortuosity of the thoracic
aorta. The lungs demonstrate mild stable emphysematous changes and
hyperinflation. Bilateral nipple shadows are noted. No worrisome
lesions. No acute pulmonary findings or pleural effusion. Remote
healed bilateral rib fractures are noted.
IMPRESSION: Emphysematous changes but no acute pulmonary findings.

## 2017-12-28 ENCOUNTER — Encounter (HOSPITAL_COMMUNITY): Payer: Self-pay | Admitting: *Deleted

## 2017-12-28 ENCOUNTER — Other Ambulatory Visit: Payer: Self-pay

## 2017-12-28 ENCOUNTER — Emergency Department (HOSPITAL_COMMUNITY)
Admission: EM | Admit: 2017-12-28 | Discharge: 2017-12-28 | Disposition: A | Discharge status: Home / Self Care | Payer: Medicare Other | Attending: Emergency Medicine | Admitting: Emergency Medicine

## 2017-12-28 ENCOUNTER — Emergency Department (HOSPITAL_COMMUNITY): Payer: Medicare Other

## 2017-12-28 DIAGNOSIS — Z79899 Other long term (current) drug therapy: Secondary | ICD-10-CM | POA: Diagnosis not present

## 2017-12-28 DIAGNOSIS — I10 Essential (primary) hypertension: Secondary | ICD-10-CM | POA: Insufficient documentation

## 2017-12-28 DIAGNOSIS — F17210 Nicotine dependence, cigarettes, uncomplicated: Secondary | ICD-10-CM | POA: Insufficient documentation

## 2017-12-28 DIAGNOSIS — Y929 Unspecified place or not applicable: Secondary | ICD-10-CM | POA: Insufficient documentation

## 2017-12-28 DIAGNOSIS — F0390 Unspecified dementia without behavioral disturbance: Secondary | ICD-10-CM | POA: Insufficient documentation

## 2017-12-28 DIAGNOSIS — M545 Low back pain, unspecified: Secondary | ICD-10-CM

## 2017-12-28 DIAGNOSIS — Y998 Other external cause status: Secondary | ICD-10-CM | POA: Diagnosis not present

## 2017-12-28 DIAGNOSIS — W19XXXA Unspecified fall, initial encounter: Secondary | ICD-10-CM | POA: Diagnosis not present

## 2017-12-28 DIAGNOSIS — Y9389 Activity, other specified: Secondary | ICD-10-CM | POA: Diagnosis not present

## 2017-12-28 DIAGNOSIS — Z7902 Long term (current) use of antithrombotics/antiplatelets: Secondary | ICD-10-CM | POA: Diagnosis not present

## 2017-12-28 MED ORDER — ACETAMINOPHEN 500 MG PO TABS
1000.0000 mg | ORAL_TABLET | Freq: Once | ORAL | Status: AC
  Administered 2017-12-28: 1000 mg via ORAL
  Filled 2017-12-28: qty 2

## 2017-12-28 NOTE — ED Notes (Signed)
Called ptar for pt transport  

## 2017-12-28 NOTE — ED Triage Notes (Signed)
Patient presents to ed via [facility] states he was getting ready for breakfast and fell hitting his back on the floor, c/o thoracic and lumbar pain. Moves all ext. X 4. States he isn't sure what he hit his back on.

## 2017-12-28 NOTE — ED Provider Notes (Signed)
[facility] EMERGENCY DEPARTMENT Provider Note   CSN: [id number] Arrival date & time: 12/28/17  0941     History   Chief Complaint Chief Complaint  Patient presents with  . Fall    HPI MONTRAIL MEHRER is a 77 y.o. male.  HPI   77 year old male with a history of cardiomyopathy, peripheral arterial disease, pulmonary hypertension who presents with concern for fall.  Patient reports he is getting out trying to go to breakfast, when he fell down hitting his back.  Reports back pain in his lower and middle back.  Denies numbness, weakness, chest pain, shortness of breath, headache.  Patient denies head trauma, however is on Plavix.  No neck pain.  Denies any other acute medical concerns.  Past Medical History:  Diagnosis Date  . Acute renal failure ([place]) 11/2014   from recent hospitalization notes  . Alcoholic ([place])   . AVM (arteriovenous malformation) of colon    colonoscopy 2011 [facility]   . Cardiomyopathy ([place])    from recent hospitalization notes  . Confusion   . Dementia   . Gangrene of digit    left toe  . Leukocytosis   . PAD (peripheral artery disease) ([place])   . Pulmonary hypertension ([place])   . Seizures ([place])     1 time seizure on 12/28/14 - seen in ED  . Tobacco dependence     Patient Active Problem List   Diagnosis Date Noted  . H/O onychomycosis 12/30/2015  . Dementia 10/07/2015  . Protein-calorie malnutrition ([place]) 10/07/2015  . H/O ETOH abuse 10/07/2015  . 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 HTN (hypertension) 12/21/2014  . Anemia, chronic disease 12/21/2014  . Pulmonary hypertension ([place])   . 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 Tobacco abuse 11/27/2014  . Alcohol dependence ([place]) 11/27/2014  . PAD (peripheral artery disease) ([place]) 11/27/2014  . Pain of toe of left foot-Great, and Left 2nd Toe- Right Leg/Calf 11/26/2014  . POLYP, COLON, RECURRENT 07/06/2009    Past Surgical History:    Procedure Laterality Date  . AMPUTATION Left 01/01/2015   Procedure: Left Transmetatarsal Amputation;  Surgeon: Newt Minion, MD;  Location: [facility];  Service: Orthopedics;  Laterality: Left;  . LOWER EXTREMITY ANGIOGRAM Bilateral 12/14/2014   Procedure: Lower Extremity Angiogram;  Surgeon: Elam Dutch, MD;  Location: [facility];  Service: Cardiovascular;  Laterality: Bilateral;  . PERIPHERAL VASCULAR CATHETERIZATION N/A 12/14/2014   Procedure: Abdominal Aortogram;  Surgeon: Elam Dutch, MD;  Location: [facility];  Service: Cardiovascular;  Laterality: N/A;  . PERIPHERAL VASCULAR CATHETERIZATION Left 12/14/2014   Procedure: Peripheral Vascular Intervention;  Surgeon: Elam Dutch, MD;  Location: [facility];  Service: Cardiovascular;  Laterality: Left;  SFA        Home Medications    Prior to Admission medications   Medication Sig Start Date End Date Taking? Authorizing Provider  acetaminophen (TYLENOL) 325 MG tablet Take 650 mg by mouth every 6 (six) hours as needed for headache.    [provider]  bisacodyl (DULCOLAX) 10 MG suppository Place 10 mg rectally daily as needed for mild constipation or moderate constipation (if not relieved by MOM). Reported on 07/15/2015    [provider]  clopidogrel (PLAVIX) 75 MG tablet Take 1 tablet (75 mg total) by mouth daily with breakfast. 12/15/14   Bonnielee Haff, MD  donepezil (ARICEPT) 10 MG tablet Take 10 mg by mouth  at bedtime.    [provider]  magnesium hydroxide (MILK OF MAGNESIA) 400 MG/5ML suspension Take 30 mLs by mouth daily as needed for mild constipation.    [provider]  metoprolol tartrate (LOPRESSOR) 25 MG tablet Take 0.5 tablets (12.5 mg total) by mouth 2 (two) times daily. 12/15/14   Bonnielee Haff, MD  Multiple Vitamin (MULTIVITAMIN WITH MINERALS) TABS tablet Take 1 tablet by mouth daily. 12/15/14   Bonnielee Haff, MD  NON FORMULARY Med Pass 128ml by mouth  two times daily    [provider]    Family History Family History  Problem Relation Age of Onset  . Stroke Sister   . Deep vein thrombosis Sister   . Cancer Brother   . Diabetes Brother   . Hyperlipidemia Brother   . Hypertension Brother   . Heart disease Father   . Hypertension Sister     Social History Social History   Tobacco Use  . Smoking status: Current Some Day Smoker    Years: 55.00    Types: Cigarettes  . Smokeless tobacco: Never Used  Substance Use Topics  . Alcohol use: No    Alcohol/week: 0.0 standard drinks    Comment: Pt has not drank while in SNF  . Drug use: No     Allergies   Patient has no known allergies.   Review of Systems Review of Systems  Constitutional: Negative for fever.  HENT: Negative for sore throat.   Eyes: Negative for visual disturbance.  Respiratory: Negative for shortness of breath.   Cardiovascular: Negative for chest pain.  Gastrointestinal: Negative for abdominal pain.  Genitourinary: Negative for difficulty urinating.  Musculoskeletal: Positive for back pain. Negative for neck stiffness.  Skin: Negative for rash.  Neurological: Negative for syncope and headaches.     Physical Exam Updated Vital Signs BP 108/69 (BP Location: Right Arm)   Pulse 72   Temp (!) 97.3 F (36.3 C) (Oral)   Resp 18   Ht 5\' 9"  (1.753 m)   Wt 59 kg   SpO2 98%   BMI 19.20 kg/m   Physical Exam  Constitutional: He is oriented to person, place, and time. He appears well-developed and well-nourished. No distress.  HENT:  Head: Normocephalic and atraumatic.  Eyes: Conjunctivae and EOM are normal.  Neck: Normal range of motion.  Cardiovascular: Normal rate, regular rhythm, normal heart sounds and intact distal pulses. Exam reveals no gallop and no friction rub.  No murmur heard. Pulmonary/Chest: Effort normal and breath sounds normal. No respiratory distress. He has no wheezes. He has no rales.  Abdominal: Soft. He exhibits no  distension. There is no tenderness. There is no guarding.  Musculoskeletal: He exhibits no edema.       Cervical back: He exhibits no tenderness and no bony tenderness.       Thoracic back: He exhibits tenderness and bony tenderness.       Lumbar back: He exhibits tenderness and bony tenderness.  Neurological: He is alert and oriented to person, place, and time.  Skin: Skin is warm and dry. He is not diaphoretic.  Nursing note and vitals reviewed.    ED Treatments / Results  Labs (all labs ordered are listed, but only abnormal results are displayed) Labs Reviewed - No data to display  EKG EKG Interpretation  Date/Time:  Friday December 28 2017 09:51:24 EDT Ventricular Rate:  69 PR Interval:    QRS Duration: 57 QT Interval:  556 QTC Calculation: 596 R Axis:  30 Text Interpretation:  Sinus rhythm Borderline T wave abnormalities Prolonged QT interval No significant change since last tracing Confirmed by Gareth Morgan [phone]) on 12/28/2017 10:36:07 AM   Radiology No results found.  Procedures Procedures (including critical care time)  Medications Ordered in ED Medications - No data to display   Initial Impression / Assessment and Plan / ED Course  I have reviewed the triage vital signs and the nursing notes.  Pertinent labs & imaging results that were available during my care of the patient were reviewed by me and considered in my medical decision making (see chart for details).     77 year old male with a history of cardiomyopathy, peripheral arterial disease, pulmonary hypertension who presents with concern for fall.  CT head done given patient on plavix shows no sign of intracranial hemorrhage. XR of the thoracic and lumbar spine show no acute fractures, stable and age indeterminite. No sign of other injuries by history and exam.   Suspect contusion/muscular pain. Patient discharged in stable condition with understanding of reasons to return.   Final Clinical  Impressions(s) / ED Diagnoses   Final diagnoses:  Fall, initial encounter  Acute midline low back pain without sciatica    ED Discharge Orders    None       Gareth Morgan, MD 12/28/17 1220

## 2017-12-28 NOTE — ED Notes (Signed)
Transported to xray 

## 2017-12-28 NOTE — ED Notes (Signed)
[facility] called to transport patient back to [facility]

## 2018-01-02 ENCOUNTER — Emergency Department (HOSPITAL_COMMUNITY)
Admission: EM | Admit: 2018-01-02 | Discharge: 2018-01-02 | Disposition: A | Discharge status: Nursing Home (Includes ICF) | Payer: Medicare Other | Attending: Emergency Medicine | Admitting: Emergency Medicine

## 2018-01-02 ENCOUNTER — Emergency Department (HOSPITAL_COMMUNITY): Payer: Medicare Other

## 2018-01-02 DIAGNOSIS — F17210 Nicotine dependence, cigarettes, uncomplicated: Secondary | ICD-10-CM | POA: Insufficient documentation

## 2018-01-02 DIAGNOSIS — Z7902 Long term (current) use of antithrombotics/antiplatelets: Secondary | ICD-10-CM | POA: Diagnosis not present

## 2018-01-02 DIAGNOSIS — F0390 Unspecified dementia without behavioral disturbance: Secondary | ICD-10-CM | POA: Insufficient documentation

## 2018-01-02 DIAGNOSIS — I10 Essential (primary) hypertension: Secondary | ICD-10-CM | POA: Diagnosis not present

## 2018-01-02 DIAGNOSIS — R7611 Nonspecific reaction to tuberculin skin test without active tuberculosis: Secondary | ICD-10-CM | POA: Diagnosis present

## 2018-01-02 DIAGNOSIS — Z79899 Other long term (current) drug therapy: Secondary | ICD-10-CM | POA: Diagnosis not present

## 2018-01-02 NOTE — ED Triage Notes (Signed)
Pt presents from [facility]. Pt is AxO x4 and ambulatory. Pt reportedly had a positive TB skin test at the facility, but reports pt was scratching at the area. Pt reports he has a slight cough but no other symptoms. Pt denies night sweats, fever, or fatigue.

## 2018-01-02 NOTE — Discharge Instructions (Addendum)
You were evaluated today for a positive PPD test.  Your chest xray did not show any active signs of Tb. You did have a blood draw called a Quantiferon Gold test. This test is sent out and will take 48 hours for the results to return. You will be notified if the results are positive. Return to the ED with any of the following symptoms: Fever, chest pain, SOB, cough, coughing up blood, night sweats

## 2018-01-02 NOTE — ED Provider Notes (Signed)
[facility] DEPT Provider Note   CSN: [id number] Arrival date & time: 01/02/18  1451   History   Chief Complaint Chief Complaint  Patient presents with  . Possible TB    HPI LEELYN JASINSKI is a 77 y.o. 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  Patient from [facility] facility states they place a PPD test 2 days ago and when they went to check the area, its was raised.  Per facility patient had a positive test, however he has been scratching at that area since injection.  Denies fever, chills, night sweats, cough, hemoptysis, fatigue, recent travel, known TB exposure, SOB, known positive PPD in the past.  States he has been taking at this area.  He has no complaints at this time.  HPI  Past Medical History:  Diagnosis Date  . Acute renal failure ([place]) 11/2014   from recent hospitalization notes  . Alcoholic ([place])   . AVM (arteriovenous malformation) of colon    colonoscopy 2011 [facility]   . Cardiomyopathy ([place])    from recent hospitalization notes  . Confusion   . Dementia   . Gangrene of digit    left toe  . Leukocytosis   . PAD (peripheral artery disease) ([place])   . Pulmonary hypertension ([place])   . Seizures ([place])     1 time seizure on 12/28/14 - seen in ED  . Tobacco dependence     Patient Active Problem List   Diagnosis Date Noted  . H/O onychomycosis 12/30/2015  . Dementia 10/07/2015  . Protein-calorie malnutrition ([place]) 10/07/2015  . H/O ETOH abuse 10/07/2015  . 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 HTN (hypertension) 12/21/2014  . Anemia, chronic disease 12/21/2014  . Pulmonary hypertension ([place])   . 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 Tobacco abuse 11/27/2014  . Alcohol dependence ([place]) 11/27/2014  . PAD (peripheral artery disease) ([place]) 11/27/2014  . Pain of toe of left foot-Great, and Left 2nd Toe-  Right Leg/Calf 11/26/2014  . POLYP, COLON, RECURRENT 07/06/2009    Past Surgical History:  Procedure Laterality Date  . AMPUTATION Left 01/01/2015   Procedure: Left Transmetatarsal Amputation;  Surgeon: Newt Minion, MD;  Location: [facility];  Service: Orthopedics;  Laterality: Left;  . LOWER EXTREMITY ANGIOGRAM Bilateral 12/14/2014   Procedure: Lower Extremity Angiogram;  Surgeon: Elam Dutch, MD;  Location: [facility];  Service: Cardiovascular;  Laterality: Bilateral;  . PERIPHERAL VASCULAR CATHETERIZATION N/A 12/14/2014   Procedure: Abdominal Aortogram;  Surgeon: Elam Dutch, MD;  Location: [facility];  Service: Cardiovascular;  Laterality: N/A;  . PERIPHERAL VASCULAR CATHETERIZATION Left 12/14/2014   Procedure: Peripheral Vascular Intervention;  Surgeon: Elam Dutch, MD;  Location: [facility];  Service: Cardiovascular;  Laterality: Left;  SFA        Home Medications    Prior to Admission medications   Medication Sig Start Date End Date Taking? Authorizing Provider  acetaminophen (TYLENOL) 325 MG tablet Take 650 mg by mouth every 6 (six) hours as needed for headache.    [provider]  bisacodyl (DULCOLAX) 10 MG suppository Place 10 mg rectally daily as needed for mild constipation or moderate constipation (if not relieved by MOM). Reported on 07/15/2015    [provider]  clopidogrel (PLAVIX) 75 MG tablet Take 1 tablet (75 mg total) by mouth daily with breakfast. 12/15/14  Bonnielee Haff, MD  donepezil (ARICEPT) 10 MG tablet Take 10 mg by mouth at bedtime.    [provider]  magnesium hydroxide (MILK OF MAGNESIA) 400 MG/5ML suspension Take 30 mLs by mouth daily as needed for mild constipation.    [provider]  metoprolol tartrate (LOPRESSOR) 25 MG tablet Take 0.5 tablets (12.5 mg total) by mouth 2 (two) times daily. 12/15/14   Bonnielee Haff, MD  Multiple Vitamin (MULTIVITAMIN WITH MINERALS) TABS tablet Take 1  tablet by mouth daily. 12/15/14   Bonnielee Haff, MD  NON FORMULARY Med Pass 149ml by mouth two times daily    [provider]    Family History Family History  Problem Relation Age of Onset  . Stroke Sister   . Deep vein thrombosis Sister   . Cancer Brother   . Diabetes Brother   . Hyperlipidemia Brother   . Hypertension Brother   . Heart disease Father   . Hypertension Sister     Social History Social History   Tobacco Use  . Smoking status: Current Some Day Smoker    Years: 55.00    Types: Cigarettes  . Smokeless tobacco: Never Used  Substance Use Topics  . Alcohol use: No    Alcohol/week: 0.0 standard drinks    Comment: Pt has not drank while in SNF  . Drug use: No     Allergies   Patient has no known allergies.   Review of Systems Review of Systems  Constitutional: Negative.   HENT: Negative.   Respiratory: Negative.   Cardiovascular: Negative.   Gastrointestinal: Negative.   Musculoskeletal: Negative.   Skin: Negative for pallor and rash.       Wound to the right forearm.  Hematological: Negative.      Physical Exam Updated Vital Signs BP 126/69   Pulse (!) 55   Temp 97.6 F (36.4 C)   Resp 18   SpO2 98%   Physical Exam  Constitutional: He appears well-developed and well-nourished. No distress.  HENT:  Head: Atraumatic.  Eyes: Pupils are equal, round, and reactive to light.  Neck: Normal range of motion. Neck supple.  Cardiovascular: Normal rate, regular rhythm, normal heart sounds and intact distal pulses. Exam reveals no friction rub.  No murmur heard. Pulmonary/Chest: Effort normal and breath sounds normal. No stridor. No respiratory distress. He has no wheezes. He has no rales. He exhibits no tenderness.  Abdominal: Soft. Bowel sounds are normal. He exhibits no distension and no mass. There is no tenderness. There is no rebound and no guarding.  Musculoskeletal: Normal range of motion.  Neurological: He is alert.  Skin: Skin  is warm and dry. He is not diaphoretic.  1cm x1cm area with removed skin.  Minor excoriations to the right forearm around portion of skin that was removed.  Upon entering the room patient is picking at his right forearm wound.  Psychiatric: He has a normal mood and affect.  Nursing note and vitals reviewed.    ED Treatments / Results  Labs (all labs ordered are listed, but only abnormal results are displayed) Labs Reviewed  QUANTIFERON-TB GOLD PLUS    EKG None  Radiology Dg Chest Portable 1 View  Result Date: 01/02/2018 CLINICAL DATA:  Positive PPD.  History of cardiomyopathy. EXAM: PORTABLE CHEST 1 VIEW COMPARISON:  Chest radiograph November 27, 2017 FINDINGS: Cardiomediastinal silhouette is normal. Mildly calcified aortic arch. Scattered granulomas, RIGHT apical pleuroparenchymal scarring. Mild hyperinflation. No pleural effusion or focal consolidation. No pneumothorax. Old bilateral  rib fractures. IMPRESSION: Similar hyperinflation without focal consolidation. Aortic Atherosclerosis (ICD10-I70.0). Electronically Signed   By: Elon Alas M.D.   On: 01/02/2018 18:18    Procedures Procedures (including critical care time)  Medications Ordered in ED Medications - No data to display   Initial Impression / Assessment and Plan / ED Course  I have reviewed the triage vital signs and the nursing notes as well as past medical history.  Pertinent labs & imaging results that were available during my care of the patient were reviewed by me and considered in my medical decision making (see chart for details).  77 year old male who is otherwise well and lives in a retirement facility presents for evaluation of possible possible PPD test.  Per facility, they went to check his PPD test and noticed the area had been scratched and picked at.  Patient denies cough, hemoptysis, fever, night sweats. Low suspicion for active TB. Patient denies history of positive PPD test.  Will obtain QuantiFERON  and chest x-ray reevaluate.  Plain films with scattered granulomas. Will consult with ID for further workup.  1900: Consulted with Dr. Linus Salmons with ID. Does not recommend further work up of chest xray. Low suspicion for active TB at this time.  Patient is afebrile, asymptomatic will dc home.  Quantifuron is pending. Discussed with patient return precautions. Patient voiced understanding.    Final Clinical Impressions(s) / ED Diagnoses   Final diagnoses:  Positive PPD    ED Discharge Orders    None       Geovana Gebel A, PA-C 01/02/18 2253    Hayden Rasmussen, MD 01/03/18 1208

## 2018-01-02 NOTE — ED Notes (Signed)
Bed: VD47 Expected date:  Expected time:  Means of arrival:  Comments: EMS TB

## 2018-01-06 LAB — QUANTIFERON-TB GOLD PLUS (RQFGPL)
QUANTIFERON NIL VALUE: 0.06 [IU]/mL
QuantiFERON TB1 Ag Value: 0.09 IU/mL
QuantiFERON TB2 Ag Value: 1.25 IU/mL

## 2018-01-06 LAB — QUANTIFERON-TB GOLD PLUS: QuantiFERON-TB Gold Plus: POSITIVE — AB

## 2018-06-04 ENCOUNTER — Emergency Department (HOSPITAL_COMMUNITY)
Admission: EM | Admit: 2018-06-04 | Discharge: 2018-06-05 | Disposition: A | Discharge status: Home / Self Care | Payer: Medicare Other | Attending: Emergency Medicine | Admitting: Emergency Medicine

## 2018-06-04 ENCOUNTER — Encounter (HOSPITAL_COMMUNITY): Payer: Self-pay

## 2018-06-04 ENCOUNTER — Other Ambulatory Visit: Payer: Self-pay

## 2018-06-04 ENCOUNTER — Emergency Department (HOSPITAL_COMMUNITY): Payer: Medicare Other

## 2018-06-04 DIAGNOSIS — Z79899 Other long term (current) drug therapy: Secondary | ICD-10-CM | POA: Diagnosis not present

## 2018-06-04 DIAGNOSIS — R55 Syncope and collapse: Secondary | ICD-10-CM | POA: Diagnosis present

## 2018-06-04 DIAGNOSIS — Z7901 Long term (current) use of anticoagulants: Secondary | ICD-10-CM | POA: Diagnosis not present

## 2018-06-04 DIAGNOSIS — I10 Essential (primary) hypertension: Secondary | ICD-10-CM | POA: Insufficient documentation

## 2018-06-04 DIAGNOSIS — F17210 Nicotine dependence, cigarettes, uncomplicated: Secondary | ICD-10-CM | POA: Insufficient documentation

## 2018-06-04 LAB — CBC
HCT: 42.5 % (ref 39.0–52.0)
Hemoglobin: 13.8 g/dL (ref 13.0–17.0)
MCH: 29.6 pg (ref 26.0–34.0)
MCHC: 32.5 g/dL (ref 30.0–36.0)
MCV: 91 fL (ref 80.0–100.0)
PLATELETS: 270 10*3/uL (ref 150–400)
RBC: 4.67 MIL/uL (ref 4.22–5.81)
RDW: 16.6 % — AB (ref 11.5–15.5)
WBC: 9.6 10*3/uL (ref 4.0–10.5)
nRBC: 0 % (ref 0.0–0.2)

## 2018-06-04 LAB — BASIC METABOLIC PANEL
Anion gap: 11 (ref 5–15)
BUN: 7 mg/dL — AB (ref 8–23)
CO2: 24 mmol/L (ref 22–32)
CREATININE: 1 mg/dL (ref 0.61–1.24)
Calcium: 8.5 mg/dL — ABNORMAL LOW (ref 8.9–10.3)
Chloride: 103 mmol/L (ref 98–111)
GFR calc Af Amer: >60 mL/min (ref >60)
GFR calc non Af Amer: >60 mL/min (ref >60)
GLUCOSE: 100 mg/dL — AB (ref 70–99)
Potassium: 3.9 mmol/L (ref 3.5–5.1)
Sodium: 138 mmol/L (ref 135–145)

## 2018-06-04 NOTE — ED Notes (Signed)
Pt ambulated hall, steady gait and pace, states feeling fine and ready to go back to facility.

## 2018-06-04 NOTE — Discharge Instructions (Signed)
It was our pleasure to provide your ER care today - we hope that you feel better.  Follow up with your primary care doctor in the next few days.  Return to ER if worse, new symptoms, fevers, new or severe pain, chest pain, trouble breathing, fainting, other concern.

## 2018-06-04 NOTE — ED Triage Notes (Signed)
[place] [employer] with unwitnessed syncope vs seizure, staff found him on the floor after hearing thud and reportedly thought he was having a seizure, though no sure description of seizure activity. Patient unsure if he has seizure history at this time. Pt slightly confused, unsure of date. Does take donezepil per facility list provided for memory loss. Hx dementia From [facility] in [place] per [facility] report.   110 SBP 100 RA 121 cbg 82 pulse 20 LFA

## 2018-06-04 NOTE — ED Provider Notes (Signed)
[facility] EMERGENCY DEPARTMENT Provider Note   CSN: [id number] Arrival date & time: 06/04/18  1854    History   Chief Complaint Chief Complaint  Patient presents with  . Loss of Consciousness    HPI Leon Foster is a 78 y.o. male.     Patient presents via ems from convenience store after brief unresponsive episode. No definite seizure activity noted. Pt alert, oriented on arrival to ED. Pt is poor historian, history of dementia - level 5 caveat. Patient denies pain. No headache. No neck or back pain. Denies chest pain or sob. No palpitations. No abd pain or nvd. No dysuria or gu c/o. No fever or chills.   The history is provided by the patient and the EMS personnel. The history is limited by the condition of the patient.  Loss of Consciousness  Associated symptoms: no chest pain, no fever, no headaches, no palpitations, no shortness of breath, no vomiting and no weakness     Past Medical History:  Diagnosis Date  . Acute renal failure ([place]) 11/2014   from recent hospitalization notes  . Alcoholic ([place])   . AVM (arteriovenous malformation) of colon    colonoscopy 2011 [facility]   . Cardiomyopathy ([place])    from recent hospitalization notes  . Confusion   . Dementia ([place])   . Gangrene of digit    left toe  . Leukocytosis   . PAD (peripheral artery disease) ([place])   . Pulmonary hypertension ([place])   . Seizures ([place])     1 time seizure on 12/28/14 - seen in ED  . Tobacco dependence     Patient Active Problem List   Diagnosis Date Noted  . H/O onychomycosis 12/30/2015  . Dementia ([place]) 10/07/2015  . Protein-calorie malnutrition ([place]) 10/07/2015  . H/O ETOH abuse 10/07/2015  . 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 HTN (hypertension) 12/21/2014  . Anemia, chronic disease 12/21/2014  . Pulmonary hypertension ([place])   . 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 Tobacco abuse 11/27/2014  . Alcohol dependence  ([place]) 11/27/2014  . PAD (peripheral artery disease) ([place]) 11/27/2014  . Pain of toe of left foot-Great, and Left 2nd Toe- Right Leg/Calf 11/26/2014  . POLYP, COLON, RECURRENT 07/06/2009    Past Surgical History:  Procedure Laterality Date  . AMPUTATION Left 01/01/2015   Procedure: Left Transmetatarsal Amputation;  Surgeon: Newt Minion, MD;  Location: [facility];  Service: Orthopedics;  Laterality: Left;  . LOWER EXTREMITY ANGIOGRAM Bilateral 12/14/2014   Procedure: Lower Extremity Angiogram;  Surgeon: Elam Dutch, MD;  Location: [facility];  Service: Cardiovascular;  Laterality: Bilateral;  . PERIPHERAL VASCULAR CATHETERIZATION N/A 12/14/2014   Procedure: Abdominal Aortogram;  Surgeon: Elam Dutch, MD;  Location: [facility];  Service: Cardiovascular;  Laterality: N/A;  . PERIPHERAL VASCULAR CATHETERIZATION Left 12/14/2014   Procedure: Peripheral Vascular Intervention;  Surgeon: Elam Dutch, MD;  Location: [facility];  Service: Cardiovascular;  Laterality: Left;  SFA        Home Medications    Prior to Admission medications   Medication Sig Start Date End Date Taking? Authorizing Provider  acetaminophen (TYLENOL) 325 MG tablet Take 650 mg by mouth every 6 (six) hours as needed for headache.    [provider]  bisacodyl (DULCOLAX) 10 MG suppository Place 10 mg rectally daily as needed for mild constipation or moderate constipation (if not relieved by MOM). Reported on  07/15/2015    [provider]  clopidogrel (PLAVIX) 75 MG tablet Take 1 tablet (75 mg total) by mouth daily with breakfast. 12/15/14   Bonnielee Haff, MD  donepezil (ARICEPT) 10 MG tablet Take 10 mg by mouth at bedtime.    [provider]  magnesium hydroxide (MILK OF MAGNESIA) 400 MG/5ML suspension Take 30 mLs by mouth daily as needed for mild constipation.    [provider]  metoprolol tartrate (LOPRESSOR) 25 MG tablet Take 0.5 tablets (12.5 mg total) by  mouth 2 (two) times daily. 12/15/14   Bonnielee Haff, MD  Multiple Vitamin (MULTIVITAMIN WITH MINERALS) TABS tablet Take 1 tablet by mouth daily. 12/15/14   Bonnielee Haff, MD  NON FORMULARY Med Pass 168ml by mouth two times daily    [provider]    Family History Family History  Problem Relation Age of Onset  . Stroke Sister   . Deep vein thrombosis Sister   . Cancer Brother   . Diabetes Brother   . Hyperlipidemia Brother   . Hypertension Brother   . Heart disease Father   . Hypertension Sister     Social History Social History   Tobacco Use  . Smoking status: Current Some Day Smoker    Years: 55.00    Types: Cigarettes  . Smokeless tobacco: Never Used  Substance Use Topics  . Alcohol use: No    Alcohol/week: 0.0 standard drinks    Comment: Pt has not drank while in SNF  . Drug use: No     Allergies   Patient has no known allergies.   Review of Systems Review of Systems  Constitutional: Negative for fever.  HENT: Negative for sore throat.   Eyes: Negative for visual disturbance.  Respiratory: Negative for cough and shortness of breath.   Cardiovascular: Positive for syncope. Negative for chest pain, palpitations and leg swelling.  Gastrointestinal: Negative for abdominal pain, blood in stool, diarrhea and vomiting.  Endocrine: Negative for polyuria.  Genitourinary: Negative for dysuria and flank pain.  Musculoskeletal: Negative for back pain and neck pain.  Skin: Negative for rash.  Neurological: Negative for weakness, numbness and headaches.  Hematological: Does not bruise/bleed easily.  Psychiatric/Behavioral: The patient is not nervous/anxious.      Physical Exam Updated Vital Signs BP 115/80 (BP Location: Left Arm)   Pulse 61   Temp (!) 97.5 F (36.4 C) (Oral)   Resp 16   Ht 1.702 m (5\' 7" )   Wt 59 kg   SpO2 100%   BMI 20.37 kg/m   Physical Exam Vitals signs and nursing note reviewed.  Constitutional:      Appearance: Normal  appearance. He is well-developed.  HENT:     Head: Atraumatic.     Nose: Nose normal.     Mouth/Throat:     Mouth: Mucous membranes are moist.     Pharynx: Oropharynx is clear.  Eyes:     General: No scleral icterus.    Conjunctiva/sclera: Conjunctivae normal.     Pupils: Pupils are equal, round, and reactive to light.  Neck:     Musculoskeletal: Normal range of motion and neck supple. No neck rigidity or muscular tenderness.     Vascular: No carotid bruit.     Trachea: No tracheal deviation.  Cardiovascular:     Rate and Rhythm: Normal rate and regular rhythm.     Pulses: Normal pulses.     Heart sounds: Normal heart sounds. No murmur. No friction rub. No gallop.  Pulmonary:     Effort: Pulmonary effort is normal. No accessory muscle usage or respiratory distress.     Breath sounds: Normal breath sounds.  Abdominal:     General: Bowel sounds are normal. There is no distension.     Palpations: Abdomen is soft. There is no mass.     Tenderness: There is no abdominal tenderness. There is no guarding or rebound.  Genitourinary:    Comments: No cva tenderness. Musculoskeletal:        General: No swelling or tenderness.     Comments: CTLS spine, non tender, aligned, no step off. Good rom bil extremities without pain or focal bony tenderness.   Skin:    General: Skin is warm and dry.     Findings: No rash.  Neurological:     Mental Status: He is alert.     Comments: Alert, speech clear. Motor intact bil, stre 5/5. sens grossly intact.   Psychiatric:        Mood and Affect: Mood normal.      ED Treatments / Results  Labs (all labs ordered are listed, but only abnormal results are displayed) Results for orders placed or performed during the hospital encounter of 00/17/49  Basic metabolic panel  Result Value Ref Range   Sodium 138 135 - 145 mmol/L   Potassium 3.9 3.5 - 5.1 mmol/L   Chloride 103 98 - 111 mmol/L   CO2 24 22 - 32 mmol/L   Glucose, Bld 100 (H) 70 - 99 mg/dL     BUN 7 (L) 8 - 23 mg/dL   Creatinine, Ser 1.00 0.61 - 1.24 mg/dL   Calcium 8.5 (L) 8.9 - 10.3 mg/dL   GFR calc non Af Amer >60 >60 mL/min   GFR calc Af Amer >60 >60 mL/min   Anion gap 11 5 - 15  CBC  Result Value Ref Range   WBC 9.6 4.0 - 10.5 K/uL   RBC 4.67 4.22 - 5.81 MIL/uL   Hemoglobin 13.8 13.0 - 17.0 g/dL   HCT 42.5 39.0 - 52.0 %   MCV 91.0 80.0 - 100.0 fL   MCH 29.6 26.0 - 34.0 pg   MCHC 32.5 30.0 - 36.0 g/dL   RDW 16.6 (H) 11.5 - 15.5 %   Platelets 270 150 - 400 K/uL   nRBC 0.0 0.0 - 0.2 %   Ct Head Wo Contrast  Result Date: 06/04/2018 CLINICAL DATA:  78 y/o M; found on floor, possible unwitnessed syncope versus seizure. Slight confusion. EXAM: CT HEAD WITHOUT CONTRAST TECHNIQUE: Contiguous axial images were obtained from the base of the skull through the vertex without intravenous contrast. 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 No acute infarct, hemorrhage, hydrocephalus, extra-axial collection, or mass effect. Nonspecific white matter hypodensities are compatible with chronic microvascular ischemic changes and there is volume loss of the brain which is stable from the prior study. Vascular: Calcific atherosclerosis of the carotid siphons. No hyperdense vessel identified. Skull: Normal. Negative for fracture or focal lesion. Sinuses/Orbits: No acute finding. Other: Debris within the external auditory canals, likely cerumen. IMPRESSION: 1. No acute intracranial abnormality identified. 2. Stable chronic microvascular ischemic changes and volume loss of the brain. 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 Electronically Signed   By: Kristine Garbe M.D.   On: 06/04/2018 20:58    EKG EKG Interpretation  Date/Time:  Tuesday June 04 2018 22:23:29 EST Ventricular Rate:  67 PR Interval:  138 QRS Duration: 82 QT Interval:  434 QTC Calculation: 458 R Axis:   55 Text Interpretation:  Normal sinus rhythm Nonspecific T wave abnormality Confirmed by Lajean Saver [id number]) on 06/04/2018 10:29:37 PM   Radiology Ct Head Wo Contrast  Result Date: 06/04/2018 CLINICAL DATA:  78 y/o M; found on floor, possible unwitnessed syncope versus seizure. Slight confusion. EXAM: CT HEAD WITHOUT CONTRAST TECHNIQUE: Contiguous axial images were obtained from the base of the skull through the vertex without intravenous contrast. 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 No acute infarct, hemorrhage, hydrocephalus, extra-axial collection, or mass effect. Nonspecific white matter hypodensities are compatible with chronic microvascular ischemic changes and there is volume loss of the brain which is stable from the prior study. Vascular: Calcific atherosclerosis of the carotid siphons. No hyperdense vessel identified. Skull: Normal. Negative for fracture or focal lesion. Sinuses/Orbits: No acute finding. Other: Debris within the external auditory canals, likely cerumen. IMPRESSION: 1. No acute intracranial abnormality identified. 2. Stable chronic microvascular ischemic changes and volume loss of the brain. 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 Electronically Signed   By: Kristine Garbe M.D.   On: 06/04/2018 20:58    Procedures Procedures (including critical care time)  Medications Ordered in ED Medications - No data to display   Initial Impression / Assessment and Plan / ED Course  I have reviewed the triage vital signs and the nursing notes.  Pertinent labs & imaging results that were available  during my care of the patient were reviewed by me and considered in my medical decision making (see chart for details).  Continuous pulse ox and monitor.   Ecg. Labs. Imaging.   Reviewed nursing notes and prior charts for additional history.   Labs reviewed - chem normal.   Ct reviewed - neg acute.  Recheck, tolerating po. No faintness or dizziness. Ambulatory in hall with steady gait.   Pt currently appears stable for d/c.    Final Clinical Impressions(s) / ED Diagnoses   Final diagnoses:  None    ED Discharge Orders    None       Lajean Saver, MD 06/04/18 2249

## 2018-06-04 NOTE — ED Notes (Signed)
[facility] CALLED TO TRANSPORT PT BACK TO FACILITY

## 2018-06-24 ENCOUNTER — Other Ambulatory Visit: Payer: Self-pay

## 2018-06-24 DIAGNOSIS — I779 Disorder of arteries and arterioles, unspecified: Secondary | ICD-10-CM

## 2018-06-24 DIAGNOSIS — F17200 Nicotine dependence, unspecified, uncomplicated: Secondary | ICD-10-CM

## 2018-07-01 ENCOUNTER — Encounter (HOSPITAL_COMMUNITY): Payer: Medicare Other

## 2018-07-01 ENCOUNTER — Encounter: Payer: Self-pay | Admitting: Family

## 2018-07-01 ENCOUNTER — Ambulatory Visit: Payer: Medicare Other | Admitting: Family

## 2018-07-01 ENCOUNTER — Other Ambulatory Visit (HOSPITAL_COMMUNITY): Payer: Medicare Other

## 2018-09-17 ENCOUNTER — Other Ambulatory Visit: Payer: Self-pay

## 2018-09-17 ENCOUNTER — Encounter (HOSPITAL_COMMUNITY): Payer: Self-pay

## 2018-09-17 ENCOUNTER — Emergency Department (HOSPITAL_COMMUNITY): Payer: Medicare Other

## 2018-09-17 ENCOUNTER — Emergency Department (HOSPITAL_COMMUNITY)
Admission: EM | Admit: 2018-09-17 | Discharge: 2018-09-17 | Disposition: A | Discharge status: Home / Self Care | Payer: Medicare Other | Attending: Emergency Medicine | Admitting: Emergency Medicine

## 2018-09-17 DIAGNOSIS — I10 Essential (primary) hypertension: Secondary | ICD-10-CM | POA: Diagnosis not present

## 2018-09-17 DIAGNOSIS — F0390 Unspecified dementia without behavioral disturbance: Secondary | ICD-10-CM | POA: Insufficient documentation

## 2018-09-17 DIAGNOSIS — Z79899 Other long term (current) drug therapy: Secondary | ICD-10-CM | POA: Insufficient documentation

## 2018-09-17 DIAGNOSIS — J449 Chronic obstructive pulmonary disease, unspecified: Secondary | ICD-10-CM | POA: Diagnosis not present

## 2018-09-17 DIAGNOSIS — N3000 Acute cystitis without hematuria: Secondary | ICD-10-CM

## 2018-09-17 DIAGNOSIS — R42 Dizziness and giddiness: Secondary | ICD-10-CM | POA: Insufficient documentation

## 2018-09-17 DIAGNOSIS — Z89422 Acquired absence of other left toe(s): Secondary | ICD-10-CM | POA: Diagnosis not present

## 2018-09-17 DIAGNOSIS — R531 Weakness: Secondary | ICD-10-CM | POA: Diagnosis present

## 2018-09-17 DIAGNOSIS — F17210 Nicotine dependence, cigarettes, uncomplicated: Secondary | ICD-10-CM | POA: Diagnosis not present

## 2018-09-17 HISTORY — DX: Chronic obstructive pulmonary disease, unspecified: J44.9

## 2018-09-17 HISTORY — DX: Essential (primary) hypertension: I10

## 2018-09-17 HISTORY — DX: Unspecified atrial fibrillation: I48.91

## 2018-09-17 HISTORY — DX: Unspecified osteoarthritis, unspecified site: M19.90

## 2018-09-17 LAB — URINALYSIS, ROUTINE W REFLEX MICROSCOPIC
Bilirubin Urine: NEGATIVE
Glucose, UA: NEGATIVE mg/dL
Ketones, ur: NEGATIVE mg/dL
Nitrite: NEGATIVE
Protein, ur: 100 mg/dL — AB
RBC / HPF: >50 RBC/hpf — ABNORMAL HIGH (ref 0–5)
Specific Gravity, Urine: 1.018 (ref 1.005–1.030)
WBC, UA: >50 WBC/hpf — ABNORMAL HIGH (ref 0–5)
pH: 6 (ref 5.0–8.0)

## 2018-09-17 LAB — COMPREHENSIVE METABOLIC PANEL
ALT: 7 U/L (ref 0–44)
AST: 12 U/L — ABNORMAL LOW (ref 15–41)
Albumin: 3.4 g/dL — ABNORMAL LOW (ref 3.5–5.0)
Alkaline Phosphatase: 61 U/L (ref 38–126)
Anion gap: 7 (ref 5–15)
BUN: 14 mg/dL (ref 8–23)
CO2: 27 mmol/L (ref 22–32)
Calcium: 8.5 mg/dL — ABNORMAL LOW (ref 8.9–10.3)
Chloride: 105 mmol/L (ref 98–111)
Creatinine, Ser: 1.09 mg/dL (ref 0.61–1.24)
GFR calc Af Amer: >60 mL/min (ref >60)
GFR calc non Af Amer: >60 mL/min (ref >60)
Glucose, Bld: 93 mg/dL (ref 70–99)
Potassium: 3.7 mmol/L (ref 3.5–5.1)
Sodium: 139 mmol/L (ref 135–145)
Total Bilirubin: 0.5 mg/dL (ref 0.3–1.2)
Total Protein: 7.2 g/dL (ref 6.5–8.1)

## 2018-09-17 LAB — CBC WITH DIFFERENTIAL/PLATELET
Abs Immature Granulocytes: 0.02 10*3/uL (ref 0.00–0.07)
Basophils Absolute: 0.1 10*3/uL (ref 0.0–0.1)
Basophils Relative: 1 %
Eosinophils Absolute: 0.1 10*3/uL (ref 0.0–0.5)
Eosinophils Relative: 1 %
HCT: 43.8 % (ref 39.0–52.0)
Hemoglobin: 14.2 g/dL (ref 13.0–17.0)
Immature Granulocytes: 0 %
Lymphocytes Relative: 21 %
Lymphs Abs: 1.5 10*3/uL (ref 0.7–4.0)
MCH: 29.7 pg (ref 26.0–34.0)
MCHC: 32.4 g/dL (ref 30.0–36.0)
MCV: 91.6 fL (ref 80.0–100.0)
Monocytes Absolute: 0.5 10*3/uL (ref 0.1–1.0)
Monocytes Relative: 7 %
Neutro Abs: 5 10*3/uL (ref 1.7–7.7)
Neutrophils Relative %: 70 %
Platelets: 299 10*3/uL (ref 150–400)
RBC: 4.78 MIL/uL (ref 4.22–5.81)
RDW: 16 % — ABNORMAL HIGH (ref 11.5–15.5)
WBC: 7.2 10*3/uL (ref 4.0–10.5)
nRBC: 0 % (ref 0.0–0.2)

## 2018-09-17 LAB — TYPE AND SCREEN
ABO/RH(D): A POS
Antibody Screen: NEGATIVE

## 2018-09-17 LAB — ETHANOL: Alcohol, Ethyl (B): <10 mg/dL (ref <10)

## 2018-09-17 LAB — CBG MONITORING, ED: Glucose-Capillary: 109 mg/dL — ABNORMAL HIGH (ref 70–99)

## 2018-09-17 LAB — PROTIME-INR
INR: 1 (ref 0.8–1.2)
Prothrombin Time: 13.2 seconds (ref 11.4–15.2)

## 2018-09-17 MED ORDER — CEPHALEXIN 500 MG PO CAPS: 500.0000 mg | ORAL_CAPSULE | Freq: Once | ORAL | Status: DC

## 2018-09-17 MED ORDER — CEPHALEXIN 500 MG PO CAPS: 500.0000 mg | ORAL_CAPSULE | Freq: Three times a day (TID) | ORAL | 0 refills | Status: AC

## 2018-09-17 MED ORDER — SODIUM CHLORIDE 0.9 % IV SOLN
INTRAVENOUS | Status: DC
  Administered 2018-09-17: 16:00:00 via INTRAVENOUS

## 2018-09-17 NOTE — ED Notes (Signed)
Report called to [facility] night supervisor and is aware that pt needs first dose keflex

## 2018-09-17 NOTE — ED Triage Notes (Signed)
Pt states he was walking, began to feel dizzy, and stumbled. Pt denies falling, or any injury.

## 2018-09-17 NOTE — ED Notes (Addendum)
Pt ambulated from bed to hall and back to bed UNAD.  Pt does not complain of dizziness or headache.  Gait slightly unsteady when first up but pt sts that is normal, and "it's because of my shoes" he states.  RN notified.

## 2018-09-17 NOTE — Discharge Instructions (Signed)
Take the antibiotics as prescribed.  Follow-up with your primary care doctor to make sure your symptoms are improving

## 2018-09-17 NOTE — ED Provider Notes (Signed)
[facility] DEPT Provider Note   CSN: [id number] Arrival date & time: 09/17/18  1405    History   Chief Complaint Chief Complaint  Patient presents with  . Dizziness    HPI Leon Foster is a 78 y.o. male.     HPI Patient presents to the emergency room for evaluation of an episode of weakness and lightheadedness.  Patient states he was at the store about an hour ago.  He started to feel lightheaded and went down to the ground.  Patient denies that he completely lost consciousness.  He states he is feeling better now.  He denies having any trouble with headache or chest pain he is not having any trouble with shortness of breath or abdominal pain he denies any trouble with focal numbness or weakness. Past Medical History:  Diagnosis Date  . Acute renal failure ([place]) 11/2014   from recent hospitalization notes  . Alcoholic ([place])   . Arthritis    osteoarthritis  . Atrial fibrillation ([place])   . AVM (arteriovenous malformation) of colon    colonoscopy 2011 [facility]   . Cardiomyopathy ([place])    from recent hospitalization notes  . Confusion   . COPD (chronic obstructive pulmonary disease) ([place])   . Dementia ([place])   . Gangrene of digit    left toe  . Hypertension   . Leukocytosis   . PAD (peripheral artery disease) ([place])   . Pulmonary hypertension ([place])   . Seizures ([place])     1 time seizure on 12/28/14 - seen in ED  . Tobacco dependence     Patient Active Problem List   Diagnosis Date Noted  . H/O onychomycosis 12/30/2015  . Dementia ([place]) 10/07/2015  . Protein-calorie malnutrition ([place]) 10/07/2015  . H/O ETOH abuse 10/07/2015  . 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 HTN (hypertension) 12/21/2014  . Anemia, chronic disease 12/21/2014  . Pulmonary hypertension ([place])   . 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 Tobacco abuse 11/27/2014  . Alcohol dependence ([place]) 11/27/2014  . PAD (peripheral artery  disease) ([place]) 11/27/2014  . Pain of toe of left foot-Great, and Left 2nd Toe- Right Leg/Calf 11/26/2014  . POLYP, COLON, RECURRENT 07/06/2009    Past Surgical History:  Procedure Laterality Date  . AMPUTATION Left 01/01/2015   Procedure: Left Transmetatarsal Amputation;  Surgeon: Newt Minion, MD;  Location: [facility];  Service: Orthopedics;  Laterality: Left;  . LOWER EXTREMITY ANGIOGRAM Bilateral 12/14/2014   Procedure: Lower Extremity Angiogram;  Surgeon: Elam Dutch, MD;  Location: [facility];  Service: Cardiovascular;  Laterality: Bilateral;  . PERIPHERAL VASCULAR CATHETERIZATION N/A 12/14/2014   Procedure: Abdominal Aortogram;  Surgeon: Elam Dutch, MD;  Location: [facility];  Service: Cardiovascular;  Laterality: N/A;  . PERIPHERAL VASCULAR CATHETERIZATION Left 12/14/2014   Procedure: Peripheral Vascular Intervention;  Surgeon: Elam Dutch, MD;  Location: [facility];  Service: Cardiovascular;  Laterality: Left;  SFA        Home Medications    Prior to Admission medications   Medication Sig Start Date End Date Taking? Authorizing Provider  acamprosate (CAMPRAL) 333 MG tablet Take 333 mg by mouth 3 (three) times daily with meals.   Yes [provider]  clopidogrel (PLAVIX) 75 MG tablet Take 1 tablet (75 mg total) by mouth daily with breakfast. 12/15/14  Yes Bonnielee Haff, MD  donepezil (ARICEPT) 10 MG tablet Take 10 mg by  mouth at bedtime.   Yes [provider]  metoprolol tartrate (LOPRESSOR) 25 MG tablet Take 0.5 tablets (12.5 mg total) by mouth 2 (two) times daily. Patient taking differently: Take 12.5 mg by mouth daily.  12/15/14  Yes Bonnielee Haff, MD  naltrexone (DEPADE) 50 MG tablet Take 50 mg by mouth 2 (two) times a day.   Yes [provider]  acetaminophen (TYLENOL) 325 MG tablet Take 650 mg by mouth every 6 (six) hours as needed for headache.    [provider]  cephALEXin (KEFLEX) 500 MG capsule  Take 1 capsule (500 mg total) by mouth 3 (three) times daily for 7 days. 09/17/18 09/24/18  Dorie Rank, MD  Multiple Vitamin (MULTIVITAMIN WITH MINERALS) TABS tablet Take 1 tablet by mouth daily. Patient not taking: Reported on 09/17/2018 12/15/14   Bonnielee Haff, MD    Family History Family History  Problem Relation Age of Onset  . Stroke Sister   . Deep vein thrombosis Sister   . Cancer Brother   . Diabetes Brother   . Hyperlipidemia Brother   . Hypertension Brother   . Heart disease Father   . Hypertension Sister     Social History Social History   Tobacco Use  . Smoking status: Current Some Day Smoker    Years: 55.00    Types: Cigarettes  . Smokeless tobacco: Never Used  Substance Use Topics  . Alcohol use: No    Alcohol/week: 0.0 standard drinks    Comment: Pt has not drank while in SNF  . Drug use: No     Allergies   Patient has no known allergies.   Review of Systems Review of Systems  All other systems reviewed and are negative.    Physical Exam Updated Vital Signs BP 107/66   Pulse (!) 59   Temp 97.6 F (36.4 C) (Oral)   Resp 14   Wt 59 kg   SpO2 100%   BMI 20.37 kg/m   Physical Exam Vitals signs and nursing note reviewed.  Constitutional:      General: He is not in acute distress.    Appearance: He is well-developed.     Comments: Thin, underweight  HENT:     Head: Normocephalic and atraumatic.     Right Ear: External ear normal.     Left Ear: External ear normal.  Eyes:     General: No scleral icterus.       Right eye: No discharge.        Left eye: No discharge.     Conjunctiva/sclera: Conjunctivae normal.  Neck:     Musculoskeletal: Neck supple.     Trachea: No tracheal deviation.  Cardiovascular:     Rate and Rhythm: Normal rate and regular rhythm.  Pulmonary:     Effort: Pulmonary effort is normal. No respiratory distress.     Breath sounds: Normal breath sounds. No stridor. No wheezing or rales.  Abdominal:     General: Bowel  sounds are normal. There is no distension.     Palpations: Abdomen is soft.     Tenderness: There is no abdominal tenderness. There is no guarding or rebound.  Musculoskeletal:        General: No tenderness.  Skin:    General: Skin is warm and dry.     Findings: No rash.  Neurological:     Mental Status: He is alert and oriented to person, place, and time.     Cranial Nerves: No cranial nerve deficit (No  facial droop, extraocular movements intact, tongue midline ).     Sensory: No sensory deficit.     Motor: No abnormal muscle tone or seizure activity.     Coordination: Coordination normal.     Comments: No pronator drift bilateral upper extrem, able to hold both legs off bed for 5 seconds, sensation intact in all extremities, no visual field cuts, no left or right sided neglect, normal finger-nose exam bilaterally, no nystagmus noted       ED Treatments / Results  Labs (all labs ordered are listed, but only abnormal results are displayed) Labs Reviewed  CBC WITH DIFFERENTIAL/PLATELET - Abnormal; Notable for the following components:      Result Value   RDW 16.0 (*)    All other components within normal limits  COMPREHENSIVE METABOLIC PANEL - Abnormal; Notable for the following components:   Calcium 8.5 (*)    Albumin 3.4 (*)    AST 12 (*)    All other components within normal limits  URINALYSIS, ROUTINE W REFLEX MICROSCOPIC - Abnormal; Notable for the following components:   Color, Urine AMBER (*)    APPearance CLOUDY (*)    Hgb urine dipstick LARGE (*)    Protein, ur 100 (*)    Leukocytes,Ua LARGE (*)    RBC / HPF >50 (*)    WBC, UA >50 (*)    Bacteria, UA MANY (*)    All other components within normal limits  CBG MONITORING, ED - Abnormal; Notable for the following components:   Glucose-Capillary 109 (*)    All other components within normal limits  PROTIME-INR  ETHANOL  TYPE AND SCREEN    EKG EKG Interpretation  Date/Time:  Tuesday September 17 2018 14:32:51 EDT  Ventricular Rate:  59 PR Interval:    QRS Duration: 93 QT Interval:  431 QTC Calculation: 427 R Axis:   60 Text Interpretation:  Sinus rhythm Probable anteroseptal infarct, old No significant change since last tracing Confirmed by Dorie Rank [phone]) on 09/17/2018 2:52:35 PM   Radiology Ct Head Wo Contrast  Result Date: 09/17/2018 CLINICAL DATA:  Ataxia, began feeling dizzy EXAM: CT HEAD WITHOUT CONTRAST TECHNIQUE: Contiguous axial images were obtained from the base of the skull through the vertex without intravenous contrast. COMPARISON:  06/04/2018 FINDINGS: Brain: No evidence of acute infarction, hemorrhage, extra-axial collection, ventriculomegaly, or mass effect. Generalized cerebral atrophy. Periventricular white matter low attenuation likely secondary to microangiopathy. Vascular: Cerebrovascular atherosclerotic calcifications are noted. Skull: Negative for fracture or focal lesion. Sinuses/Orbits: Visualized portions of the orbits are unremarkable. Visualized portions of the paranasal sinuses and mastoid air cells are unremarkable. Other: None. IMPRESSION: 1. No acute intracranial pathology. Electronically Signed   By: Kathreen Devoid   On: 09/17/2018 16:17   Dg Chest Port 1 View  Result Date: 09/17/2018 CLINICAL DATA:  Dizziness today while walking. EXAM: PORTABLE CHEST 1 VIEW COMPARISON:  Single-view of the chest 01/02/2018. FINDINGS: The lungs are emphysematous but clear. Heart size is normal. No pneumothorax or pleural fluid. IMPRESSION: No acute disease. Emphysema. Electronically Signed   By: Inge Rise M.D.   On: 09/17/2018 16:00    Procedures Procedures (including critical care time)  Medications Ordered in ED Medications  0.9 %  sodium chloride infusion ( Intravenous New Bag/Given 09/17/18 1625)  cephALEXin (KEFLEX) capsule 500 mg (has no administration in time range)     Initial Impression / Assessment and Plan / ED Course  I have reviewed the triage vital signs and the  nursing  notes.  Pertinent labs & imaging results that were available during my care of the patient were reviewed by me and considered in my medical decision making (see chart for details).  Clinical Course as of Sep 16 1909  Tue Sep 17, 2018  1907 Patient is feeling better.  He was able to ambulate without difficulty.   [JK]  1907 Labs reviewed.  No acute findings noted on CT scan.  CBC and electrolyte panel unremarkable.  Urinalysis does suggest a urinary tract infection.   [JK]  1911 Attempted to call sister listed as pt contact.  NO answer   [JK]    Clinical Course User Index [JK] Dorie Rank, MD    Patient presented to the ED for evaluation of an episode of weakness earlier.  Sounds like patient had a near syncopal episode.  He denies full loss of consciousness.  Patient remained rather asymptomatic in the ED.  He was able to walk around without difficulty.  Urinalysis does suggest UTI.  Patient was given a dose of Keflex.  Plan on discharge home with a course of antibiotics.  Final Clinical Impressions(s) / ED Diagnoses   Final diagnoses:  Acute cystitis without hematuria    ED Discharge Orders         Ordered    cephALEXin (KEFLEX) 500 MG capsule  3 times daily     09/17/18 1909           Dorie Rank, MD 09/17/18 1911

## 2018-09-17 NOTE — ED Notes (Signed)
Patient transported to CT 

## 2018-09-18 LAB — ABO/RH: ABO/RH(D): A POS

## 2018-11-14 ENCOUNTER — Emergency Department (HOSPITAL_COMMUNITY): Payer: Medicare Other

## 2018-11-14 ENCOUNTER — Other Ambulatory Visit: Payer: Self-pay

## 2018-11-14 ENCOUNTER — Emergency Department (HOSPITAL_COMMUNITY)
Admission: EM | Admit: 2018-11-14 | Discharge: 2018-11-14 | Disposition: A | Discharge status: Home / Self Care | Payer: Medicare Other | Attending: Emergency Medicine | Admitting: Emergency Medicine

## 2018-11-14 DIAGNOSIS — I639 Cerebral infarction, unspecified: Secondary | ICD-10-CM

## 2018-11-14 DIAGNOSIS — R1110 Vomiting, unspecified: Secondary | ICD-10-CM | POA: Diagnosis not present

## 2018-11-14 DIAGNOSIS — R4781 Slurred speech: Secondary | ICD-10-CM | POA: Diagnosis not present

## 2018-11-14 DIAGNOSIS — I10 Essential (primary) hypertension: Secondary | ICD-10-CM | POA: Diagnosis not present

## 2018-11-14 DIAGNOSIS — I4891 Unspecified atrial fibrillation: Secondary | ICD-10-CM | POA: Diagnosis not present

## 2018-11-14 DIAGNOSIS — Z720 Tobacco use: Secondary | ICD-10-CM | POA: Diagnosis not present

## 2018-11-14 DIAGNOSIS — Z7901 Long term (current) use of anticoagulants: Secondary | ICD-10-CM | POA: Diagnosis not present

## 2018-11-14 DIAGNOSIS — Z79899 Other long term (current) drug therapy: Secondary | ICD-10-CM | POA: Insufficient documentation

## 2018-11-14 DIAGNOSIS — F0390 Unspecified dementia without behavioral disturbance: Secondary | ICD-10-CM | POA: Diagnosis not present

## 2018-11-14 DIAGNOSIS — J449 Chronic obstructive pulmonary disease, unspecified: Secondary | ICD-10-CM | POA: Diagnosis not present

## 2018-11-14 DIAGNOSIS — R519 Headache, unspecified: Secondary | ICD-10-CM

## 2018-11-14 DIAGNOSIS — R51 Headache: Secondary | ICD-10-CM | POA: Diagnosis present

## 2018-11-14 LAB — CBC
HCT: 42.8 % (ref 39.0–52.0)
Hemoglobin: 14 g/dL (ref 13.0–17.0)
MCH: 30 pg (ref 26.0–34.0)
MCHC: 32.7 g/dL (ref 30.0–36.0)
MCV: 91.8 fL (ref 80.0–100.0)
Platelets: 315 10*3/uL (ref 150–400)
RBC: 4.66 MIL/uL (ref 4.22–5.81)
RDW: 16.3 % — ABNORMAL HIGH (ref 11.5–15.5)
WBC: 6.5 10*3/uL (ref 4.0–10.5)
nRBC: 0 % (ref 0.0–0.2)

## 2018-11-14 LAB — DIFFERENTIAL
Abs Immature Granulocytes: 0.02 10*3/uL (ref 0.00–0.07)
Basophils Absolute: 0.1 10*3/uL (ref 0.0–0.1)
Basophils Relative: 2 %
Eosinophils Absolute: 0.2 10*3/uL (ref 0.0–0.5)
Eosinophils Relative: 3 %
Immature Granulocytes: 0 %
Lymphocytes Relative: 42 %
Lymphs Abs: 2.7 10*3/uL (ref 0.7–4.0)
Monocytes Absolute: 0.4 10*3/uL (ref 0.1–1.0)
Monocytes Relative: 6 %
Neutro Abs: 3.1 10*3/uL (ref 1.7–7.7)
Neutrophils Relative %: 47 %

## 2018-11-14 LAB — CBG MONITORING, ED: Glucose-Capillary: 76 mg/dL (ref 70–99)

## 2018-11-14 LAB — COMPREHENSIVE METABOLIC PANEL
ALT: 6 U/L (ref 0–44)
AST: 11 U/L — ABNORMAL LOW (ref 15–41)
Albumin: 3 g/dL — ABNORMAL LOW (ref 3.5–5.0)
Alkaline Phosphatase: 53 U/L (ref 38–126)
Anion gap: 8 (ref 5–15)
BUN: 11 mg/dL (ref 8–23)
CO2: 24 mmol/L (ref 22–32)
Calcium: 8.5 mg/dL — ABNORMAL LOW (ref 8.9–10.3)
Chloride: 107 mmol/L (ref 98–111)
Creatinine, Ser: 1.28 mg/dL — ABNORMAL HIGH (ref 0.61–1.24)
GFR calc Af Amer: >60 mL/min (ref >60)
GFR calc non Af Amer: 54 mL/min — ABNORMAL LOW (ref >60)
Glucose, Bld: 110 mg/dL — ABNORMAL HIGH (ref 70–99)
Potassium: 4.4 mmol/L (ref 3.5–5.1)
Sodium: 139 mmol/L (ref 135–145)
Total Bilirubin: 1.2 mg/dL (ref 0.3–1.2)
Total Protein: 6.4 g/dL — ABNORMAL LOW (ref 6.5–8.1)

## 2018-11-14 LAB — PROTIME-INR
INR: 1.2 (ref 0.8–1.2)
Prothrombin Time: 15 seconds (ref 11.4–15.2)

## 2018-11-14 LAB — I-STAT CHEM 8, ED
BUN: 12 mg/dL (ref 8–23)
Calcium, Ion: 1.03 mmol/L — ABNORMAL LOW (ref 1.15–1.40)
Chloride: 106 mmol/L (ref 98–111)
Creatinine, Ser: 1.1 mg/dL (ref 0.61–1.24)
Glucose, Bld: 111 mg/dL — ABNORMAL HIGH (ref 70–99)
HCT: 45 % (ref 39.0–52.0)
Hemoglobin: 15.3 g/dL (ref 13.0–17.0)
Potassium: 4 mmol/L (ref 3.5–5.1)
Sodium: 139 mmol/L (ref 135–145)
TCO2: 21 mmol/L — ABNORMAL LOW (ref 22–32)

## 2018-11-14 LAB — APTT: aPTT: 25 seconds (ref 24–36)

## 2018-11-14 MED ORDER — SODIUM CHLORIDE 0.9 % IV BOLUS
500.0000 mL | Freq: Once | INTRAVENOUS | Status: AC
  Administered 2018-11-14: 500 mL via INTRAVENOUS

## 2018-11-14 MED ORDER — SODIUM CHLORIDE 0.9% FLUSH
3.0000 mL | Freq: Once | INTRAVENOUS | Status: DC
Start: 2018-11-14 — End: 2018-11-14

## 2018-11-14 NOTE — ED Notes (Signed)
Called ptar for pt transport  

## 2018-11-14 NOTE — Discharge Instructions (Addendum)
Continue medications as previously prescribed.  Return to the emergency department if symptoms significantly worsen or change. 

## 2018-11-14 NOTE — ED Provider Notes (Signed)
[facility] EMERGENCY DEPARTMENT Provider Note   CSN: [id number] Arrival date & time: 11/14/18  1058     History   Chief Complaint Chief Complaint  Patient presents with  . Code Stroke    HPI Leon Foster is a 78 y.o. male.     Patient is a 78 year old male with past medical history of COPD, hypertension, seizures, atrial fibrillation, dementia, and alcohol abuse in the past.  He was sent from his extended care facility for evaluation of possible stroke.  Patient apparently became unsteady while walking down the hallway.  He complained of a headache, leaned up against the wall, then vomited.  They stated that he was off balance and his speech was slurred.  He was then sent here as a code stroke.  Upon my evaluation, patient has no complaints.  History is somewhat limited secondary to dementia.  The history is provided by the patient.    Past Medical History:  Diagnosis Date  . Acute renal failure ([place]) 11/2014   from recent hospitalization notes  . Alcoholic ([place])   . Arthritis    osteoarthritis  . Atrial fibrillation ([place])   . AVM (arteriovenous malformation) of colon    colonoscopy 2011 [facility]   . Cardiomyopathy ([place])    from recent hospitalization notes  . Confusion   . COPD (chronic obstructive pulmonary disease) ([place])   . Dementia ([place])   . Gangrene of digit    left toe  . Hypertension   . Leukocytosis   . PAD (peripheral artery disease) ([place])   . Pulmonary hypertension ([place])   . Seizures ([place])     1 time seizure on 12/28/14 - seen in ED  . Tobacco dependence     Patient Active Problem List   Diagnosis Date Noted  . H/O onychomycosis 12/30/2015  . Dementia ([place]) 10/07/2015  . Protein-calorie malnutrition ([place]) 10/07/2015  . H/O ETOH abuse 10/07/2015  . 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 HTN (hypertension) 12/21/2014  . Anemia, chronic disease 12/21/2014  . Pulmonary hypertension ([place])   .  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 Tobacco abuse 11/27/2014  . Alcohol dependence ([place]) 11/27/2014  . PAD (peripheral artery disease) ([place]) 11/27/2014  . Pain of toe of left foot-Great, and Left 2nd Toe- Right Leg/Calf 11/26/2014  . POLYP, COLON, RECURRENT 07/06/2009    Past Surgical History:  Procedure Laterality Date  . AMPUTATION Left 01/01/2015   Procedure: Left Transmetatarsal Amputation;  Surgeon: Newt Minion, MD;  Location: [facility];  Service: Orthopedics;  Laterality: Left;  . LOWER EXTREMITY ANGIOGRAM Bilateral 12/14/2014   Procedure: Lower Extremity Angiogram;  Surgeon: Elam Dutch, MD;  Location: [facility];  Service: Cardiovascular;  Laterality: Bilateral;  . PERIPHERAL VASCULAR CATHETERIZATION N/A 12/14/2014   Procedure: Abdominal Aortogram;  Surgeon: Elam Dutch, MD;  Location: [facility];  Service: Cardiovascular;  Laterality: N/A;  . PERIPHERAL VASCULAR CATHETERIZATION Left 12/14/2014   Procedure: Peripheral Vascular Intervention;  Surgeon: Elam Dutch, MD;  Location: [facility];  Service: Cardiovascular;  Laterality: Left;  SFA        Home Medications    Prior to Admission medications   Medication Sig Start Date End Date Taking? Authorizing Provider  acamprosate (CAMPRAL) 333 MG tablet Take 333 mg by mouth 3 (three) times daily with meals.    [provider]  acetaminophen (TYLENOL) 325 MG tablet Take 650 mg by  mouth every 6 (six) hours as needed for headache.    [provider]  clopidogrel (PLAVIX) 75 MG tablet Take 1 tablet (75 mg total) by mouth daily with breakfast. 12/15/14   Bonnielee Haff, MD  donepezil (ARICEPT) 10 MG tablet Take 10 mg by mouth at bedtime.    [provider]  metoprolol tartrate (LOPRESSOR) 25 MG tablet Take 0.5 tablets (12.5 mg total) by mouth 2 (two) times daily. Patient taking differently: Take 12.5 mg by mouth daily.  12/15/14   Bonnielee Haff, MD  Multiple Vitamin  (MULTIVITAMIN WITH MINERALS) TABS tablet Take 1 tablet by mouth daily. Patient not taking: Reported on 09/17/2018 12/15/14   Bonnielee Haff, MD  naltrexone (DEPADE) 50 MG tablet Take 50 mg by mouth 2 (two) times a day.    [provider]    Family History Family History  Problem Relation Age of Onset  . Stroke Sister   . Deep vein thrombosis Sister   . Cancer Brother   . Diabetes Brother   . Hyperlipidemia Brother   . Hypertension Brother   . Heart disease Father   . Hypertension Sister     Social History Social History   Tobacco Use  . Smoking status: Current Some Day Smoker    Years: 55.00    Types: Cigarettes  . Smokeless tobacco: Never Used  Substance Use Topics  . Alcohol use: No    Alcohol/week: 0.0 standard drinks    Comment: Pt has not drank while in SNF  . Drug use: No     Allergies   Patient has no known allergies.   Review of Systems Review of Systems  Unable to perform ROS: Dementia     Physical Exam Updated Vital Signs BP 107/73   Pulse (!) 58   Temp (!) 96.8 F (36 C) (Oral)   Resp 16   Ht 5\' 7"  (1.702 m)   Wt 50.7 kg   SpO2 92%   BMI 17.51 kg/m   Physical Exam Vitals signs and nursing note reviewed.  Constitutional:      General: He is not in acute distress.    Appearance: He is well-developed. He is not diaphoretic.     Comments: Patient is a somewhat cachectic appearing 78 year old male in no acute distress.  HENT:     Head: Normocephalic and atraumatic.  Eyes:     Extraocular Movements: Extraocular movements intact.     Pupils: Pupils are equal, round, and reactive to light.  Neck:     Musculoskeletal: Normal range of motion and neck supple.  Cardiovascular:     Rate and Rhythm: Normal rate and regular rhythm.     Heart sounds: No murmur. No friction rub.  Pulmonary:     Effort: Pulmonary effort is normal. No respiratory distress.     Breath sounds: Normal breath sounds. No wheezing or rales.  Abdominal:      General: Bowel sounds are normal. There is no distension.     Palpations: Abdomen is soft.     Tenderness: There is no abdominal tenderness.  Musculoskeletal: Normal range of motion.  Skin:    General: Skin is warm and dry.  Neurological:     General: No focal deficit present.     Mental Status: He is alert and oriented to person, place, and time.     Cranial Nerves: No cranial nerve deficit.     Motor: No weakness.     Coordination: Coordination normal.      ED  Treatments / Results  Labs (all labs ordered are listed, but only abnormal results are displayed) Labs Reviewed  CBC - Abnormal; Notable for the following components:      Result Value   RDW 16.3 (*)    All other components within normal limits  I-STAT CHEM 8, ED - Abnormal; Notable for the following components:   Glucose, Bld 111 (*)    Calcium, Ion 1.03 (*)    TCO2 21 (*)    All other components within normal limits  DIFFERENTIAL  PROTIME-INR  APTT  COMPREHENSIVE METABOLIC PANEL  CBG MONITORING, ED    EKG EKG Interpretation  Date/Time:  Thursday November 14 2018 11:23:33 EDT Ventricular Rate:  51 PR Interval:    QRS Duration: 99 QT Interval:  455 QTC Calculation: 419 R Axis:   36 Text Interpretation:  Sinus bradycardia Anteroseptal infarct, age indeterminate Confirmed by Veryl Speak ([id number]) on 11/14/2018 11:39:22 AM   Radiology Ct Head Code Stroke Wo Contrast  Result Date: 11/14/2018 CLINICAL DATA:  Code stroke. Confusion, hallucinations, and slurred speech. EXAM: CT HEAD WITHOUT CONTRAST TECHNIQUE: Contiguous axial images were obtained from the base of the skull through the vertex without intravenous contrast. COMPARISON:  09/17/2018 FINDINGS: Brain: There is no evidence of acute infarct, intracranial hemorrhage, mass, midline shift, or extra-axial fluid collection. Encephalomalacia is again seen anteriorly in the temporal and frontal lobes bilaterally compatible with remote trauma. A 4 mm rounded  low-density focus along the superior aspect of the left thalamus on axial images is not confirmed to reflect an infarct on reformats and is attributed to volume averaging. There is mild cerebral atrophy. Cerebral white matter hypodensities are unchanged and nonspecific but compatible with mild chronic small vessel ischemic disease. Vascular: Calcified atherosclerosis at the skull base. No hyperdense vessel. Skull: No fracture or focal osseous lesion. Sinuses/Orbits: Visualized paranasal sinuses and mastoid air cells are clear. Orbits are unremarkable. Other: None. ASPECTS [facility] Stroke Program Early CT Score) - Ganglionic level infarction (caudate, lentiform nuclei, internal capsule, insula, M1-M3 cortex): 7 - Supraganglionic infarction (M4-M6 cortex): 3 Total score (0-10 with 10 being normal): 10 IMPRESSION: 1. No evidence of acute intracranial abnormality. 2. ASPECTS is 10. 3. 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 These results were communicated to Dr. Cheral Marker at 11:21 am on 11/14/2018 by text page via the [facility] messaging system. Electronically Signed   By: Logan Bores M.D.   On: 11/14/2018 11:22    Procedures Procedures (including critical care time)  Medications Ordered in ED Medications  sodium chloride flush (NS) 0.9 % injection 3 mL (has no administration in time range)  sodium chloride 0.9 % bolus 500 mL (has no administration in time range)     Initial Impression / Assessment and Plan / ED Course  I have reviewed the triage vital signs and the nursing notes.  Pertinent labs & imaging results that were available during my care of the patient were reviewed by me and considered in my medical decision making (see chart for details).  Patient sent here from his extended care facility for evaluation of possible stroke.  Patient arrived here as a code stroke and was seen immediately by the neurology team.  Patient went for a CT scan of his  head which was negative for bleed or stroke.  Patient then went to MRI which shows no sign of stroke.  Laboratory studies are unremarkable.  Patient has been observed here for several hours and has not had any  further vomiting or issues.  At this point, I feel as though he is appropriate for discharge.  He is to follow-up with primary doctor.  Final Clinical Impressions(s) / ED Diagnoses   Final diagnoses:  None    ED Discharge Orders    None       Veryl Speak, MD 11/14/18 1430

## 2018-11-14 NOTE — ED Notes (Signed)
Cleaned pt's perineal area and applied a clean brief on pt.

## 2018-11-14 NOTE — ED Notes (Signed)
[facility] at bedside for transport.  

## 2018-11-14 NOTE — ED Triage Notes (Addendum)
Arrived via EMS from [facility] rehab facility. CODE STROKE called by EMS LKW [id number] witnessed by staff member. Patient was walking down hallway leaning to the right. Staff sat patient down noticed slurred speech, clammy, headache, and emesis x1. EMS reported CBG 114. Initial BP 90/palpated administered 400 ML NS 0.9 NS seconds BP 107/palpated.

## 2018-11-14 NOTE — Code Documentation (Signed)
78yo male arriving to [facility] via [facility] at 72. Patient from facility where he was reportedly LKW at 1000. Patient noted to be leaning to the right while walking down the hallway. Patient also noted to have headache and slurred speech. EMS called and activated a code stroke. Patient vomited x 1. Stroke team at the bedside on patient arrival. Labs drawn and patient cleared for CT by Dr. Maryan Rued. Patient to CT with team NIHSS 3, see documentation for details and code stroke times. Patient unable to answer NIHSS questions correctly with dysarthria on exam. No acute stroke treatment at this time. Code stroke canceled. Handoff with ED RN Marya Amsler.

## 2018-11-14 NOTE — Consult Note (Addendum)
Neurology Consultation  Reason for Consult: confusion/ aphasia  Referring Physician: Dr. Stark Jock  History is obtained from: EMS  HPI: Leon Foster is a 78 y.o. male with history of tobacco dependence, seizure, peripheral artery disease, hypertension, dementia, confusion, alcohol abuse and renal failure.  Patient was brought to the [facility] ED as a code stroke from [place] home.  At 10:00 he was noted to state he did not feel right and he had a headache.  He was noticed to be stumbling and then slumped over and was found on the floor.  He did have emesis and was noted to have "expressive aphasia" at that time.  EMS was called and patient was brought to the hospital as a code stroke.  While he was in transport he still complained of a headache and it was noticed that he had confusion and slurred speech.  On arrival patient was oriented to place, city, state, but not time.  Patient was immediately brought to CT.  No hemorrhage was seen on CT head. Due exam findings not suggestive of acute stroke, tPA was not administered.  LKW: 1000 tpa given?: no, overall clinical picture not consistent with acute stroke Premorbid modified Rankin scale (mRS): 3 NIHSS 1  ROS: A 14 point ROS was performed and is negative except as noted in the HPI.   Past Medical History:  Diagnosis Date  . Acute renal failure ([place]) 11/2014   from recent hospitalization notes  . Alcoholic ([place])   . Arthritis    osteoarthritis  . Atrial fibrillation ([place])   . AVM (arteriovenous malformation) of colon    colonoscopy 2011 [facility]   . Cardiomyopathy ([place])    from recent hospitalization notes  . Confusion   . COPD (chronic obstructive pulmonary disease) ([place])   . Dementia ([place])   . Gangrene of digit    left toe  . Hypertension   . Leukocytosis   . PAD (peripheral artery disease) ([place])   . Pulmonary hypertension ([place])   . Seizures ([place])     1 time seizure on 12/28/14 - seen in ED  . Tobacco dependence       Family History  Problem Relation Age of Onset  . Stroke Sister   . Deep vein thrombosis Sister   . Cancer Brother   . Diabetes Brother   . Hyperlipidemia Brother   . Hypertension Brother   . Heart disease Father   . Hypertension Sister    Social History:   reports that he has been smoking cigarettes. He has smoked for the past 55.00 years. He has never used smokeless tobacco. He reports that he does not drink alcohol or use drugs. Medications  Current Facility-Administered Medications:  .  sodium chloride flush (NS) 0.9 % injection 3 mL, 3 mL, Intravenous, Once, Plunkett, Whitney, MD  Current Outpatient Medications:  .  acamprosate (CAMPRAL) 333 MG tablet, Take 333 mg by mouth 3 (three) times daily with meals., Disp: , Rfl:  .  acetaminophen (TYLENOL) 325 MG tablet, Take 650 mg by mouth every 6 (six) hours as needed for headache., Disp: , Rfl:  .  clopidogrel (PLAVIX) 75 MG tablet, Take 1 tablet (75 mg total) by mouth daily with breakfast., Disp: , Rfl:  .  donepezil (ARICEPT) 10 MG tablet, Take 10 mg by mouth at bedtime., Disp: , Rfl:  .  metoprolol tartrate (LOPRESSOR) 25 MG tablet, Take 0.5 tablets (12.5 mg total) by mouth 2 (two) times daily. (Patient taking differently: Take  12.5 mg by mouth daily. ), Disp: , Rfl:  .  Multiple Vitamin (MULTIVITAMIN WITH MINERALS) TABS tablet, Take 1 tablet by mouth daily. (Patient not taking: Reported on 09/17/2018), Disp: , Rfl:  .  naltrexone (DEPADE) 50 MG tablet, Take 50 mg by mouth 2 (two) times a day., Disp: , Rfl:    Exam: Current vital signs: Ht 5\' 7"  (1.702 m)   Wt 50.7 kg   BMI 17.51 kg/m  Vital signs in last 24 hours: Weight:  [50.7 kg] 50.7 kg (07/30 1100)  Physical Exam  Constitutional: decreased muscle mass  Psych: Affect appropriate to situation Eyes: No scleral injection HENT: No OP obstructions -poor dentition Head: Normocephalic.  Cardiovascular: Normal rate and regular rhythm.  Respiratory: Effort normal,  non-labored breathing GI: Soft.  No distension. There is no tenderness.  Skin: WDI  Neuro: Mental Status: Patient is awake, alert, oriented to city, state, but not time. Speech fluent in the context of confusion. Repetition intact. Naming intact to common items.  Was unable to give a coherent history and majority history was obtained by EMS.  Patient was able to follow 2/3 steps correctly of a 3 step directional command Cranial Nerves: II: Visual Fields are full with no extinction to DSS. PERRL III,IV, VI: EOMI without ptosis or diplopia.  V: Facial sensation is symmetric to temperature VII: Facial movement is symmetric.  VIII: hearing is intact to voice X: Palate elevates symmetrically XI: Shoulder shrug is symmetric. XII: tongue is midline without atrophy or fasciculations.  Motor: Atrophy diffusely noted without asymmetry. Mildly increased flexor tone in BUE. In the context of the diffuse muscle atrophy, strength is 5/5 bilateral UE, 4+ right LE, 5+ LLE, 5/5 DF and PF Sensory: Sensation is symmetric to light touch and temperature in the arms and legs. No extinction Deep Tendon Reflexes: 2+ RUE, 3+ LUE, --bicep   / 3+RLE, 4+ LLE--- KJ Plantars: Toes are downgoing right. Unable to assess on left due to toe amputations.  Cerebellar: FNF and HKS are intact bilaterally Gait: Deferred  Labs I have reviewed labs in epic and the results pertinent to this consultation are:   CBC    Component Value Date/Time   WBC 7.2 09/17/2018 1517   RBC 4.78 09/17/2018 1517   HGB 14.2 09/17/2018 1517   HCT 43.8 09/17/2018 1517   PLT 299 09/17/2018 1517   MCV 91.6 09/17/2018 1517   MCV 96.1 11/26/2014 1311   MCH 29.7 09/17/2018 1517   MCHC 32.4 09/17/2018 1517   RDW 16.0 (H) 09/17/2018 1517   LYMPHSABS 1.5 09/17/2018 1517   MONOABS 0.5 09/17/2018 1517   EOSABS 0.1 09/17/2018 1517   BASOSABS 0.1 09/17/2018 1517    CMP     Component Value Date/Time   NA 139 09/17/2018 1517   NA 140  09/29/2016   K 3.7 09/17/2018 1517   CL 105 09/17/2018 1517   CO2 27 09/17/2018 1517   GLUCOSE 93 09/17/2018 1517   BUN 14 09/17/2018 1517   BUN 13 09/29/2016   CREATININE 1.09 09/17/2018 1517   CREATININE 5.27 (H) 11/26/2014 1308   CALCIUM 8.5 (L) 09/17/2018 1517   PROT 7.2 09/17/2018 1517   ALBUMIN 3.4 (L) 09/17/2018 1517   AST 12 (L) 09/17/2018 1517   ALT 7 09/17/2018 1517   ALKPHOS 61 09/17/2018 1517   BILITOT 0.5 09/17/2018 1517   GFRNONAA >60 09/17/2018 1517   GFRNONAA 10 (L) 11/26/2014 1308   GFRAA >60 09/17/2018 1517   GFRAA 12 (L)  11/26/2014 1308    Lipid Panel     Component Value Date/Time   CHOL 104 04/27/2016   TRIG 79 04/27/2016   HDL 31 (A) 04/27/2016   LDLCALC 57 04/27/2016     Imaging I have reviewed the images obtained:  CT-scan of the brain-head CT showed no evidence of acute intracranial abnormalities.  Chronic bilateral frontal and temporal lobe encephalomalacia compatible with remote trauma  MRI examination of the brain-ordered  Etta Quill PA-C [facility] [phone] 11/14/2018, 11:09 AM    Assessment: 78 year old male presenting to [facility] ED as a code stroke.   1. On arrival patient had improved however still had headache and slurred speech. Exam was nonfocal and overall presentation was not consistent with stroke. Most likely etiology is transient AMS in the context of nausea (may have been hypotensive or transiently hypoglycemic at SNF) and his underlying dementia.  2. CT of head did not show any acute stroke or hemorrhage.   3. Due to low NIH stroke scale of 1 for dysarthria, higher likelihood of etiologies for his presentation other than stroke and exam findings not suggestive of acute stroke, tPA was not administered.  4. Hypocalcemia and elevated Cr   Recommendations: - MRI brain without contrast.   -- If no acute stroke on MRI, no further work-up necessary.   -- PRN medication for headache -- IV hydration -- Management of  hypocalcemia -- Case discussed with Dr. Stark Jock  I have seen and examined the patient. I have formulated the assessment and recommendations. 78 year old male presenting after episode of nausea, diffuse weakness and dysarthria at his SNF. NIHSS of 1 for dysarthria. Overall presentation does not militate in favor of stroke. tPA not indicated. Plan is for MRI brain and IV hydration.   Electronically signed: Dr. Kerney Elbe

## 2022-06-02 ENCOUNTER — Emergency Department (HOSPITAL_BASED_OUTPATIENT_CLINIC_OR_DEPARTMENT_OTHER): Payer: Medicare Other

## 2022-06-02 ENCOUNTER — Inpatient Hospital Stay (HOSPITAL_BASED_OUTPATIENT_CLINIC_OR_DEPARTMENT_OTHER)
Admission: EM | Admit: 2022-06-02 | Discharge: 2022-06-07 | DRG: 871 | Disposition: A | Discharge status: Nursing Home (Includes ICF) | Payer: Medicare Other | Source: Skilled Nursing Facility | Attending: Internal Medicine | Admitting: Internal Medicine

## 2022-06-02 ENCOUNTER — Encounter (HOSPITAL_BASED_OUTPATIENT_CLINIC_OR_DEPARTMENT_OTHER): Payer: Self-pay | Admitting: Emergency Medicine

## 2022-06-02 ENCOUNTER — Other Ambulatory Visit: Payer: Self-pay

## 2022-06-02 DIAGNOSIS — R6520 Severe sepsis without septic shock: Secondary | ICD-10-CM | POA: Diagnosis present

## 2022-06-02 DIAGNOSIS — A419 Sepsis, unspecified organism: Secondary | ICD-10-CM | POA: Diagnosis not present

## 2022-06-02 DIAGNOSIS — D649 Anemia, unspecified: Secondary | ICD-10-CM

## 2022-06-02 DIAGNOSIS — M1990 Unspecified osteoarthritis, unspecified site: Secondary | ICD-10-CM | POA: Diagnosis present

## 2022-06-02 DIAGNOSIS — Z681 Body mass index (BMI) 19 or less, adult: Secondary | ICD-10-CM

## 2022-06-02 DIAGNOSIS — I083 Combined rheumatic disorders of mitral, aortic and tricuspid valves: Secondary | ICD-10-CM | POA: Diagnosis present

## 2022-06-02 DIAGNOSIS — I119 Hypertensive heart disease without heart failure: Secondary | ICD-10-CM | POA: Diagnosis present

## 2022-06-02 DIAGNOSIS — Z79899 Other long term (current) drug therapy: Secondary | ICD-10-CM

## 2022-06-02 DIAGNOSIS — E872 Acidosis, unspecified: Secondary | ICD-10-CM | POA: Insufficient documentation

## 2022-06-02 DIAGNOSIS — F17210 Nicotine dependence, cigarettes, uncomplicated: Secondary | ICD-10-CM | POA: Diagnosis present

## 2022-06-02 DIAGNOSIS — Z7902 Long term (current) use of antithrombotics/antiplatelets: Secondary | ICD-10-CM

## 2022-06-02 DIAGNOSIS — E43 Unspecified severe protein-calorie malnutrition: Secondary | ICD-10-CM | POA: Insufficient documentation

## 2022-06-02 DIAGNOSIS — Z89432 Acquired absence of left foot: Secondary | ICD-10-CM

## 2022-06-02 DIAGNOSIS — I43 Cardiomyopathy in diseases classified elsewhere: Secondary | ICD-10-CM | POA: Diagnosis present

## 2022-06-02 DIAGNOSIS — N171 Acute kidney failure with acute cortical necrosis: Secondary | ICD-10-CM | POA: Diagnosis present

## 2022-06-02 DIAGNOSIS — I272 Pulmonary hypertension, unspecified: Secondary | ICD-10-CM | POA: Diagnosis present

## 2022-06-02 DIAGNOSIS — R001 Bradycardia, unspecified: Secondary | ICD-10-CM | POA: Diagnosis present

## 2022-06-02 DIAGNOSIS — N400 Enlarged prostate without lower urinary tract symptoms: Secondary | ICD-10-CM | POA: Insufficient documentation

## 2022-06-02 DIAGNOSIS — Z1152 Encounter for screening for COVID-19: Secondary | ICD-10-CM

## 2022-06-02 DIAGNOSIS — N179 Acute kidney failure, unspecified: Secondary | ICD-10-CM | POA: Diagnosis present

## 2022-06-02 DIAGNOSIS — I3139 Other pericardial effusion (noninflammatory): Secondary | ICD-10-CM | POA: Diagnosis present

## 2022-06-02 DIAGNOSIS — J449 Chronic obstructive pulmonary disease, unspecified: Secondary | ICD-10-CM | POA: Insufficient documentation

## 2022-06-02 DIAGNOSIS — N329 Bladder disorder, unspecified: Secondary | ICD-10-CM | POA: Diagnosis present

## 2022-06-02 DIAGNOSIS — I739 Peripheral vascular disease, unspecified: Secondary | ICD-10-CM | POA: Diagnosis present

## 2022-06-02 DIAGNOSIS — F0390 Unspecified dementia without behavioral disturbance: Secondary | ICD-10-CM | POA: Diagnosis present

## 2022-06-02 DIAGNOSIS — I10 Essential (primary) hypertension: Secondary | ICD-10-CM | POA: Diagnosis present

## 2022-06-02 DIAGNOSIS — E860 Dehydration: Secondary | ICD-10-CM | POA: Diagnosis present

## 2022-06-02 DIAGNOSIS — D638 Anemia in other chronic diseases classified elsewhere: Secondary | ICD-10-CM | POA: Diagnosis present

## 2022-06-02 DIAGNOSIS — F1011 Alcohol abuse, in remission: Secondary | ICD-10-CM | POA: Diagnosis present

## 2022-06-02 DIAGNOSIS — I959 Hypotension, unspecified: Secondary | ICD-10-CM | POA: Diagnosis not present

## 2022-06-02 DIAGNOSIS — D509 Iron deficiency anemia, unspecified: Secondary | ICD-10-CM | POA: Insufficient documentation

## 2022-06-02 DIAGNOSIS — N390 Urinary tract infection, site not specified: Secondary | ICD-10-CM | POA: Insufficient documentation

## 2022-06-02 DIAGNOSIS — N3289 Other specified disorders of bladder: Secondary | ICD-10-CM | POA: Insufficient documentation

## 2022-06-02 DIAGNOSIS — R627 Adult failure to thrive: Secondary | ICD-10-CM | POA: Diagnosis present

## 2022-06-02 DIAGNOSIS — Z8249 Family history of ischemic heart disease and other diseases of the circulatory system: Secondary | ICD-10-CM

## 2022-06-02 LAB — COMPREHENSIVE METABOLIC PANEL
ALT: <5 U/L (ref 0–44)
AST: 10 U/L — ABNORMAL LOW (ref 15–41)
Albumin: 3.9 g/dL (ref 3.5–5.0)
Alkaline Phosphatase: 42 U/L (ref 38–126)
Anion gap: 15 (ref 5–15)
BUN: 58 mg/dL — ABNORMAL HIGH (ref 8–23)
CO2: 19 mmol/L — ABNORMAL LOW (ref 22–32)
Calcium: 9.3 mg/dL (ref 8.9–10.3)
Chloride: 104 mmol/L (ref 98–111)
Creatinine, Ser: 3.63 mg/dL — ABNORMAL HIGH (ref 0.61–1.24)
GFR, Estimated: 16 mL/min — ABNORMAL LOW (ref >60)
Glucose, Bld: 108 mg/dL — ABNORMAL HIGH (ref 70–99)
Potassium: 3.8 mmol/L (ref 3.5–5.1)
Sodium: 138 mmol/L (ref 135–145)
Total Bilirubin: 0.6 mg/dL (ref 0.3–1.2)
Total Protein: 7.2 g/dL (ref 6.5–8.1)

## 2022-06-02 LAB — ETHANOL: Alcohol, Ethyl (B): <10 mg/dL (ref <10)

## 2022-06-02 LAB — CBC WITH DIFFERENTIAL/PLATELET
Abs Immature Granulocytes: 0 10*3/uL (ref 0.00–0.07)
Basophils Absolute: 0 10*3/uL (ref 0.0–0.1)
Basophils Relative: 0 %
Eosinophils Absolute: 0.1 10*3/uL (ref 0.0–0.5)
Eosinophils Relative: 1 %
HCT: 31.3 % — ABNORMAL LOW (ref 39.0–52.0)
Hemoglobin: 9.7 g/dL — ABNORMAL LOW (ref 13.0–17.0)
Lymphocytes Relative: 50 %
Lymphs Abs: 2.7 10*3/uL (ref 0.7–4.0)
MCH: 21.9 pg — ABNORMAL LOW (ref 26.0–34.0)
MCHC: 31 g/dL (ref 30.0–36.0)
MCV: 70.8 fL — ABNORMAL LOW (ref 80.0–100.0)
Monocytes Absolute: 0.3 10*3/uL (ref 0.1–1.0)
Monocytes Relative: 6 %
Neutro Abs: 2.3 10*3/uL (ref 1.7–7.7)
Neutrophils Relative %: 43 %
Platelets: 348 10*3/uL (ref 150–400)
RBC: 4.42 MIL/uL (ref 4.22–5.81)
RDW: 21.5 % — ABNORMAL HIGH (ref 11.5–15.5)
WBC: 5.4 10*3/uL (ref 4.0–10.5)
nRBC: 0 % (ref 0.0–0.2)

## 2022-06-02 LAB — RESP PANEL BY RT-PCR (RSV, FLU A&B, COVID)  RVPGX2
Influenza A by PCR: NEGATIVE
Influenza B by PCR: NEGATIVE
Resp Syncytial Virus by PCR: NEGATIVE
SARS Coronavirus 2 by RT PCR: NEGATIVE

## 2022-06-02 LAB — PROTIME-INR
INR: 1.1 (ref 0.8–1.2)
Prothrombin Time: 14.3 seconds (ref 11.4–15.2)

## 2022-06-02 LAB — LACTIC ACID, PLASMA: Lactic Acid, Venous: 3.4 mmol/L (ref 0.5–1.9)

## 2022-06-02 LAB — OCCULT BLOOD X 1 CARD TO LAB, STOOL: Fecal Occult Bld: NEGATIVE

## 2022-06-02 LAB — APTT: aPTT: 20 seconds — ABNORMAL LOW (ref 24–36)

## 2022-06-02 MED ORDER — METRONIDAZOLE 500 MG/100ML IV SOLN
500.0000 mg | Freq: Once | INTRAVENOUS | Status: AC
  Administered 2022-06-02: 500 mg via INTRAVENOUS
  Filled 2022-06-02: qty 100

## 2022-06-02 MED ORDER — LACTATED RINGERS IV BOLUS (SEPSIS)
1000.0000 mL | Freq: Once | INTRAVENOUS | Status: AC
  Administered 2022-06-02: 1000 mL via INTRAVENOUS

## 2022-06-02 MED ORDER — LACTATED RINGERS IV SOLN: INTRAVENOUS | Status: DC

## 2022-06-02 MED ORDER — VANCOMYCIN VARIABLE DOSE PER UNSTABLE RENAL FUNCTION (PHARMACIST DOSING)
Status: DC
  Filled 2022-06-02: qty 1

## 2022-06-02 MED ORDER — VANCOMYCIN HCL IN DEXTROSE 1-5 GM/200ML-% IV SOLN
1000.0000 mg | Freq: Once | INTRAVENOUS | Status: AC
  Administered 2022-06-03: 1000 mg via INTRAVENOUS
  Filled 2022-06-02: qty 200

## 2022-06-02 MED ORDER — LACTATED RINGERS IV BOLUS (SEPSIS)
500.0000 mL | Freq: Once | INTRAVENOUS | Status: AC
  Administered 2022-06-02: 500 mL via INTRAVENOUS

## 2022-06-02 MED ORDER — LACTATED RINGERS IV BOLUS (SEPSIS)
250.0000 mL | Freq: Once | INTRAVENOUS | Status: AC
  Administered 2022-06-02: 250 mL via INTRAVENOUS

## 2022-06-02 MED ORDER — SODIUM CHLORIDE 0.9 % IV SOLN: 2.0000 g | INTRAVENOUS | Status: DC

## 2022-06-02 MED ORDER — SODIUM CHLORIDE 0.9 % IV SOLN
2.0000 g | Freq: Once | INTRAVENOUS | Status: AC
  Administered 2022-06-02: 2 g via INTRAVENOUS
  Filled 2022-06-02: qty 12.5

## 2022-06-02 NOTE — ED Provider Notes (Signed)
[facility] Provider Note   CSN: [id number] Arrival date & time: 06/02/22  1943     History  Chief Complaint  Patient presents with   Failure To Thrive   Anorexia    Leon Foster is a 82 y.o. male.  HPI   Patient presents to the ED for evaluation of decreased p.o. intake.  Patient lives at an assisted living facility.  According to the family he has not been eating well for the last several weeks.  Family have also noticed he has been losing weight.  Patient denies any complaints.  He is not having any chest pain or belly pain.  He denies any abdominal pain.  No known fevers.  Patient does have a history of smoking and prior alcohol use.  Patient reportedly has not been drinking since being in the assisted living facility  Home Medications Prior to Admission medications   Medication Sig Start Date End Date Taking? Authorizing Provider  acamprosate (CAMPRAL) 333 MG tablet Take 333 mg by mouth 3 (three) times daily with meals.    [provider]  acetaminophen (TYLENOL) 325 MG tablet Take 650 mg by mouth every 6 (six) hours as needed for headache.    [provider]  clopidogrel (PLAVIX) 75 MG tablet Take 1 tablet (75 mg total) by mouth daily with breakfast. 12/15/14   Bonnielee Haff, MD  donepezil (ARICEPT) 10 MG tablet Take 10 mg by mouth at bedtime.    [provider]  metoprolol tartrate (LOPRESSOR) 25 MG tablet Take 0.5 tablets (12.5 mg total) by mouth 2 (two) times daily. Patient taking differently: Take 12.5 mg by mouth daily.  12/15/14   Bonnielee Haff, MD  Multiple Vitamin (MULTIVITAMIN WITH MINERALS) TABS tablet Take 1 tablet by mouth daily. 12/15/14   Bonnielee Haff, MD  naltrexone (DEPADE) 50 MG tablet Take 50 mg by mouth 2 (two) times a day.    [provider]      Allergies    Patient has no known allergies.    Review of Systems   Review of Systems  Physical Exam Updated Vital  Signs BP 104/66 (BP Location: Right Arm)   Pulse (!) 104   Temp 97.7 F (36.5 C) (Oral)   Resp (!) 24   SpO2 98%  Physical Exam  ED Results / Procedures / Treatments   Labs (all labs ordered are listed, but only abnormal results are displayed) Labs Reviewed  LACTIC ACID, PLASMA - Abnormal; Notable for the following components:      Result Value   Lactic Acid, Venous 3.4 (*)    All other components within normal limits  COMPREHENSIVE METABOLIC PANEL - Abnormal; Notable for the following components:   CO2 19 (*)    Glucose, Bld 108 (*)    BUN 58 (*)    Creatinine, Ser 3.63 (*)    AST 10 (*)    GFR, Estimated 16 (*)    All other components within normal limits  CBC WITH DIFFERENTIAL/PLATELET - Abnormal; Notable for the following components:   Hemoglobin 9.7 (*)    HCT 31.3 (*)    MCV 70.8 (*)    MCH 21.9 (*)    RDW 21.5 (*)    All other components within normal limits  APTT - Abnormal; Notable for the following components:   aPTT 20 (*)    All other components within normal limits  RESP PANEL BY RT-PCR (RSV, FLU A&B, COVID)  RVPGX2  CULTURE,  BLOOD (ROUTINE X 2)  CULTURE, BLOOD (ROUTINE X 2)  PROTIME-INR  ETHANOL  OCCULT BLOOD X 1 CARD TO LAB, STOOL  LACTIC ACID, PLASMA  URINALYSIS, W/ REFLEX TO CULTURE (INFECTION SUSPECTED)    EKG EKG Interpretation  Date/Time:  Friday June 02 2022 20:12:39 EST Ventricular Rate:  77 PR Interval:  132 QRS Duration: 93 QT Interval:  380 QTC Calculation: 430 R Axis:   56 Text Interpretation: Sinus rhythm Borderline low voltage, extremity leads No significant change since last tracing Confirmed by Dorie Rank [phone] on 06/02/2022 8:22:34 PM  Radiology CT ABDOMEN PELVIS WO CONTRAST  Result Date: 06/02/2022 CLINICAL DATA:  Abdomen pain decreased appetite EXAM: CT ABDOMEN AND PELVIS WITHOUT CONTRAST TECHNIQUE: Multidetector CT imaging of the abdomen and pelvis was performed following the standard protocol without IV contrast.  RADIATION DOSE REDUCTION: This exam was performed according to the departmental dose-optimization program which includes automated exposure control, adjustment of the mA and/or kV according to patient size and/or use of iterative reconstruction technique. COMPARISON:  CT 07/11/2005 FINDINGS: Lower chest: Lung bases demonstrate emphysema. Mild bronchiectasis and scarring in the left lung base. Hepatobiliary: No calcified gallstone or biliary dilatation. No gross focal hepatic abnormality without contrast Pancreas: Negative for inflammation Spleen: Grossly negative Adrenals/Urinary Tract: Adrenal glands are within normal limits. Kidneys show no hydronephrosis. Slightly thick-walled appearance of the bladder. 14 mm stone within the right posterior bladder. Pedunculated mass measuring 5.3 by 3.7 cm, possibly contiguous with prostate. Stomach/Bowel: Limited assessment due to absence of contrast, and cachectic-appearing patient with lack of intra-abdominal fat and poor contrast. No gross bowel distension or bowel wall thickening. Fluid-filled nondilated bowel in the abdomen and pelvis Vascular/Lymphatic: Advanced aortic atherosclerosis. No aneurysm. No grossly enlarged lymph nodes. Reproductive: Enlarged prostate possibly contiguous with a pedunculated bladder mass. Other: Negative for pelvic effusion or free air. Musculoskeletal: Age indeterminate moderate superior endplate compression fractures at T12 and L1. IMPRESSION: 1. Markedly limited exam secondary to absence of intra-abdominal fat/cachectic-appearing patient and absence of oral or intravenous contrast. 2. Slightly thick-walled appearance of the urinary bladder, possible chronic obstruction versus cystitis. Large 5.3 cm pedunculated mass within the bladder lumen, possibly contiguous with enlarged prostate. Recommend direct visualization. 14 mm bladder stone 3. Emphysema Electronically Signed   By: Donavan Foil M.D.   On: 06/02/2022 23:29   DG Chest Port 1  View  Result Date: 06/02/2022 CLINICAL DATA:  Possible sepsis.  Weight loss. EXAM: PORTABLE CHEST 1 VIEW COMPARISON:  09/17/2018. FINDINGS: The heart size and mediastinal contours are within normal limits. There is atherosclerotic calcification of the lungs. Hyperinflation of the lungs is noted bilaterally. No consolidation, effusion, or pneumothorax. Bilateral nipple shadows are noted and seen on previous exams. No acute osseous abnormality. IMPRESSION: 1. No active disease. 2. Hyperinflation of the lungs. Electronically Signed   By: Brett Fairy M.D.   On: 06/02/2022 20:38    Procedures Procedures    Medications Ordered in ED Medications  lactated ringers infusion ( Intravenous New Bag/Given 06/02/22 2356)  vancomycin (VANCOCIN) IVPB 1000 mg/200 mL premix (has no administration in time range)  vancomycin variable dose per unstable renal function (pharmacist dosing) (has no administration in time range)  ceFEPIme (MAXIPIME) 2 g in sodium chloride 0.9 % 100 mL IVPB (has no administration in time range)  lactated ringers bolus 1,000 mL (1,000 mLs Intravenous New Bag/Given 06/02/22 2125)    And  lactated ringers bolus 500 mL (500 mLs Intravenous New Bag/Given 06/02/22 2251)    And  lactated ringers bolus 250 mL (250 mLs Intravenous New Bag/Given 06/02/22 2340)  ceFEPIme (MAXIPIME) 2 g in sodium chloride 0.9 % 100 mL IVPB (0 g Intravenous Stopped 06/02/22 2207)  metroNIDAZOLE (FLAGYL) IVPB 500 mg (500 mg Intravenous New Bag/Given 06/02/22 2254)    ED Course/ Medical Decision Making/ A&P Clinical Course as of 06/02/22 2358  Fri Jun 02, 2022  2057 CBC with Differential(!) CBC shows a anemia.  Hemoglobin decreased from several years ago [JK]  2136 Comprehensive metabolic panel(!) Labs are consistent with acute renal failure.  Elevated BUN and creatinine. [JK]  2251 Chest x-ray without acute abnormality.  COVID flu negative. [JK]  2318 Blood pressure is improved to the 100s with IV fluid  hydration.  Oxygen saturation documented is not accurate as we do not have a good waveform [JK]  2332 CT scan of abdomen shows thickened bladder.  Mass noted within bladder lumen [JK]    Clinical Course User Index [JK] Dorie Rank, MD                             Medical Decision Making Problems Addressed: AKI (acute kidney injury) [facility]): acute illness or injury that poses a threat to life or bodily functions Anemia, unspecified type: undiagnosed new problem with uncertain prognosis Bladder mass: acute illness or injury that poses a threat to life or bodily functions Dehydration: acute illness or injury that poses a threat to life or bodily functions  Amount and/or Complexity of Data Reviewed Labs: ordered. Decision-making details documented in ED Course. Radiology: ordered and independent interpretation performed. ECG/medicine tests: ordered.  Risk Prescription drug management.   Patient presented to the ED for evaluation of decreased p.o. intake.  FamilyDeniedAny fevers or chills.  No nausea vomiting or diarrhea.  Patient notably hypotensive on arrival.  Sepsis protocol initiated.  Patient was started on IV fluids and antibiotics.  Blood pressure improved with IV hydration.  Patient also noted to have decreased oxygen saturation on that we did not have a good waveform and he denied any respiratory difficulty.  Patient however is a chronic smoker and certainly could have low oxygen level at baseline.  ED workup was notable for acute kidney injury with elevated BUN and creatinine.  Anemia also noted but stools are guaiac negative and no signs of active bleeding.  No evidence of pneumonia.  COVID flu negative.  No signs of acute infection in his abdomen other than the possibility of a cystitis.  Urinalysis has been ordered but we are still waiting for the patient to provide a sample.  Possibly has UTI.  There is also possible bladder mass.  His presentation is concerning the possibility of an  occult malignancy with his weight loss and anorexia.  Patient will continue on IV antibiotics.  Will continue with IV fluid hydration.  I will consult with admission to the hospital.  Likely would benefit from neurology consultation as well.        Final Clinical Impression(s) / ED Diagnoses Final diagnoses:  AKI (acute kidney injury) ([place])  Dehydration  Anemia, unspecified type  Bladder mass    Rx / DC Orders ED Discharge Orders     None         Dorie Rank, MD 06/02/22 2358

## 2022-06-02 NOTE — ED Triage Notes (Signed)
From assisted living Hasn't been eating for last 3 weeks. Denies n/v/d Weight loss noted by family/sister

## 2022-06-02 NOTE — ED Notes (Addendum)
Critical lab: Lactic acid 3.4. Dr. Tomi Bamberger notified.

## 2022-06-02 NOTE — Progress Notes (Signed)
Pt being followed by [facility] for Sepsis protocol. 

## 2022-06-02 NOTE — ED Notes (Signed)
Pt placed on [place] 2 Lpm for desaturation to the 80's. Pt respiratory status stable w/no distress noted at this time.

## 2022-06-02 NOTE — Progress Notes (Signed)
Pharmacy Antibiotic Note  Leon Foster is a 82 y.o. male admitted on 06/02/2022 presenting with decreased PO intake, concern for sepsis.  Pharmacy has been consulted for cefepime and vancomycin dosing.  Plan: Vancomycin 1g IV x 1, then variable dosing d/t unstable renal function Cefepime 2g IV every 24 hours Monitor renal function, Cx and clinical progression to narrow     Temp (24hrs), Avg:97.7 F (36.5 C), Min:97.7 F (36.5 C), Max:97.7 F (36.5 C)  Recent Labs  Lab 06/02/22 2021  WBC 5.4  CREATININE 3.63*    CrCl cannot be calculated (Unknown ideal weight.).    No Known Allergies  Bertis Ruddy, PharmD, [facility] Clinical Pharmacist ED Pharmacist Phone # [phone] 06/02/2022 9:31 PM

## 2022-06-02 NOTE — ED Notes (Signed)
Pt calls out with reports of right sided neck pain.  Gave pt a warm pack, checked throat which appears WNL

## 2022-06-03 ENCOUNTER — Inpatient Hospital Stay: Payer: Self-pay

## 2022-06-03 ENCOUNTER — Encounter (HOSPITAL_COMMUNITY): Payer: Self-pay | Admitting: Internal Medicine

## 2022-06-03 DIAGNOSIS — R9431 Abnormal electrocardiogram [ECG] [EKG]: Secondary | ICD-10-CM | POA: Diagnosis not present

## 2022-06-03 DIAGNOSIS — Z89432 Acquired absence of left foot: Secondary | ICD-10-CM | POA: Diagnosis not present

## 2022-06-03 DIAGNOSIS — F17210 Nicotine dependence, cigarettes, uncomplicated: Secondary | ICD-10-CM | POA: Diagnosis present

## 2022-06-03 DIAGNOSIS — D638 Anemia in other chronic diseases classified elsewhere: Secondary | ICD-10-CM

## 2022-06-03 DIAGNOSIS — N171 Acute kidney failure with acute cortical necrosis: Secondary | ICD-10-CM | POA: Diagnosis present

## 2022-06-03 DIAGNOSIS — F0390 Unspecified dementia without behavioral disturbance: Secondary | ICD-10-CM | POA: Diagnosis present

## 2022-06-03 DIAGNOSIS — I3139 Other pericardial effusion (noninflammatory): Secondary | ICD-10-CM | POA: Diagnosis present

## 2022-06-03 DIAGNOSIS — N390 Urinary tract infection, site not specified: Secondary | ICD-10-CM | POA: Diagnosis present

## 2022-06-03 DIAGNOSIS — D509 Iron deficiency anemia, unspecified: Secondary | ICD-10-CM | POA: Diagnosis present

## 2022-06-03 DIAGNOSIS — E872 Acidosis, unspecified: Secondary | ICD-10-CM

## 2022-06-03 DIAGNOSIS — I272 Pulmonary hypertension, unspecified: Secondary | ICD-10-CM | POA: Diagnosis present

## 2022-06-03 DIAGNOSIS — A419 Sepsis, unspecified organism: Secondary | ICD-10-CM | POA: Diagnosis present

## 2022-06-03 DIAGNOSIS — N329 Bladder disorder, unspecified: Secondary | ICD-10-CM | POA: Diagnosis present

## 2022-06-03 DIAGNOSIS — I739 Peripheral vascular disease, unspecified: Secondary | ICD-10-CM | POA: Diagnosis present

## 2022-06-03 DIAGNOSIS — Z681 Body mass index (BMI) 19 or less, adult: Secondary | ICD-10-CM | POA: Diagnosis not present

## 2022-06-03 DIAGNOSIS — Z1152 Encounter for screening for COVID-19: Secondary | ICD-10-CM | POA: Diagnosis not present

## 2022-06-03 DIAGNOSIS — I083 Combined rheumatic disorders of mitral, aortic and tricuspid valves: Secondary | ICD-10-CM | POA: Diagnosis present

## 2022-06-03 DIAGNOSIS — E860 Dehydration: Secondary | ICD-10-CM | POA: Diagnosis present

## 2022-06-03 DIAGNOSIS — E43 Unspecified severe protein-calorie malnutrition: Secondary | ICD-10-CM | POA: Diagnosis present

## 2022-06-03 DIAGNOSIS — I43 Cardiomyopathy in diseases classified elsewhere: Secondary | ICD-10-CM | POA: Diagnosis present

## 2022-06-03 DIAGNOSIS — R627 Adult failure to thrive: Secondary | ICD-10-CM | POA: Diagnosis present

## 2022-06-03 DIAGNOSIS — J449 Chronic obstructive pulmonary disease, unspecified: Secondary | ICD-10-CM | POA: Diagnosis present

## 2022-06-03 DIAGNOSIS — R6520 Severe sepsis without septic shock: Secondary | ICD-10-CM | POA: Diagnosis present

## 2022-06-03 DIAGNOSIS — N3289 Other specified disorders of bladder: Secondary | ICD-10-CM | POA: Insufficient documentation

## 2022-06-03 DIAGNOSIS — I119 Hypertensive heart disease without heart failure: Secondary | ICD-10-CM | POA: Diagnosis present

## 2022-06-03 DIAGNOSIS — N179 Acute kidney failure, unspecified: Secondary | ICD-10-CM

## 2022-06-03 DIAGNOSIS — N400 Enlarged prostate without lower urinary tract symptoms: Secondary | ICD-10-CM | POA: Diagnosis present

## 2022-06-03 DIAGNOSIS — I959 Hypotension, unspecified: Secondary | ICD-10-CM | POA: Diagnosis present

## 2022-06-03 LAB — LACTIC ACID, PLASMA
Lactic Acid, Venous: 8.7 mmol/L (ref 0.5–1.9)
Lactic Acid, Venous: 1.8 mmol/L (ref 0.5–1.9)
Lactic Acid, Venous: 1.8 mmol/L (ref 0.5–1.9)

## 2022-06-03 LAB — CBC WITH DIFFERENTIAL/PLATELET
Abs Immature Granulocytes: 0.02 10*3/uL (ref 0.00–0.07)
Basophils Absolute: 0.1 10*3/uL (ref 0.0–0.1)
Basophils Relative: 1 %
Eosinophils Absolute: 0.1 10*3/uL (ref 0.0–0.5)
Eosinophils Relative: 2 %
HCT: 26.8 % — ABNORMAL LOW (ref 39.0–52.0)
Hemoglobin: 8.1 g/dL — ABNORMAL LOW (ref 13.0–17.0)
Immature Granulocytes: 0 %
Lymphocytes Relative: 22 %
Lymphs Abs: 1.3 10*3/uL (ref 0.7–4.0)
MCH: 21.8 pg — ABNORMAL LOW (ref 26.0–34.0)
MCHC: 30.2 g/dL (ref 30.0–36.0)
MCV: 72.2 fL — ABNORMAL LOW (ref 80.0–100.0)
Monocytes Absolute: 0.3 10*3/uL (ref 0.1–1.0)
Monocytes Relative: 5 %
Neutro Abs: 4.1 10*3/uL (ref 1.7–7.7)
Neutrophils Relative %: 70 %
Platelets: 283 10*3/uL (ref 150–400)
RBC: 3.71 MIL/uL — ABNORMAL LOW (ref 4.22–5.81)
RDW: 22 % — ABNORMAL HIGH (ref 11.5–15.5)
WBC: 5.9 10*3/uL (ref 4.0–10.5)
nRBC: 0 % (ref 0.0–0.2)

## 2022-06-03 LAB — FERRITIN: Ferritin: 5 ng/mL — ABNORMAL LOW (ref 24–336)

## 2022-06-03 LAB — IRON AND TIBC
Iron: 21 ug/dL — ABNORMAL LOW (ref 45–182)
Saturation Ratios: 7 % — ABNORMAL LOW (ref 17.9–39.5)
TIBC: 312 ug/dL (ref 250–450)
UIBC: 291 ug/dL

## 2022-06-03 LAB — URINALYSIS, W/ REFLEX TO CULTURE (INFECTION SUSPECTED)
Bilirubin Urine: NEGATIVE
Glucose, UA: NEGATIVE mg/dL
Ketones, ur: NEGATIVE mg/dL
Nitrite: POSITIVE — AB
Protein, ur: 100 mg/dL — AB
Specific Gravity, Urine: 1.02 (ref 1.005–1.030)
WBC, UA: >50 WBC/hpf (ref 0–5)
pH: 5 (ref 5.0–8.0)

## 2022-06-03 LAB — HEPATIC FUNCTION PANEL
ALT: 6 U/L (ref 0–44)
AST: 12 U/L — ABNORMAL LOW (ref 15–41)
Albumin: 2.8 g/dL — ABNORMAL LOW (ref 3.5–5.0)
Alkaline Phosphatase: 35 U/L — ABNORMAL LOW (ref 38–126)
Bilirubin, Direct: 0.1 mg/dL (ref 0.0–0.2)
Indirect Bilirubin: 0.7 mg/dL (ref 0.3–0.9)
Total Bilirubin: 0.8 mg/dL (ref 0.3–1.2)
Total Protein: 5.5 g/dL — ABNORMAL LOW (ref 6.5–8.1)

## 2022-06-03 LAB — TYPE AND SCREEN
ABO/RH(D): A POS
Antibody Screen: NEGATIVE

## 2022-06-03 LAB — BASIC METABOLIC PANEL
Anion gap: 9 (ref 5–15)
BUN: 51 mg/dL — ABNORMAL HIGH (ref 8–23)
CO2: 23 mmol/L (ref 22–32)
Calcium: 8.1 mg/dL — ABNORMAL LOW (ref 8.9–10.3)
Chloride: 105 mmol/L (ref 98–111)
Creatinine, Ser: 2.86 mg/dL — ABNORMAL HIGH (ref 0.61–1.24)
GFR, Estimated: 22 mL/min — ABNORMAL LOW (ref >60)
Glucose, Bld: 83 mg/dL (ref 70–99)
Potassium: 4.2 mmol/L (ref 3.5–5.1)
Sodium: 137 mmol/L (ref 135–145)

## 2022-06-03 LAB — TSH: TSH: 0.572 u[IU]/mL (ref 0.350–4.500)

## 2022-06-03 LAB — VITAMIN B12: Vitamin B-12: 363 pg/mL (ref 180–914)

## 2022-06-03 LAB — MRSA NEXT GEN BY PCR, NASAL: MRSA by PCR Next Gen: NOT DETECTED

## 2022-06-03 LAB — FOLATE: Folate: 6.1 ng/mL (ref >5.9)

## 2022-06-03 LAB — TROPONIN I (HIGH SENSITIVITY)
Troponin I (High Sensitivity): 12 ng/L (ref <18)
Troponin I (High Sensitivity): 16 ng/L (ref <18)

## 2022-06-03 LAB — MAGNESIUM: Magnesium: 2.3 mg/dL (ref 1.7–2.4)

## 2022-06-03 MED ORDER — LACTATED RINGERS IV BOLUS
1000.0000 mL | Freq: Once | INTRAVENOUS | Status: AC
  Administered 2022-06-03: 1000 mL via INTRAVENOUS

## 2022-06-03 MED ORDER — ACETAMINOPHEN 325 MG PO TABS: 650.0000 mg | ORAL_TABLET | Freq: Four times a day (QID) | ORAL | Status: DC | PRN

## 2022-06-03 MED ORDER — ORAL CARE MOUTH RINSE: 15.0000 mL | OROMUCOSAL | Status: DC | PRN

## 2022-06-03 MED ORDER — DONEPEZIL HCL 10 MG PO TABS
10.0000 mg | ORAL_TABLET | Freq: Every day | ORAL | Status: DC
  Administered 2022-06-03 – 2022-06-06 (×4): 10 mg via ORAL
  Filled 2022-06-03 (×4): qty 1

## 2022-06-03 MED ORDER — SODIUM CHLORIDE 0.9% FLUSH
10.0000 mL | Freq: Two times a day (BID) | INTRAVENOUS | Status: DC
  Administered 2022-06-03 – 2022-06-07 (×6): 10 mL

## 2022-06-03 MED ORDER — SODIUM CHLORIDE 0.9 % IV SOLN
1.0000 g | INTRAVENOUS | Status: AC
  Administered 2022-06-03 – 2022-06-05 (×3): 1 g via INTRAVENOUS
  Filled 2022-06-03 (×3): qty 10

## 2022-06-03 MED ORDER — SODIUM CHLORIDE 0.9 % IV BOLUS
500.0000 mL | Freq: Once | INTRAVENOUS | Status: AC
  Administered 2022-06-03: 500 mL via INTRAVENOUS

## 2022-06-03 MED ORDER — ORAL CARE MOUTH RINSE
15.0000 mL | OROMUCOSAL | Status: DC
  Administered 2022-06-03 – 2022-06-07 (×10): 15 mL via OROMUCOSAL

## 2022-06-03 MED ORDER — ACAMPROSATE CALCIUM 333 MG PO TBEC
333.0000 mg | DELAYED_RELEASE_TABLET | Freq: Three times a day (TID) | ORAL | Status: DC
  Administered 2022-06-03 – 2022-06-07 (×13): 333 mg via ORAL
  Filled 2022-06-03 (×15): qty 1

## 2022-06-03 MED ORDER — SODIUM CHLORIDE 0.9% FLUSH: 10.0000 mL | INTRAVENOUS | Status: DC | PRN

## 2022-06-03 MED ORDER — ACETAMINOPHEN 650 MG RE SUPP: 650.0000 mg | Freq: Four times a day (QID) | RECTAL | Status: DC | PRN

## 2022-06-03 MED ORDER — NALTREXONE HCL 50 MG PO TABS
50.0000 mg | ORAL_TABLET | Freq: Every day | ORAL | Status: DC
  Administered 2022-06-03 – 2022-06-07 (×5): 50 mg via ORAL
  Filled 2022-06-03 (×5): qty 1

## 2022-06-03 MED ORDER — SODIUM CHLORIDE 0.9 % IV SOLN: INTRAVENOUS | Status: AC

## 2022-06-03 MED ORDER — CHLORHEXIDINE GLUCONATE CLOTH 2 % EX PADS
6.0000 | MEDICATED_PAD | Freq: Every day | CUTANEOUS | Status: DC
  Administered 2022-06-03 – 2022-06-07 (×5): 6 via TOPICAL

## 2022-06-03 NOTE — Progress Notes (Signed)
Patient had poor PO intake times several weeks

## 2022-06-03 NOTE — Progress Notes (Signed)
Charge nurse counted money out with patient, documented all bills, placed in envelope and sealed in front of patient and this nurse, patient signed that money and wallet was being taken to security to be locked up in safe.

## 2022-06-03 NOTE — ED Notes (Signed)
Unable to obtain 2nd blood cultures after myself and other RN had attempts in collecting blood. Went forward on antibiotics therefor no cultures were collected.

## 2022-06-03 NOTE — ED Notes (Signed)
Report given to Maudie Mercury, RN for 2W 1222.

## 2022-06-03 NOTE — H&P (Addendum)
History and Physical    Leon Foster [id number] DOB: 18-Jun-1940 DOA: 06/02/2022  Patient coming from: Assisted living facility.  Chief Complaint: Not eating well.   Most of the history was obtained from the ER physician and the accepting physician.  Unable to reach family.  Patient is not able to provide much history.  HPI: Leon Foster is a 82 y.o. male with history of peripheral arterial disease, COPD, alcohol abuse, prior history of AV malformation of the colon was brought to the ER after patient was found to be not eating well as seen by the family.  Not much history is of available.  ED Course: In the ER patient was found to be hypotensive with a systolic in the [id number] lactic acid was elevated at 3.  Labs also showed creatinine of 3.6 which increased from 1.2 about 3 years ago on 7/20.  Hemoglobin has dropped by 4 g from previous of 14 and is 9.7.  Stool for occult blood was negative.  Chest x-ray unremarkable CT abdomen pelvis done shows bladder mass.  Patient was placed on fluids empiric antibiotics admitted for further management.  At the time of my exam after patient reached [facility], patient is not hypotensive afebrile and not in distress.  Review of Systems: As per HPI, rest all negative.   Past Medical History:  Diagnosis Date   Acute renal failure ([place]) 11/2014   from recent hospitalization notes   Alcoholic ([place])    Arthritis    osteoarthritis   Atrial fibrillation ([place])    AVM (arteriovenous malformation) of colon    colonoscopy 2011 [facility]    Cardiomyopathy ([place])    from recent hospitalization notes   Confusion    COPD (chronic obstructive pulmonary disease) ([place])    Dementia ([place])    Gangrene of digit    left toe   Hypertension    Leukocytosis    PAD (peripheral artery disease) ([place])    Pulmonary hypertension ([facility])    Seizures ([place])     1 time seizure on 12/28/14 - seen in ED   Tobacco dependence     Past Surgical History:  Procedure  Laterality Date   AMPUTATION Left 01/01/2015   Procedure: Left Transmetatarsal Amputation;  Surgeon: Newt Minion, MD;  Location: [facility];  Service: Orthopedics;  Laterality: Left;   LOWER EXTREMITY ANGIOGRAM Bilateral 12/14/2014   Procedure: Lower Extremity Angiogram;  Surgeon: Elam Dutch, MD;  Location: [facility];  Service: Cardiovascular;  Laterality: Bilateral;   PERIPHERAL VASCULAR CATHETERIZATION N/A 12/14/2014   Procedure: Abdominal Aortogram;  Surgeon: Elam Dutch, MD;  Location: [facility];  Service: Cardiovascular;  Laterality: N/A;   PERIPHERAL VASCULAR CATHETERIZATION Left 12/14/2014   Procedure: Peripheral Vascular Intervention;  Surgeon: Elam Dutch, MD;  Location: [facility];  Service: Cardiovascular;  Laterality: Left;  SFA     reports that he has been smoking cigarettes. He has never used smokeless tobacco. He reports that he does not drink alcohol and does not use drugs.  No Known Allergies  Family History  Problem Relation Age of Onset   Stroke Sister    Deep vein thrombosis Sister    Cancer Brother    Diabetes Brother    Hyperlipidemia Brother    Hypertension Brother    Heart disease Father    Hypertension Sister     Prior to Admission medications   Medication Sig Start Date End Date Taking? Authorizing Provider  acamprosate (CAMPRAL) 333 MG tablet Take 333 mg by mouth 3 (three) times daily with meals.   Yes [provider]  clopidogrel (PLAVIX) 75 MG tablet Take 1 tablet (75 mg total) by mouth daily with breakfast. 12/15/14  Yes Bonnielee Haff, MD  donepezil (ARICEPT) 10 MG tablet Take 10 mg by mouth at bedtime.   Yes [provider]  metoprolol tartrate (LOPRESSOR) 25 MG tablet Take 0.5 tablets (12.5 mg total) by mouth 2 (two) times daily. Patient taking differently: Take 12.5 mg by mouth daily. 12/15/14  Yes Bonnielee Haff, MD  Multiple Vitamin (MULTIVITAMIN WITH MINERALS) TABS tablet Take 1 tablet by mouth  daily. 12/15/14  Yes Bonnielee Haff, MD  naltrexone (DEPADE) 50 MG tablet Take 50 mg by mouth 2 (two) times a day.   Yes [provider]  acetaminophen (TYLENOL) 325 MG tablet Take 650 mg by mouth every 6 (six) hours as needed for headache.    [provider]    Physical Exam: Constitutional: Moderately built and nourished. Vitals:   06/03/22 0200 06/03/22 0300 06/03/22 0400 06/03/22 0500  BP:  (!) 159/143 119/70 (!) 106/51  Pulse:  (!) 54 (!) 51   Resp: 18 20 15 12  $ Temp: 97.6 F (36.4 C)     TempSrc: Oral     SpO2: 100% 100% 100% 100%  Weight: 44.9 kg     Height: 5' 7.01" (1.702 m)      Eyes: Anicteric no pallor. ENMT: No discharge from the ears eyes nose and mouth. Neck: No mass felt.  No neck rigidity. Respiratory: No rhonchi or crepitations. Cardiovascular: S1-S2 heard. Abdomen: Soft nontender bowel sound present. Musculoskeletal: No edema.  Left transmetatarsal amputation. Skin: Chronic changes. Neurologic: Alert awake oriented to his name.  Moving all extremities. Psychiatric: Oriented to his name.   Labs on Admission: I have personally reviewed following labs and imaging studies  CBC: Recent Labs  Lab 06/02/22 2021  WBC 5.4  NEUTROABS 2.3  HGB 9.7*  HCT 31.3*  MCV 70.8*  PLT 0000000   Basic Metabolic Panel: Recent Labs  Lab 06/02/22 2021  NA 138  K 3.8  CL 104  CO2 19*  GLUCOSE 108*  BUN 58*  CREATININE 3.63*  CALCIUM 9.3   GFR: Estimated Creatinine Clearance: 10.3 mL/min (A) (by C-G formula based on SCr of 3.63 mg/dL (H)). Liver Function Tests: Recent Labs  Lab 06/02/22 2021  AST 10*  ALT <5  ALKPHOS 42  BILITOT 0.6  PROT 7.2  ALBUMIN 3.9   No results for input(s): "LIPASE", "AMYLASE" in the last 168 hours. No results for input(s): "AMMONIA" in the last 168 hours. Coagulation Profile: Recent Labs  Lab 06/02/22 2110  INR 1.1   Cardiac Enzymes: No results for input(s): "CKTOTAL", "CKMB", "CKMBINDEX", "TROPONINI" in  the last 168 hours. BNP (last 3 results) No results for input(s): "PROBNP" in the last 8760 hours. HbA1C: No results for input(s): "HGBA1C" in the last 72 hours. CBG: No results for input(s): "GLUCAP" in the last 168 hours. Lipid Profile: No results for input(s): "CHOL", "HDL", "LDLCALC", "TRIG", "CHOLHDL", "LDLDIRECT" in the last 72 hours. Thyroid Function Tests: No results for input(s): "TSH", "T4TOTAL", "FREET4", "T3FREE", "THYROIDAB" in the last 72 hours. Anemia Panel: No results for input(s): "VITAMINB12", "FOLATE", "FERRITIN", "TIBC", "IRON", "RETICCTPCT" in the last 72 hours. Urine analysis:    Component Value Date/Time   COLORURINE AMBER (A) 09/17/2018 1842   APPEARANCEUR CLOUDY (A) 09/17/2018 1842   LABSPEC 1.018 09/17/2018 1842  PHURINE 6.0 09/17/2018 1842   GLUCOSEU NEGATIVE 09/17/2018 1842   HGBUR LARGE (A) 09/17/2018 1842   BILIRUBINUR NEGATIVE 09/17/2018 [place] 09/17/2018 1842   PROTEINUR 100 (A) 09/17/2018 1842   UROBILINOGEN 0.2 01/17/2015 1530   NITRITE NEGATIVE 09/17/2018 1842   LEUKOCYTESUR LARGE (A) 09/17/2018 1842   Sepsis Labs: @LABRCNTIP$ (procalcitonin:4,lacticidven:4) ) Recent Results (from the past 240 hour(s))  Resp panel by RT-PCR (RSV, Flu A&B, Covid) Anterior Nasal Swab     Status: None   Collection Time: 06/02/22  9:10 PM   Specimen: Anterior Nasal Swab  Result Value Ref Range Status   SARS Coronavirus 2 by RT PCR NEGATIVE NEGATIVE Final    Comment: (NOTE) SARS-CoV-2 target nucleic acids are NOT DETECTED.  The SARS-CoV-2 RNA is generally detectable in upper respiratory specimens during the acute phase of infection. The lowest concentration of SARS-CoV-2 viral copies this assay can detect is 138 copies/mL. A negative result does not preclude SARS-Cov-2 infection and should not be used as the sole basis for treatment or other patient management decisions. A negative result may occur with  improper specimen  collection/handling, submission of specimen other than nasopharyngeal swab, presence of viral mutation(s) within the areas targeted by this assay, and inadequate number of viral copies(<138 copies/mL). A negative result must be combined with clinical observations, patient history, and epidemiological information. The expected result is Negative.  Fact Sheet for Patients:  EntrepreneurPulse.com.au  Fact Sheet for Healthcare Providers:  IncredibleEmployment.be  This test is no t yet approved or cleared by the [place] FDA and  has been authorized for detection and/or diagnosis of SARS-CoV-2 by FDA under an Emergency Use Authorization (EUA). This EUA will remain  in effect (meaning this test can be used) for the duration of the COVID-19 declaration under Section 564(b)(1) of the Act, 21 U.S.C.section 360bbb-3(b)(1), unless the authorization is terminated  or revoked sooner.       Influenza A by PCR NEGATIVE NEGATIVE Final   Influenza B by PCR NEGATIVE NEGATIVE Final    Comment: (NOTE) The Xpert Xpress SARS-CoV-2/FLU/RSV plus assay is intended as an aid in the diagnosis of influenza from Nasopharyngeal swab specimens and should not be used as a sole basis for treatment. Nasal washings and aspirates are unacceptable for Xpert Xpress SARS-CoV-2/FLU/RSV testing.  Fact Sheet for Patients: EntrepreneurPulse.com.au  Fact Sheet for Healthcare Providers: IncredibleEmployment.be  This test is not yet approved or cleared by the [place] FDA and has been authorized for detection and/or diagnosis of SARS-CoV-2 by FDA under an Emergency Use Authorization (EUA). This EUA will remain in effect (meaning this test can be used) for the duration of the COVID-19 declaration under Section 564(b)(1) of the Act, 21 U.S.C. section 360bbb-3(b)(1), unless the authorization is terminated or revoked.     Resp Syncytial  Virus by PCR NEGATIVE NEGATIVE Final    Comment: (NOTE) Fact Sheet for Patients: EntrepreneurPulse.com.au  Fact Sheet for Healthcare Providers: IncredibleEmployment.be  This test is not yet approved or cleared by the [place] FDA and has been authorized for detection and/or diagnosis of SARS-CoV-2 by FDA under an Emergency Use Authorization (EUA). This EUA will remain in effect (meaning this test can be used) for the duration of the COVID-19 declaration under Section 564(b)(1) of the Act, 21 U.S.C. section 360bbb-3(b)(1), unless the authorization is terminated or revoked.  Performed at [facility], [address], [place], [place] [address]   Blood Culture (routine x 2)     Status: None (  Preliminary result)   Collection Time: 06/03/22  2:33 AM   Specimen: BLOOD  Result Value Ref Range Status   Specimen Description BLOOD BLOOD LEFT ARM  Final   Special Requests   Final    AEROBIC BOTTLE ONLY Blood Culture adequate volume Performed at [facility], [address] [place], [place] [address]    Culture PENDING  Incomplete   Report Status PENDING  Incomplete  MRSA Next Gen by PCR, Nasal     Status: None   Collection Time: 06/03/22  2:42 AM   Specimen: Nasal Mucosa; Nasal Swab  Result Value Ref Range Status   MRSA by PCR Next Gen NOT DETECTED NOT DETECTED Final    Comment: (NOTE) The GeneXpert MRSA Assay (FDA approved for NASAL specimens only), is one component of a comprehensive MRSA colonization surveillance program. It is not intended to diagnose MRSA infection nor to guide or monitor treatment for MRSA infections. Test performance is not FDA approved in patients less than 4 years old. Performed at [facility], [place] [address], [place], [place] [address]      Radiological Exams on Admission: CT ABDOMEN PELVIS WO CONTRAST  Result Date: 06/02/2022 CLINICAL DATA:  Abdomen pain decreased  appetite EXAM: CT ABDOMEN AND PELVIS WITHOUT CONTRAST TECHNIQUE: Multidetector CT imaging of the abdomen and pelvis was performed following the standard protocol without IV contrast. RADIATION DOSE REDUCTION: This exam was performed according to the departmental dose-optimization program which includes automated exposure control, adjustment of the mA and/or kV according to patient size and/or use of iterative reconstruction technique. COMPARISON:  CT 07/11/2005 FINDINGS: Lower chest: Lung bases demonstrate emphysema. Mild bronchiectasis and scarring in the left lung base. Hepatobiliary: No calcified gallstone or biliary dilatation. No gross focal hepatic abnormality without contrast Pancreas: Negative for inflammation Spleen: Grossly negative Adrenals/Urinary Tract: Adrenal glands are within normal limits. Kidneys show no hydronephrosis. Slightly thick-walled appearance of the bladder. 14 mm stone within the right posterior bladder. Pedunculated mass measuring 5.3 by 3.7 cm, possibly contiguous with prostate. Stomach/Bowel: Limited assessment due to absence of contrast, and cachectic-appearing patient with lack of intra-abdominal fat and poor contrast. No gross bowel distension or bowel wall thickening. Fluid-filled nondilated bowel in the abdomen and pelvis Vascular/Lymphatic: Advanced aortic atherosclerosis. No aneurysm. No grossly enlarged lymph nodes. Reproductive: Enlarged prostate possibly contiguous with a pedunculated bladder mass. Other: Negative for pelvic effusion or free air. Musculoskeletal: Age indeterminate moderate superior endplate compression fractures at T12 and L1. IMPRESSION: 1. Markedly limited exam secondary to absence of intra-abdominal fat/cachectic-appearing patient and absence of oral or intravenous contrast. 2. Slightly thick-walled appearance of the urinary bladder, possible chronic obstruction versus cystitis. Large 5.3 cm pedunculated mass within the bladder lumen, possibly contiguous  with enlarged prostate. Recommend direct visualization. 14 mm bladder stone 3. Emphysema Electronically Signed   By: Donavan Foil M.D.   On: 06/02/2022 23:29   DG Chest Port 1 View  Result Date: 06/02/2022 CLINICAL DATA:  Possible sepsis.  Weight loss. EXAM: PORTABLE CHEST 1 VIEW COMPARISON:  09/17/2018. FINDINGS: The heart size and mediastinal contours are within normal limits. There is atherosclerotic calcification of the lungs. Hyperinflation of the lungs is noted bilaterally. No consolidation, effusion, or pneumothorax. Bilateral nipple shadows are noted and seen on previous exams. No acute osseous abnormality. IMPRESSION: 1. No active disease. 2. Hyperinflation of the lungs. Electronically Signed   By: Brett Fairy M.D.   On: 06/02/2022 20:38    EKG: Independently reviewed.  Normal  sinus rhythm.  Assessment/Plan Principal Problem:   ARF (acute renal failure) ([facility]) Active Problems:   PAD (peripheral artery disease) ([place])   Cardiomyopathy due to hypertension ([facility]CC)   HTN (hypertension)   Anemia, chronic disease   Dementia ([facility])   Severe sepsis ([facility])   Lactic acidosis   ARF (acute renal failure) with cortical necrosis ([facility])    Hypotension with lactic acidosis -    could be from dehydration, probably from poor oral intake.  However since there was some concern for sepsis empiric antibiotics were started.  Will discontinue if cultures are negative.  Continue with hydration.  Follow lactic acid level. Acute renal failure could be from poor oral intake.  Gently hydrate follow metabolic panel.  UA is pending. Anemia microcytic hypochromic picture -   stool for occult blood was negative.  As per the chart patient previously has had AVMs in the colon.  Will repeat CBC check anemia panel.  If there is any further worsening in hemoglobin may need transfusion and also GI input. History of peripheral artery disease presently holding Plavix and we make sure there is no further worsening in  hemoglobin. COPD not actively wheezing. Prior to of alcohol abuse on acamprosate and naltrexone. Bladder mass will need urology input. History of dementia.  Since patient has acute renal failure with worsening anemia will need close monitoring and more than 2 midnight stay inpatient status.   DVT prophylaxis: SCD's. Code Status: Full code.   Family Communication: Unable to reach family. Disposition Plan: Admit to stepdown. Consults called: None. Admission status: Inpatient.

## 2022-06-03 NOTE — ED Notes (Signed)
Attempted report x1, receiving RN at receiving facility to call back for report.

## 2022-06-03 NOTE — Progress Notes (Addendum)
PROGRESS NOTE    Leon Foster  [id number] DOB: 1940-07-29 DOA: 06/02/2022 PCP: [name], No   Brief Narrative:   Leon Foster is a 82 y.o. male with history of peripheral arterial disease, COPD, alcohol abuse, prior history of AV malformation of the colon was brought to the ER after patient was found to be not eating well as seen by the family.     ED Course: In the ER patient was found to be hypotensive with a systolic in the [id number] lactic acid was elevated at 3.  Labs also showed creatinine of 3.6 which increased from 1.2 about 3 years ago on 7/20.  Hemoglobin has dropped by 4 g from previous of 14 and is 9.7.  Stool for occult blood was negative.  Chest x-ray unremarkable CT abdomen pelvis done shows bladder mass.  Patient was placed on fluids empiric antibiotics admitted for further management.    Assessment & Plan:  Acute kidney failure: -Likely from poor p.o. intake.  Creatinine: 3.63 upon arrival.  Baseline 1.1-1.2.  CT abdomen concerning for chronic obstruction versus cystitis.  Large 5.3 cm mass within the bladder lumen possible contiguous with enlarged prostate.  No hydronephrosis or pyelonephritis. -Patient started on IV fluids. -Continue IV fluids.  Avoid nephrotoxic medications.  Renal function slightly improved this morning  Hypotensive Lactic acidosis: UTI -Likely poor p.o. intake.  Blood pressure improved with IV fluids.  Lactic acid trended down to normal -Continue IV fluids. -UA concerning for infection.    Urine culture is pending. -Patient started on cefepime and vancomycin upon arrival.  Will narrow it down to Rocephin.  He is currently afebrile with no leukocytosis.  Anemia: -Dietary versus GI source -H&H 9.7/31.3: Was 14.0/42.83 years ago.  FOBT negative.  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  Iron panel, B12, folate all pending -Transfuse as needed.  Repeat CBC pending  History of PAD: On Plavix.  Will continue  to hold for now  COPD: On room air.  Not in acute exacerbation.  Prior history of alcohol abuse: On acamprosate and naltrexone  Bladder mass: Noted on CT abdomen/pelvis. -Could be enlarged prostate. -Needs outpatient follow-up with urology  History of dementia: Aware  Severe protein calorie malnutrition: -BMI of 15.  Albumin of 2.8 -Will consult dietitian  Sinus bradycardia: -Heart rate is in 40s and 50s.  TSH: WNL -Will give 500 cc of IV bolus, check troponin, ordered echocardiogram -Continue to monitor on telemetry.  Repeat EKG  DVT prophylaxis: SCD Code Status: Full code Family Communication:  None present at bedside.  Plan of care discussed with patient in length and he verbalized understanding and agreed with it. Disposition Plan: To be determined  Consultants:  None  Procedures:  None  Antimicrobials:  Cefepime Vancomycin Switched to Rocephin on 2/17  Status is: Inpatient      Subjective: Patient seen and examined.  Resting comfortably on the bed.  Denies any complaint.  No nausea, vomiting, abdominal pain, diarrhea, headache, blurry vision, fever or chills.   Objective: Vitals:   06/03/22 0637 06/03/22 0638 06/03/22 0639 06/03/22 0745  BP:      Pulse: (!) 43 (!) 46 (!) 47   Resp: 12 12 12   $ Temp:    97.8 F (36.6 C)  TempSrc:    Axillary  SpO2: 100% 100% 100%   Weight:      Height:        Intake/Output Summary (Last 24 hours) at  06/03/2022 0936 Last data filed at 06/03/2022 0517 Gross per 24 hour  Intake 3475.09 ml  Output 100 ml  Net 3375.09 ml   Filed Weights   06/03/22 0200  Weight: 44.9 kg    Examination:  General exam: Appears calm and comfortable, on room air, appears dehydrated, cachectic, dry oral mucosa Respiratory system: Clear to auscultation. Respiratory effort normal. Cardiovascular system: S1 & S2 heard, RRR. No JVD, murmurs, rubs, gallops or clicks. No pedal edema. Gastrointestinal system: Abdomen is nondistended, soft  and nontender. No organomegaly or masses felt. Normal bowel sounds heard. Central nervous system: Alert and oriented. No focal neurological deficits. Extremities: Symmetric 5 x 5 power.  Left transmetatarsal amputation. Skin: No rashes, lesions or ulcers Psychiatry: Judgement and insight appear normal. Mood & affect appropriate.    Data Reviewed: I have personally reviewed following labs and imaging studies  CBC: Recent Labs  Lab 06/02/22 2021 06/03/22 0813  WBC 5.4 5.9  NEUTROABS 2.3 4.1  HGB 9.7* 8.1*  HCT 31.3* 26.8*  MCV 70.8* 72.2*  PLT 348 [id number]   Basic Metabolic Panel: Recent Labs  Lab 06/02/22 2021 06/03/22 0659  NA 138 137  K 3.8 4.2  CL 104 105  CO2 19* 23  GLUCOSE 108* 83  BUN 58* 51*  CREATININE 3.63* 2.86*  CALCIUM 9.3 8.1*  MG  --  2.3   GFR: Estimated Creatinine Clearance: 13.1 mL/min (A) (by C-G formula based on SCr of 2.86 mg/dL (H)). Liver Function Tests: Recent Labs  Lab 06/02/22 2021 06/03/22 0659  AST 10* 12*  ALT <5 6  ALKPHOS 42 35*  BILITOT 0.6 0.8  PROT 7.2 5.5*  ALBUMIN 3.9 2.8*   No results for input(s): "LIPASE", "AMYLASE" in the last 168 hours. No results for input(s): "AMMONIA" in the last 168 hours. Coagulation Profile: Recent Labs  Lab 06/02/22 2110  INR 1.1   Cardiac Enzymes: No results for input(s): "CKTOTAL", "CKMB", "CKMBINDEX", "TROPONINI" in the last 168 hours. BNP (last 3 results) No results for input(s): "PROBNP" in the last 8760 hours. HbA1C: No results for input(s): "HGBA1C" in the last 72 hours. CBG: No results for input(s): "GLUCAP" in the last 168 hours. Lipid Profile: No results for input(s): "CHOL", "HDL", "LDLCALC", "TRIG", "CHOLHDL", "LDLDIRECT" in the last 72 hours. Thyroid Function Tests: No results for input(s): "TSH", "T4TOTAL", "FREET4", "T3FREE", "THYROIDAB" in the last 72 hours. Anemia Panel: No results for input(s): "VITAMINB12", "FOLATE", "FERRITIN", "TIBC", "IRON", "RETICCTPCT" in the last  72 hours. Sepsis Labs: Recent Labs  Lab 06/02/22 2109 06/03/22 0017 06/03/22 0808  LATICACIDVEN 3.4* 8.7* 1.8    Recent Results (from the past 240 hour(s))  Resp panel by RT-PCR (RSV, Flu A&B, Covid) Anterior Nasal Swab     Status: None   Collection Time: 06/02/22  9:10 PM   Specimen: Anterior Nasal Swab  Result Value Ref Range Status   SARS Coronavirus 2 by RT PCR NEGATIVE NEGATIVE Final    Comment: (NOTE) SARS-CoV-2 target nucleic acids are NOT DETECTED.  The SARS-CoV-2 RNA is generally detectable in upper respiratory specimens during the acute phase of infection. The lowest concentration of SARS-CoV-2 viral copies this assay can detect is 138 copies/mL. A negative result does not preclude SARS-Cov-2 infection and should not be used as the sole basis for treatment or other patient management decisions. A negative result may occur with  improper specimen collection/handling, submission of specimen other than nasopharyngeal swab, presence of viral mutation(s) within the areas targeted by this assay, and inadequate  number of viral copies(<138 copies/mL). A negative result must be combined with clinical observations, patient history, and epidemiological information. The expected result is Negative.  Fact Sheet for Patients:  EntrepreneurPulse.com.au  Fact Sheet for Healthcare Providers:  IncredibleEmployment.be  This test is no t yet approved or cleared by the [place] FDA and  has been authorized for detection and/or diagnosis of SARS-CoV-2 by FDA under an Emergency Use Authorization (EUA). This EUA will remain  in effect (meaning this test can be used) for the duration of the COVID-19 declaration under Section 564(b)(1) of the Act, 21 U.S.C.section 360bbb-3(b)(1), unless the authorization is terminated  or revoked sooner.       Influenza A by PCR NEGATIVE NEGATIVE Final   Influenza B by PCR NEGATIVE NEGATIVE Final    Comment:  (NOTE) The Xpert Xpress SARS-CoV-2/FLU/RSV plus assay is intended as an aid in the diagnosis of influenza from Nasopharyngeal swab specimens and should not be used as a sole basis for treatment. Nasal washings and aspirates are unacceptable for Xpert Xpress SARS-CoV-2/FLU/RSV testing.  Fact Sheet for Patients: EntrepreneurPulse.com.au  Fact Sheet for Healthcare Providers: IncredibleEmployment.be  This test is not yet approved or cleared by the [place] FDA and has been authorized for detection and/or diagnosis of SARS-CoV-2 by FDA under an Emergency Use Authorization (EUA). This EUA will remain in effect (meaning this test can be used) for the duration of the COVID-19 declaration under Section 564(b)(1) of the Act, 21 U.S.C. section 360bbb-3(b)(1), unless the authorization is terminated or revoked.     Resp Syncytial Virus by PCR NEGATIVE NEGATIVE Final    Comment: (NOTE) Fact Sheet for Patients: EntrepreneurPulse.com.au  Fact Sheet for Healthcare Providers: IncredibleEmployment.be  This test is not yet approved or cleared by the [place] FDA and has been authorized for detection and/or diagnosis of SARS-CoV-2 by FDA under an Emergency Use Authorization (EUA). This EUA will remain in effect (meaning this test can be used) for the duration of the COVID-19 declaration under Section 564(b)(1) of the Act, 21 U.S.C. section 360bbb-3(b)(1), unless the authorization is terminated or revoked.  Performed at [facility], [address] [place], [place] [address]   Blood Culture (routine x 2)     Status: None (Preliminary result)   Collection Time: 06/03/22  2:33 AM   Specimen: BLOOD LEFT ARM  Result Value Ref Range Status   Specimen Description BLOOD LEFT ARM  Final   Special Requests   Final    BOTTLES DRAWN AEROBIC ONLY Blood Culture adequate volume Performed at [facility], [place] [address], [place], [place] [address]    Culture PENDING  Incomplete   Report Status PENDING  Incomplete  MRSA Next Gen by PCR, Nasal     Status: None   Collection Time: 06/03/22  2:42 AM   Specimen: Nasal Mucosa; Nasal Swab  Result Value Ref Range Status   MRSA by PCR Next Gen NOT DETECTED NOT DETECTED Final    Comment: (NOTE) The GeneXpert MRSA Assay (FDA approved for NASAL specimens only), is one component of a comprehensive MRSA colonization surveillance program. It is not intended to diagnose MRSA infection nor to guide or monitor treatment for MRSA infections. Test performance is not FDA approved in patients less than 58 years old. Performed at [facility], [place] [address] [place],  [address]       Radiology Studies: [place] EKG SITE RITE  Result Date: 06/03/2022 If [facility] image not attached, placement could  not be confirmed due to current cardiac rhythm.  CT ABDOMEN PELVIS WO CONTRAST  Result Date: 06/02/2022 CLINICAL DATA:  Abdomen pain decreased appetite EXAM: CT ABDOMEN AND PELVIS WITHOUT CONTRAST TECHNIQUE: Multidetector CT imaging of the abdomen and pelvis was performed following the standard protocol without IV contrast. RADIATION DOSE REDUCTION: This exam was performed according to the departmental dose-optimization program which includes automated exposure control, adjustment of the mA and/or kV according to patient size and/or use of iterative reconstruction technique. COMPARISON:  CT 07/11/2005 FINDINGS: Lower chest: Lung bases demonstrate emphysema. Mild bronchiectasis and scarring in the left lung base. Hepatobiliary: No calcified gallstone or biliary dilatation. No gross focal hepatic abnormality without contrast Pancreas: Negative for inflammation Spleen: Grossly negative Adrenals/Urinary Tract: Adrenal glands are within normal limits. Kidneys show no hydronephrosis. Slightly thick-walled appearance of the bladder. 14 mm stone  within the right posterior bladder. Pedunculated mass measuring 5.3 by 3.7 cm, possibly contiguous with prostate. Stomach/Bowel: Limited assessment due to absence of contrast, and cachectic-appearing patient with lack of intra-abdominal fat and poor contrast. No gross bowel distension or bowel wall thickening. Fluid-filled nondilated bowel in the abdomen and pelvis Vascular/Lymphatic: Advanced aortic atherosclerosis. No aneurysm. No grossly enlarged lymph nodes. Reproductive: Enlarged prostate possibly contiguous with a pedunculated bladder mass. Other: Negative for pelvic effusion or free air. Musculoskeletal: Age indeterminate moderate superior endplate compression fractures at T12 and L1. IMPRESSION: 1. Markedly limited exam secondary to absence of intra-abdominal fat/cachectic-appearing patient and absence of oral or intravenous contrast. 2. Slightly thick-walled appearance of the urinary bladder, possible chronic obstruction versus cystitis. Large 5.3 cm pedunculated mass within the bladder lumen, possibly contiguous with enlarged prostate. Recommend direct visualization. 14 mm bladder stone 3. Emphysema Electronically Signed   By: Donavan Foil M.D.   On: 06/02/2022 23:29   DG Chest Port 1 View  Result Date: 06/02/2022 CLINICAL DATA:  Possible sepsis.  Weight loss. EXAM: PORTABLE CHEST 1 VIEW COMPARISON:  09/17/2018. FINDINGS: The heart size and mediastinal contours are within normal limits. There is atherosclerotic calcification of the lungs. Hyperinflation of the lungs is noted bilaterally. No consolidation, effusion, or pneumothorax. Bilateral nipple shadows are noted and seen on previous exams. No acute osseous abnormality. IMPRESSION: 1. No active disease. 2. Hyperinflation of the lungs. Electronically Signed   By: Brett Fairy M.D.   On: 06/02/2022 20:38    Scheduled Meds:  acamprosate  333 mg Oral TID WC   Chlorhexidine Gluconate Cloth  6 each Topical Daily   donepezil  10 mg Oral QHS    naltrexone  50 mg Oral Daily   mouth rinse  15 mL Mouth Rinse 4 times per day   vancomycin variable dose per unstable renal function (pharmacist dosing)   Does not apply See admin instructions   Continuous Infusions:  sodium chloride 125 mL/hr at 06/03/22 0245   ceFEPime (MAXIPIME) IV       LOS: 0 days   Time spent: 35 minutes   Emaleigh Guimond Loann Quill, MD [facility]  If 7PM-7AM, please contact night-coverage [email or web address] 06/03/2022, 9:36 AM

## 2022-06-03 NOTE — ED Notes (Signed)
[facility] at bedside. Report provided.

## 2022-06-03 NOTE — Progress Notes (Signed)
Patient lives in an assisted living facility

## 2022-06-03 NOTE — Progress Notes (Signed)
Patient sister called for update, sister states patient had not eaten in 3 weeks, last seen patient was 2 weeks ago and patient was not eating and looked extremely dehydrated, states patient was sent to ED due to he needed blood transfusion and needed fluids due to his dehydration, also stated that patient was also going to have test done on his kidneys and was waiting for doctors to call with updates.

## 2022-06-03 NOTE — Plan of Care (Signed)

## 2022-06-03 NOTE — Plan of Care (Signed)
[facility] will assume care on arrival to accepting facility. Until arrival, care as per EDP. However, [facility] available 24/7 for questions and assistance.  Nursing staff, please page [facility] Admits and Consults ([phone]) as soon as the patient arrives to the hospital.   

## 2022-06-03 NOTE — ED Notes (Signed)
Bladder scan showing 0 ml in bladder x2. Provider notified of same.

## 2022-06-03 NOTE — Progress Notes (Signed)
Peripherally Inserted Central Catheter Placement  The IV Nurse has discussed with the patient and/or persons authorized to consent for the patient, the purpose of this procedure and the potential benefits and risks involved with this procedure.  The benefits include less needle sticks, lab draws from the catheter, and the patient may be discharged home with the catheter. Risks include, but not limited to, infection, bleeding, blood clot (thrombus formation), and puncture of an artery; nerve damage and irregular heartbeat and possibility to perform a PICC exchange if needed/ordered by physician.  Alternatives to this procedure were also discussed.  Bard Power PICC patient education guide, fact sheet on infection prevention and patient information card has been provided to patient /or left at bedside.  Telephone consent obtaied from the sister due to pt with hx of dementia.  PICC Placement Documentation  PICC Double Lumen 06/03/22 Right Brachial 37 cm 0 cm (Active)  Indication for Insertion or Continuance of Line Limited venous access - need for IV therapy >5 days (PICC only) 06/03/22 1107  Exposed Catheter (cm) 0 cm 06/03/22 1107  Site Assessment Clean, Dry, Intact 06/03/22 1107  Lumen #1 Status Flushed;Saline locked;Blood return noted 06/03/22 1107  Lumen #2 Status Flushed;Saline locked;Blood return noted 06/03/22 1107  Dressing Type Transparent;Securing device 06/03/22 1107  Dressing Status Antimicrobial disc in place;Clean, Dry, Intact 06/03/22 1107  Safety Lock Not Applicable [id number]  Line Care Connections checked and tightened 06/03/22 1107  Line Adjustment (NICU/IV Team Only) No 06/03/22 1107  Dressing Intervention New dressing 06/03/22 1107  Dressing Change Due 06/10/22 06/03/22 1107       Rolena Infante 06/03/2022, 11:07 AM

## 2022-06-04 ENCOUNTER — Inpatient Hospital Stay (HOSPITAL_COMMUNITY): Payer: Medicare Other

## 2022-06-04 DIAGNOSIS — R9431 Abnormal electrocardiogram [ECG] [EKG]: Secondary | ICD-10-CM

## 2022-06-04 DIAGNOSIS — N179 Acute kidney failure, unspecified: Secondary | ICD-10-CM | POA: Diagnosis not present

## 2022-06-04 LAB — COMPREHENSIVE METABOLIC PANEL
ALT: 6 U/L (ref 0–44)
AST: 12 U/L — ABNORMAL LOW (ref 15–41)
Albumin: 2.5 g/dL — ABNORMAL LOW (ref 3.5–5.0)
Alkaline Phosphatase: 38 U/L (ref 38–126)
Anion gap: 4 — ABNORMAL LOW (ref 5–15)
BUN: 34 mg/dL — ABNORMAL HIGH (ref 8–23)
CO2: 20 mmol/L — ABNORMAL LOW (ref 22–32)
Calcium: 7.3 mg/dL — ABNORMAL LOW (ref 8.9–10.3)
Chloride: 113 mmol/L — ABNORMAL HIGH (ref 98–111)
Creatinine, Ser: 1.6 mg/dL — ABNORMAL HIGH (ref 0.61–1.24)
GFR, Estimated: 43 mL/min — ABNORMAL LOW (ref >60)
Glucose, Bld: 84 mg/dL (ref 70–99)
Potassium: 4.2 mmol/L (ref 3.5–5.1)
Sodium: 137 mmol/L (ref 135–145)
Total Bilirubin: 0.6 mg/dL (ref 0.3–1.2)
Total Protein: 5.4 g/dL — ABNORMAL LOW (ref 6.5–8.1)

## 2022-06-04 LAB — URINE CULTURE: Culture: 20000 — AB

## 2022-06-04 LAB — CBC
HCT: 26.2 % — ABNORMAL LOW (ref 39.0–52.0)
Hemoglobin: 7.9 g/dL — ABNORMAL LOW (ref 13.0–17.0)
MCH: 22.3 pg — ABNORMAL LOW (ref 26.0–34.0)
MCHC: 30.2 g/dL (ref 30.0–36.0)
MCV: 74 fL — ABNORMAL LOW (ref 80.0–100.0)
Platelets: 247 10*3/uL (ref 150–400)
RBC: 3.54 MIL/uL — ABNORMAL LOW (ref 4.22–5.81)
RDW: 22.2 % — ABNORMAL HIGH (ref 11.5–15.5)
WBC: 6.8 10*3/uL (ref 4.0–10.5)
nRBC: 0 % (ref 0.0–0.2)

## 2022-06-04 LAB — ECHOCARDIOGRAM COMPLETE
Height: 67.008 in
S' Lateral: 2.9 cm
Weight: 1583.78 oz

## 2022-06-04 LAB — CORTISOL: Cortisol, Plasma: 12.4 ug/dL

## 2022-06-04 LAB — GLUCOSE, CAPILLARY: Glucose-Capillary: 72 mg/dL (ref 70–99)

## 2022-06-04 MED ORDER — ENSURE ENLIVE PO LIQD
237.0000 mL | Freq: Three times a day (TID) | ORAL | Status: DC
  Administered 2022-06-04 – 2022-06-07 (×11): 237 mL via ORAL

## 2022-06-04 MED ORDER — SODIUM CHLORIDE 0.9 % IV SOLN
150.0000 mg | INTRAVENOUS | Status: AC
  Administered 2022-06-04 – 2022-06-06 (×3): 150 mg via INTRAVENOUS
  Filled 2022-06-04 (×3): qty 7.5

## 2022-06-04 MED ORDER — SODIUM CHLORIDE 0.9 % IV SOLN: INTRAVENOUS | Status: DC

## 2022-06-04 NOTE — Progress Notes (Signed)
Initial Nutrition Assessment RD working remotely.   DOCUMENTATION CODES:   Underweight  INTERVENTION:  - ordered Ensure Plus High Protein TID, each supplement provides 350 kcal and 20 grams of protein.  - ordered 48 hour Calorie Count.  - complete NFPE when feasible.   NUTRITION DIAGNOSIS:   Inadequate oral intake related to acute illness, chronic illness (dementia) as evidenced by per patient/family report.  GOAL:   Patient will meet greater than or equal to 90% of their needs  MONITOR:   PO intake, Supplement acceptance, Labs, Weight trends  REASON FOR ASSESSMENT:   Malnutrition Screening Tool, Consult Assessment of nutrition requirement/status, Calorie Count, Poor PO  ASSESSMENT:   82 y.o. male with medical history of peripheral arterial disease, COPD, alcohol abuse, cardiomyopathy, seizures, dementia, HTN, afib, arthritis, prior hx of AV malformation of the colon. He was brought to the ED after he was found to be not eating well as seen by the family.  Patient noted to be a/o to self only. Diet advanced from NPO to FLD, thin liquids yesterday at 0606 and to Regular, thin liquids today at 0727. No meal completion percentages documented in the flow sheet.  He has not been assessed by a  RD at any time in the past.  Weight yesterday was 99 lb and PTA the most recently documented weight was 111 lb on 11/13/21. This indicates 12 lb / 10.8% body weight in the past 7 months.   Per notes: - AKI - UTI - lactic acidosis - anemia--thought to be diet-related vs from a GI source - bladder mass--recommendation for outpatient follow-up with urology - severe PCM   Labs reviewed; Cl: 113 mmol/l, BUN: 34 mg/dl, creatinine: 1.6 mg/dl, Ca: 7.3 mg/dl, GFR: 43 ml/min.  Medications reviewed.     NUTRITION - FOCUSED PHYSICAL EXAM:  RD working remotely.  Diet Order:   Diet Order             Diet regular Room service appropriate? Yes; Fluid consistency: Thin  Diet  effective now                   EDUCATION NEEDS:   Not appropriate for education at this time  Skin:  Skin Assessment: Reviewed RN Assessment  Last BM:  2/18 (type 6 x1, large amount)  Height:   Ht Readings from Last 1 Encounters:  06/03/22 5' 7.01" (1.702 m)    Weight:   Wt Readings from Last 1 Encounters:  06/03/22 44.9 kg     BMI:  Body mass index is 15.5 kg/m.  Estimated Nutritional Needs:  Kcal:  1570-1800 kcal Protein:  75-90 grams Fluid:  >/= 1.7 L/day      Jarome Matin, MS, RD, LDN, CNSC Clinical Dietitian PRN/Relief staff On-call/weekend pager # available in [facility]

## 2022-06-04 NOTE — Progress Notes (Signed)
Echocardiogram 2D Echocardiogram has been performed.  Frances Furbish 06/04/2022, 10:41 AM

## 2022-06-04 NOTE — Progress Notes (Addendum)
PROGRESS NOTE    Leon Foster  [id number] DOB: October 02, 1940 DOA: 06/02/2022 PCP: [name], No   Brief Narrative:   Leon Foster is a 82 y.o. male with history of peripheral arterial disease, COPD, alcohol abuse, prior history of AV malformation of the colon was brought to the ER after patient was found to be not eating well as seen by the family.     ED Course: In the ER patient was found to be hypotensive with a systolic in the [id number] lactic acid was elevated at 3.  Labs also showed creatinine of 3.6 which increased from 1.2 about 3 years ago on 7/20.  Hemoglobin has dropped by 4 g from previous of 14 and is 9.7.  Stool for occult blood was negative.  Chest x-ray unremarkable CT abdomen pelvis done shows bladder mass.  Patient was placed on fluids empiric antibiotics admitted for further management.    Assessment & Plan:  Acute kidney failure: -Likely from poor p.o. intake.  Creatinine: 3.63 upon arrival.  Baseline 1.1-1.2.  CT abdomen concerning for chronic obstruction versus cystitis.  Large 5.3 cm mass within the bladder lumen possible contiguous with enlarged prostate.  No hydronephrosis or pyelonephritis. -Renal function improving.  Creatinine: 1.6.  Continue IV fluids.  Encourage p.o. intake -Strict INO's and daily weight.  Hypotensive Lactic acidosis: UTI -Likely poor p.o. intake.  Blood pressure improved with IV fluids.  Lactic acid trended down to normal -Continue IV fluids. -UA concerning for infection.    Urine culture positive for multiple species, suggest recollection.. -Patient started on cefepime and vancomycin upon arrival.  Will narrow it down to Rocephin.  He is currently afebrile with no leukocytosis.  Will continue Rocephin x 3 days.  Microcytic anemia: -Dietary versus GI source -H&H 9.7/31.3: Was 14.0/42.83 years ago.  FOBT negative.  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  Iron panel suggestive of iron deficiency.   Will start on iron infusion..  Normal B12 and folate. -Transfuse as needed.    History of PAD: On Plavix.  Will continue to hold for now  COPD: On room air.  Not in acute exacerbation.  Prior history of alcohol abuse: On acamprosate and naltrexone  Bladder mass: Noted on CT abdomen/pelvis. -Could be enlarged prostate. -Needs outpatient follow-up with urology  History of dementia: Aware  Severe protein calorie malnutrition: -BMI of 15.  Albumin of 2.8 -Appreciate dietitian recommendations  Sinus bradycardia: -Heart rate is in 40s and 50s.  TSH: WNL -Troponin: WNL.  EKG showed sinus bradycardia.-Continue to monitor on telemetry.   -Echo is pending.  Patient is asymptomatic.  DVT prophylaxis: SCD Code Status: Full code Family Communication:  None present at bedside.  Plan of care discussed with patient in length and he verbalized understanding and agreed with it.  I called patient's sister and updated her about patient's plan of care and she verbalized understanding.  Disposition Plan: To be determined  Consultants:  None  Procedures:  None  Antimicrobials:  Cefepime Vancomycin Switched to Rocephin on 2/17  Status is: Inpatient      Subjective: Patient seen and examined.  Resting comfortably on the bed.  Denies: 1.6 complaint.  No nausea, vomiting, abdominal pain, diarrhea, headache, blurry vision, fever or chills.   Objective: Vitals:   06/04/22 0400 06/04/22 0500 06/04/22 0600 06/04/22 0848  BP: (!) 113/48 (!) 120/49 115/68   Pulse: (!) 49 (!) 49 (!) 54   Resp: 10 11 18  Temp:    97.6 F (36.4 C)  TempSrc:    Oral  SpO2: 100% 100% 100%   Weight:      Height:        Intake/Output Summary (Last 24 hours) at 06/04/2022 1044 Last data filed at 06/04/2022 0600 Gross per 24 hour  Intake 2096.48 ml  Output 225 ml  Net 1871.48 ml    Filed Weights   06/03/22 0200  Weight: 44.9 kg    Examination:  General exam: Appears calm and comfortable, on room  air, appears dehydrated, cachectic, dry oral mucosa Respiratory system: Clear to auscultation. Respiratory effort normal. Cardiovascular system: S1 & S2 heard, RRR. No JVD, murmurs, rubs, gallops or clicks. No pedal edema. Gastrointestinal system: Abdomen is nondistended, soft and nontender. No organomegaly or masses felt. Normal bowel sounds heard. Central nervous system: Alert and oriented. No focal neurological deficits. Extremities: Symmetric 5 x 5 power.  Left transmetatarsal amputation. Skin: No rashes, lesions or ulcers Psychiatry: Judgement and insight appear normal. Mood & affect appropriate.    Data Reviewed: I have personally reviewed following labs and imaging studies  CBC: Recent Labs  Lab 06/02/22 2021 06/03/22 0813 06/04/22 0649  WBC 5.4 5.9 6.8  NEUTROABS 2.3 4.1  --   HGB 9.7* 8.1* 7.9*  HCT 31.3* 26.8* 26.2*  MCV 70.8* 72.2* 74.0*  PLT 348 283 [id number]    Basic Metabolic Panel: Recent Labs  Lab 06/02/22 2021 06/03/22 0659 06/04/22 0649  NA 138 137 137  K 3.8 4.2 4.2  CL 104 105 113*  CO2 19* 23 20*  GLUCOSE 108* 83 84  BUN 58* 51* 34*  CREATININE 3.63* 2.86* 1.60*  CALCIUM 9.3 8.1* 7.3*  MG  --  2.3  --     GFR: Estimated Creatinine Clearance: 23.4 mL/min (A) (by C-G formula based on SCr of 1.6 mg/dL (H)). Liver Function Tests: Recent Labs  Lab 06/02/22 2021 06/03/22 0659 06/04/22 0649  AST 10* 12* 12*  ALT 5 6 6  $ ALKPHOS 42 35* 38  BILITOT 0.6 0.8 0.6  PROT 7.2 5.5* 5.4*  ALBUMIN 3.9 2.8* 2.5*    No results for input(s): "LIPASE", "AMYLASE" in the last 168 hours. No results for input(s): "AMMONIA" in the last 168 hours. Coagulation Profile: Recent Labs  Lab 06/02/22 2110  INR 1.1    Cardiac Enzymes: No results for input(s): "CKTOTAL", "CKMB", "CKMBINDEX", "TROPONINI" in the last 168 hours. BNP (last 3 results) No results for input(s): "PROBNP" in the last 8760 hours. HbA1C: No results for input(s): "HGBA1C" in the last 72  hours. CBG: No results for input(s): "GLUCAP" in the last 168 hours. Lipid Profile: No results for input(s): "CHOL", "HDL", "LDLCALC", "TRIG", "CHOLHDL", "LDLDIRECT" in the last 72 hours. Thyroid Function Tests: Recent Labs    06/03/22 0813  TSH 0.572   Anemia Panel: Recent Labs    06/03/22 0813  VITAMINB12 363  FOLATE 6.1  FERRITIN 5*  TIBC 312  IRON 21*   Sepsis Labs: Recent Labs  Lab 06/02/22 2109 06/03/22 0017 06/03/22 0808 06/03/22 1206  LATICACIDVEN 3.4* 8.7* 1.8 1.8     Recent Results (from the past 240 hour(s))  Resp panel by RT-PCR (RSV, Flu A&B, Covid) Anterior Nasal Swab     Status: None   Collection Time: 06/02/22  9:10 PM   Specimen: Anterior Nasal Swab  Result Value Ref Range Status   SARS Coronavirus 2 by RT PCR NEGATIVE NEGATIVE Final    Comment: (NOTE) SARS-CoV-2 target nucleic acids  are NOT DETECTED.  The SARS-CoV-2 RNA is generally detectable in upper respiratory specimens during the acute phase of infection. The lowest concentration of SARS-CoV-2 viral copies this assay can detect is 138 copies/mL. A negative result does not preclude SARS-Cov-2 infection and should not be used as the sole basis for treatment or other patient management decisions. A negative result may occur with  improper specimen collection/handling, submission of specimen other than nasopharyngeal swab, presence of viral mutation(s) within the areas targeted by this assay, and inadequate number of viral copies(<138 copies/mL). A negative result must be combined with clinical observations, patient history, and epidemiological information. The expected result is Negative.  Fact Sheet for Patients:  EntrepreneurPulse.com.au  Fact Sheet for Healthcare Providers:  IncredibleEmployment.be  This test is no t yet approved or cleared by the [place] FDA and  has been authorized for detection and/or diagnosis of SARS-CoV-2 by FDA under  an Emergency Use Authorization (EUA). This EUA will remain  in effect (meaning this test can be used) for the duration of the COVID-19 declaration under Section 564(b)(1) of the Act, 21 U.S.C.section 360bbb-3(b)(1), unless the authorization is terminated  or revoked sooner.       Influenza A by PCR NEGATIVE NEGATIVE Final   Influenza B by PCR NEGATIVE NEGATIVE Final    Comment: (NOTE) The Xpert Xpress SARS-CoV-2/FLU/RSV plus assay is intended as an aid in the diagnosis of influenza from Nasopharyngeal swab specimens and should not be used as a sole basis for treatment. Nasal washings and aspirates are unacceptable for Xpert Xpress SARS-CoV-2/FLU/RSV testing.  Fact Sheet for Patients: EntrepreneurPulse.com.au  Fact Sheet for Healthcare Providers: IncredibleEmployment.be  This test is not yet approved or cleared by the [place] FDA and has been authorized for detection and/or diagnosis of SARS-CoV-2 by FDA under an Emergency Use Authorization (EUA). This EUA will remain in effect (meaning this test can be used) for the duration of the COVID-19 declaration under Section 564(b)(1) of the Act, 21 U.S.C. section 360bbb-3(b)(1), unless the authorization is terminated or revoked.     Resp Syncytial Virus by PCR NEGATIVE NEGATIVE Final    Comment: (NOTE) Fact Sheet for Patients: EntrepreneurPulse.com.au  Fact Sheet for Healthcare Providers: IncredibleEmployment.be  This test is not yet approved or cleared by the [place] FDA and has been authorized for detection and/or diagnosis of SARS-CoV-2 by FDA under an Emergency Use Authorization (EUA). This EUA will remain in effect (meaning this test can be used) for the duration of the COVID-19 declaration under Section 564(b)(1) of the Act, 21 U.S.C. section 360bbb-3(b)(1), unless the authorization is terminated or revoked.  Performed at [facility], [address], [place], [place] [address]   Blood Culture (routine x 2)     Status: None (Preliminary result)   Collection Time: 06/03/22  2:33 AM   Specimen: BLOOD LEFT ARM  Result Value Ref Range Status   Specimen Description BLOOD LEFT ARM  Final   Special Requests   Final    BOTTLES DRAWN AEROBIC ONLY Blood Culture adequate volume   Culture   Final    NO GROWTH 1 DAY Performed at [facility], [address], [place], [place] [address]    Report Status PENDING  Incomplete  MRSA Next Gen by PCR, Nasal     Status: None   Collection Time: 06/03/22  2:42 AM   Specimen: Nasal Mucosa; Nasal Swab  Result Value Ref Range Status   MRSA by PCR Next Gen NOT DETECTED  NOT DETECTED Final    Comment: (NOTE) The GeneXpert MRSA Assay (FDA approved for NASAL specimens only), is one component of a comprehensive MRSA colonization surveillance program. It is not intended to diagnose MRSA infection nor to guide or monitor treatment for MRSA infections. Test performance is not FDA approved in patients less than 75 years old. Performed at [facility], [place] [address] [place], [place] [address]   Urine Culture     Status: Abnormal   Collection Time: 06/03/22  5:57 AM   Specimen: Urine, Random  Result Value Ref Range Status   Specimen Description   Final    URINE, RANDOM Performed at [facility] [address] [place], [place] [address]    Special Requests   Final    NONE Reflexed from [id number] Performed at [facility], [place] [address] [place], [place] [address]    Culture (A)  Final    20,000 COLONIES/mL MULTIPLE SPECIES PRESENT, SUGGEST RECOLLECTION   Report Status 06/04/2022 FINAL  Final      Radiology Studies: [place] EKG SITE RITE  Result Date: 06/03/2022 If Site Rite image not attached, placement could not be confirmed due to current cardiac rhythm.  CT ABDOMEN PELVIS WO CONTRAST  Result Date:  06/02/2022 CLINICAL DATA:  Abdomen pain decreased appetite EXAM: CT ABDOMEN AND PELVIS WITHOUT CONTRAST TECHNIQUE: Multidetector CT imaging of the abdomen and pelvis was performed following the standard protocol without IV contrast. RADIATION DOSE REDUCTION: This exam was performed according to the departmental dose-optimization program which includes automated exposure control, adjustment of the mA and/or kV according to patient size and/or use of iterative reconstruction technique. COMPARISON:  CT 07/11/2005 FINDINGS: Lower chest: Lung bases demonstrate emphysema. Mild bronchiectasis and scarring in the left lung base. Hepatobiliary: No calcified gallstone or biliary dilatation. No gross focal hepatic abnormality without contrast Pancreas: Negative for inflammation Spleen: Grossly negative Adrenals/Urinary Tract: Adrenal glands are within normal limits. Kidneys show no hydronephrosis. Slightly thick-walled appearance of the bladder. 14 mm stone within the right posterior bladder. Pedunculated mass measuring 5.3 by 3.7 cm, possibly contiguous with prostate. Stomach/Bowel: Limited assessment due to absence of contrast, and cachectic-appearing patient with lack of intra-abdominal fat and poor contrast. No gross bowel distension or bowel wall thickening. Fluid-filled nondilated bowel in the abdomen and pelvis Vascular/Lymphatic: Advanced aortic atherosclerosis. No aneurysm. No grossly enlarged lymph nodes. Reproductive: Enlarged prostate possibly contiguous with a pedunculated bladder mass. Other: Negative for pelvic effusion or free air. Musculoskeletal: Age indeterminate moderate superior endplate compression fractures at T12 and L1. IMPRESSION: 1. Markedly limited exam secondary to absence of intra-abdominal fat/cachectic-appearing patient and absence of oral or intravenous contrast. 2. Slightly thick-walled appearance of the urinary bladder, possible chronic obstruction versus cystitis. Large 5.3 cm pedunculated  mass within the bladder lumen, possibly contiguous with enlarged prostate. Recommend direct visualization. 14 mm bladder stone 3. Emphysema Electronically Signed   By: Donavan Foil M.D.   On: 06/02/2022 23:29   DG Chest Port 1 View  Result Date: 06/02/2022 CLINICAL DATA:  Possible sepsis.  Weight loss. EXAM: PORTABLE CHEST 1 VIEW COMPARISON:  09/17/2018. FINDINGS: The heart size and mediastinal contours are within normal limits. There is atherosclerotic calcification of the lungs. Hyperinflation of the lungs is noted bilaterally. No consolidation, effusion, or pneumothorax. Bilateral nipple shadows are noted and seen on previous exams. No acute osseous abnormality. IMPRESSION: 1. No active disease. 2. Hyperinflation of the lungs. Electronically Signed   By: Regan Rakers.D.  On: 06/02/2022 20:38    Scheduled Meds:  acamprosate  333 mg Oral TID WC   Chlorhexidine Gluconate Cloth  6 each Topical Daily   donepezil  10 mg Oral QHS   feeding supplement  237 mL Oral TID BM   naltrexone  50 mg Oral Daily   mouth rinse  15 mL Mouth Rinse 4 times per day   sodium chloride flush  10-40 mL Intracatheter Q12H   Continuous Infusions:  cefTRIAXone (ROCEPHIN)  IV Stopped (06/03/22 2104)     LOS: 1 day   Time spent: 35 minutes   Gabbie Marzo Loann Quill, MD [facility]  If 7PM-7AM, please contact night-coverage [email or web address] 06/04/2022, 10:44 AM

## 2022-06-05 DIAGNOSIS — N179 Acute kidney failure, unspecified: Secondary | ICD-10-CM | POA: Diagnosis not present

## 2022-06-05 LAB — BASIC METABOLIC PANEL
Anion gap: 7 (ref 5–15)
BUN: 23 mg/dL (ref 8–23)
CO2: 18 mmol/L — ABNORMAL LOW (ref 22–32)
Calcium: 7.4 mg/dL — ABNORMAL LOW (ref 8.9–10.3)
Chloride: 112 mmol/L — ABNORMAL HIGH (ref 98–111)
Creatinine, Ser: 1.29 mg/dL — ABNORMAL HIGH (ref 0.61–1.24)
GFR, Estimated: 56 mL/min — ABNORMAL LOW (ref >60)
Glucose, Bld: 79 mg/dL (ref 70–99)
Potassium: 4 mmol/L (ref 3.5–5.1)
Sodium: 137 mmol/L (ref 135–145)

## 2022-06-05 LAB — CBC
HCT: 26.7 % — ABNORMAL LOW (ref 39.0–52.0)
Hemoglobin: 8.1 g/dL — ABNORMAL LOW (ref 13.0–17.0)
MCH: 22.5 pg — ABNORMAL LOW (ref 26.0–34.0)
MCHC: 30.3 g/dL (ref 30.0–36.0)
MCV: 74.2 fL — ABNORMAL LOW (ref 80.0–100.0)
Platelets: 229 10*3/uL (ref 150–400)
RBC: 3.6 MIL/uL — ABNORMAL LOW (ref 4.22–5.81)
RDW: 22.5 % — ABNORMAL HIGH (ref 11.5–15.5)
WBC: 7.3 10*3/uL (ref 4.0–10.5)
nRBC: 0 % (ref 0.0–0.2)

## 2022-06-05 LAB — GLUCOSE, CAPILLARY
Glucose-Capillary: 70 mg/dL (ref 70–99)
Glucose-Capillary: 110 mg/dL — ABNORMAL HIGH (ref 70–99)

## 2022-06-05 MED ORDER — ADULT MULTIVITAMIN W/MINERALS CH
1.0000 | ORAL_TABLET | Freq: Every day | ORAL | Status: DC
  Administered 2022-06-06 – 2022-06-07 (×2): 1 via ORAL
  Filled 2022-06-05: qty 1

## 2022-06-05 NOTE — Progress Notes (Signed)
Report called to [facility] on 6 east. Patient will be transported shortly.

## 2022-06-05 NOTE — Progress Notes (Signed)
Nutrition Follow-up  DOCUMENTATION CODES:   Severe malnutrition in context of chronic illness  INTERVENTION:  - Regular diet.  - Automatic house trays to ensure patient receives 3 meals a day.  - Ensure Plus High Protein po TID, each supplement provides 350 kcal and 20 grams of protein. - Encourage intake at all meals and of supplements.   - 48 Hour calorie count to run 2/19-2/20.   - Please document % intake of all food, drinks, and supplements patient consumes.   - Plan discussed with RN.   - Will order daily multivitamin to support micronutrient needs.  - Monitor weight trends.    NUTRITION DIAGNOSIS:   Severe Malnutrition related to chronic illness (COPD, dementia) as evidenced by severe fat depletion, severe muscle depletion. *new  GOAL:   Patient will meet greater than or equal to 90% of their needs *unmet  MONITOR:   PO intake, Supplement acceptance, Weight trends  REASON FOR ASSESSMENT:   Malnutrition Screening Tool, Consult Assessment of nutrition requirement/status, Calorie Count, Poor PO  ASSESSMENT:   82 y.o. male with medical history of peripheral arterial disease, COPD, alcohol abuse, cardiomyopathy, seizures, dementia, HTN, afib, arthritis, prior hx of AV malformation of the colon. He was brought to the ED after he was found to be not eating well as seen by the family.  Patient resting in bed at time of visit. He reports a UBW of "a hundred something" but otherwise unsure. Did not feel he had lost any weight recently.  Per EMR, no weight history since July 2020 to assess changes in the past year.   Patient reports eating 3 meals a day PTA and drinking a lot of lemonade. However, pt noted to be brought to the ED after family concern he hasn't been eating well.   Patient is documented to have had 80% of breakfast yesterday but only 20% of dinner. RN reports patient had 20% of breakfast this morning but also had 1 Ensure. Patient reports enjoying the  Ensure. Admits his appetite is poor. Discussed importance of trying to eat 3 meals a day and drinking Ensure. Patient is receiving automatic house trays to ensure to receives 3 meals a day.   Per discussion with MD plan for calorie count. Will start 48 hour calorie count today, discussed plan with RN.    Medications reviewed and include: -  Labs reviewed:  Creatinine 1.29   NUTRITION - FOCUSED PHYSICAL EXAM:  Flowsheet Row Most Recent Value  Orbital Region Mild depletion  Upper Arm Region Severe depletion  Thoracic and Lumbar Region Severe depletion  Buccal Region Severe depletion  Temple Region Severe depletion  Clavicle Bone Region Severe depletion  Clavicle and Acromion Bone Region Severe depletion  Scapular Bone Region Unable to assess  Dorsal Hand Severe depletion  Patellar Region Severe depletion  Anterior Thigh Region Severe depletion  Posterior Calf Region Severe depletion  Edema (RD Assessment) None  Hair Reviewed  Eyes Reviewed  Mouth Reviewed  Skin Reviewed  Nails Reviewed       Diet Order:   Diet Order             Diet regular Room service appropriate? Yes; Fluid consistency: Thin  Diet effective now                   EDUCATION NEEDS:  Education needs have been addressed  Skin:  Skin Assessment: Reviewed RN Assessment  Last BM:  2/19  Height:  Ht Readings from Last 1 Encounters:  06/03/22 5' 7.01" (1.702 m)   Weight:  Wt Readings from Last 1 Encounters:  06/03/22 44.9 kg   Ideal Body Weight:  67.3 kg  BMI:  Body mass index is 15.5 kg/m.  Estimated Nutritional Needs:  Kcal:  1570-1800 kcal Protein:  75-90 grams Fluid:  >/= 1.7 L/day    Samson Frederic RD, LDN For contact information, refer to [facility].

## 2022-06-05 NOTE — TOC Progression Note (Signed)
Transition of Care [facility]) - Progression Note    Patient Details  Name: Leon Foster MRN: [id number] Date of Birth: 06-23-1940  Transition of Care [facility]) CM/SW [place], RN Phone [phone]  06/05/2022, 3:54 PM  Clinical Narrative:    TOC acknowledges patient admitted from ALF to return to ALF with home health services. TOC to follow up with facility for home health agency.         Expected Discharge Plan and Services                                               Social Determinants of Health (SDOH) Interventions SDOH Screenings   Food Insecurity: No Food Insecurity (06/03/2022)  Housing: Low Risk  (06/03/2022)  Transportation Needs: No Transportation Needs (06/03/2022)  Utilities: Not At Risk (06/03/2022)  Tobacco Use: High Risk (06/03/2022)    Readmission Risk Interventions     No data to display

## 2022-06-05 NOTE — Progress Notes (Signed)
PROGRESS NOTE    Leon Foster  [id number] DOB: [birth year] DOA: 06/02/2022 PCP: [name], No   Brief Narrative:   Leon Foster is a 82 y.o. male with history of peripheral arterial disease, COPD, alcohol abuse, prior history of AV malformation of the colon was brought to the ER after patient was found to be not eating well as seen by the family.     ED Course: In the ER patient was found to be hypotensive with a systolic in the [id number] lactic acid was elevated at 3.  Labs also showed creatinine of 3.6 which increased from 1.2 about 3 years ago on 7/20.  Hemoglobin has dropped by 4 g from previous of 14 and is 9.7.  Stool for occult blood was negative.  Chest x-ray unremarkable CT abdomen pelvis done shows bladder mass.  Patient was placed on fluids empiric antibiotics admitted for further management.    Assessment & Plan:  Acute kidney failure: -Likely from poor p.o. intake.  Creatinine: 3.63 upon arrival.  Baseline 1.1-1.2.  CT abdomen concerning for chronic obstruction versus cystitis.  Large 5.3 cm mass within the bladder lumen possible contiguous with enlarged prostate.  No hydronephrosis or pyelonephritis. -Renal function improving.  Creatinine: 1.6.  Continue IV fluids.  Encourage p.o. intake -Strict INO's and daily weight. -Transfer patient to regular floor.  Consulted PT for evaluation.  Hypotensive Lactic acidosis: UTI -Likely poor p.o. intake.  Blood pressure improved with IV fluids.  Lactic acid trended down to normal -Continue IV fluids. -UA concerning for infection.    Urine culture positive for multiple species, suggest recollection.. -Patient started on cefepime and vancomycin upon arrival. Narrowed  it down to Rocephin.  He is currently afebrile with no leukocytosis.  Will continue Rocephin x 3 days.  Microcytic anemia: -Dietary versus GI source -H&H 9.7/31.3: Was 14.0/42.83 years ago.  FOBT negative.  Has history of AVM in colon per chart -Will continue to monitor  hemoglobin closely.  Normal B12 and folate. -Iron panel suggestive of iron deficiency.  Started on iron infusion.   -H&H have improved.  History of PAD: On Plavix.  Will continue to hold for now  COPD: On room air.  Not in acute exacerbation.  Prior history of alcohol abuse: On acamprosate and naltrexone  Bladder mass: Noted on CT abdomen/pelvis. -Could be enlarged prostate. -Needs outpatient follow-up with urology  History of dementia: Aware  Severe protein calorie malnutrition: -BMI of 15.  Albumin of 2.8 -Appreciate dietitian recommendations  Sinus bradycardia: -Heart rate is in 40s and 50s.  TSH: WNL -Troponin: WNL.  EKG showed sinus bradycardia.-Continue to monitor on telemetry.   -Echo shows normal ejection fraction with no wall motion abnormality. -Heart rate and BP: Improving.  DVT prophylaxis: SCD Code Status: Full code Family Communication:  None present at bedside.  Plan of care discussed with patient in length and he verbalized understanding and agreed with it.  2/18: I called patient's sister and updated her about patient's plan of care and she verbalized understanding.  Disposition Plan: Back to ALF in 1 to 2 days.  Awaiting PT evaluation Consultants:  None  Procedures:  None  Antimicrobials:  Cefepime Vancomycin Switched to Rocephin on 2/17  Status is: Inpatient      Subjective: Patient seen and examined.  Resting comfortably on the bed.  Denies any complaints.  Remained afebrile.  No nausea, vomiting, diarrhea.  Had 1 bowel movement yesterday.  No acute events overnight.  Objective: Vitals:   06/05/22 0800  06/05/22 0900 06/05/22 1000 06/05/22 1008  BP: 119/68 122/68 116/62   Pulse: 75 (!) 109 65 64  Resp: 12 14 14 15  $ Temp: 98.3 F (36.8 C)     TempSrc: Oral     SpO2: 100% 90% (!) 87% 96%  Weight:      Height:        Intake/Output Summary (Last 24 hours) at 06/05/2022 1043 Last data filed at 06/05/2022 0800 Gross per 24 hour  Intake  2539.04 ml  Output 850 ml  Net 1689.04 ml    Filed Weights   06/03/22 0200  Weight: 44.9 kg    Examination:  General exam: Appears calm and comfortable, on room air, very cachectic.  Poor dentition respiratory system: Clear to auscultation. Respiratory effort normal. Cardiovascular system: S1 & S2 heard, RRR. No JVD, murmurs, rubs, gallops or clicks. No pedal edema. Gastrointestinal system: Abdomen is nondistended, soft and nontender. No organomegaly or masses felt. Normal bowel sounds heard. Central nervous system: Alert and oriented. No focal neurological deficits. Extremities: Symmetric 5 x 5 power.  Left transmetatarsal amputation. Skin: No rashes, lesions or ulcers Psychiatry: Judgement and insight appear normal. Mood & affect appropriate.    Data Reviewed: I have personally reviewed following labs and imaging studies  CBC: Recent Labs  Lab 06/02/22 2021 06/03/22 0813 06/04/22 0649 06/05/22 0600  WBC 5.4 5.9 6.8 7.3  NEUTROABS 2.3 4.1  --   --   HGB 9.7* 8.1* 7.9* 8.1*  HCT 31.3* 26.8* 26.2* 26.7*  MCV 70.8* 72.2* 74.0* 74.2*  PLT 348 283 247 [id number]    Basic Metabolic Panel: Recent Labs  Lab 06/02/22 2021 06/03/22 0659 06/04/22 0649 06/05/22 0600  NA 138 137 137 137  K 3.8 4.2 4.2 4.0  CL 104 105 113* 112*  CO2 19* 23 20* 18*  GLUCOSE 108* 83 84 79  BUN 58* 51* 34* 23  CREATININE 3.63* 2.86* 1.60* 1.29*  CALCIUM 9.3 8.1* 7.3* 7.4*  MG  --  2.3  --   --     GFR: Estimated Creatinine Clearance: 29 mL/min (A) (by C-G formula based on SCr of 1.29 mg/dL (H)). Liver Function Tests: Recent Labs  Lab 06/02/22 2021 06/03/22 0659 06/04/22 0649  AST 10* 12* 12*  ALT 5 6 6  $ ALKPHOS 42 35* 38  BILITOT 0.6 0.8 0.6  PROT 7.2 5.5* 5.4*  ALBUMIN 3.9 2.8* 2.5*    No results for input(s): "LIPASE", "AMYLASE" in the last 168 hours. No results for input(s): "AMMONIA" in the last 168 hours. Coagulation Profile: Recent Labs  Lab 06/02/22 2110  INR 1.1     Cardiac Enzymes: No results for input(s): "CKTOTAL", "CKMB", "CKMBINDEX", "TROPONINI" in the last 168 hours. BNP (last 3 results) No results for input(s): "PROBNP" in the last 8760 hours. HbA1C: No results for input(s): "HGBA1C" in the last 72 hours. CBG: Recent Labs  Lab 06/04/22 2354 06/05/22 0544  GLUCAP 72 70   Lipid Profile: No results for input(s): "CHOL", "HDL", "LDLCALC", "TRIG", "CHOLHDL", "LDLDIRECT" in the last 72 hours. Thyroid Function Tests: Recent Labs    06/03/22 0813  TSH 0.572    Anemia Panel: Recent Labs    06/03/22 0813  VITAMINB12 363  FOLATE 6.1  FERRITIN 5*  TIBC 312  IRON 21*    Sepsis Labs: Recent Labs  Lab 06/02/22 2109 06/03/22 0017 06/03/22 0808 06/03/22 1206  LATICACIDVEN 3.4* 8.7* 1.8 1.8     Recent Results (from the past 240 hour(s))  Resp panel  by RT-PCR (RSV, Flu A&B, Covid) Anterior Nasal Swab     Status: None   Collection Time: 06/02/22  9:10 PM   Specimen: Anterior Nasal Swab  Result Value Ref Range Status   SARS Coronavirus 2 by RT PCR NEGATIVE NEGATIVE Final    Comment: (NOTE) SARS-CoV-2 target nucleic acids are NOT DETECTED.  The SARS-CoV-2 RNA is generally detectable in upper respiratory specimens during the acute phase of infection. The lowest concentration of SARS-CoV-2 viral copies this assay can detect is 138 copies/mL. A negative result does not preclude SARS-Cov-2 infection and should not be used as the sole basis for treatment or other patient management decisions. A negative result may occur with  improper specimen collection/handling, submission of specimen other than nasopharyngeal swab, presence of viral mutation(s) within the areas targeted by this assay, and inadequate number of viral copies(<138 copies/mL). A negative result must be combined with clinical observations, patient history, and epidemiological information. The expected result is Negative.  Fact Sheet for Patients:   EntrepreneurPulse.com.au  Fact Sheet for Healthcare Providers:  IncredibleEmployment.be  This test is no t yet approved or cleared by the [place] FDA and  has been authorized for detection and/or diagnosis of SARS-CoV-2 by FDA under an Emergency Use Authorization (EUA). This EUA will remain  in effect (meaning this test can be used) for the duration of the COVID-19 declaration under Section 564(b)(1) of the Act, 21 U.S.C.section 360bbb-3(b)(1), unless the authorization is terminated  or revoked sooner.       Influenza A by PCR NEGATIVE NEGATIVE Final   Influenza B by PCR NEGATIVE NEGATIVE Final    Comment: (NOTE) The Xpert Xpress SARS-CoV-2/FLU/RSV plus assay is intended as an aid in the diagnosis of influenza from Nasopharyngeal swab specimens and should not be used as a sole basis for treatment. Nasal washings and aspirates are unacceptable for Xpert Xpress SARS-CoV-2/FLU/RSV testing.  Fact Sheet for Patients: EntrepreneurPulse.com.au  Fact Sheet for Healthcare Providers: IncredibleEmployment.be  This test is not yet approved or cleared by the [place] FDA and has been authorized for detection and/or diagnosis of SARS-CoV-2 by FDA under an Emergency Use Authorization (EUA). This EUA will remain in effect (meaning this test can be used) for the duration of the COVID-19 declaration under Section 564(b)(1) of the Act, 21 U.S.C. section 360bbb-3(b)(1), unless the authorization is terminated or revoked.     Resp Syncytial Virus by PCR NEGATIVE NEGATIVE Final    Comment: (NOTE) Fact Sheet for Patients: EntrepreneurPulse.com.au  Fact Sheet for Healthcare Providers: IncredibleEmployment.be  This test is not yet approved or cleared by the [place] FDA and has been authorized for detection and/or diagnosis of SARS-CoV-2 by FDA under an Emergency Use  Authorization (EUA). This EUA will remain in effect (meaning this test can be used) for the duration of the COVID-19 declaration under Section 564(b)(1) of the Act, 21 U.S.C. section 360bbb-3(b)(1), unless the authorization is terminated or revoked.  Performed at [facility], [address], [place], [place] [address]   Blood Culture (routine x 2)     Status: None (Preliminary result)   Collection Time: 06/03/22  2:33 AM   Specimen: BLOOD LEFT ARM  Result Value Ref Range Status   Specimen Description BLOOD LEFT ARM  Final   Special Requests   Final    BOTTLES DRAWN AEROBIC ONLY Blood Culture adequate volume   Culture   Final    NO GROWTH 2 DAYS Performed at [facility], [address] [place], [place] [address]    Report Status PENDING  Incomplete  MRSA Next Gen by PCR, Nasal     Status: None   Collection Time: 06/03/22  2:42 AM   Specimen: Nasal Mucosa; Nasal Swab  Result Value Ref Range Status   MRSA by PCR Next Gen NOT DETECTED NOT DETECTED Final    Comment: (NOTE) The GeneXpert MRSA Assay (FDA approved for NASAL specimens only), is one component of a comprehensive MRSA colonization surveillance program. It is not intended to diagnose MRSA infection nor to guide or monitor treatment for MRSA infections. Test performance is not FDA approved in patients less than 8 years old. Performed at [facility], [place] [address], [place], [place] [address]   Culture, blood (Routine X 2) w Reflex to ID Panel     Status: None (Preliminary result)   Collection Time: 06/03/22  4:40 AM   Specimen: BLOOD  Result Value Ref Range Status   Specimen Description   Final    BLOOD BLOOD RIGHT ARM Performed at [facility], [address], [place], [place] [address]    Special Requests   Final    BOTTLES DRAWN AEROBIC AND ANAEROBIC Blood Culture adequate volume Performed at [facility], [address],  [place], [place] [address]    Culture   Final    NO GROWTH 2 DAYS Performed at [facility], [place] [address] [place], [place] [address]    Report Status PENDING  Incomplete  Urine Culture     Status: Abnormal   Collection Time: 06/03/22  5:57 AM   Specimen: Urine, Random  Result Value Ref Range Status   Specimen Description   Final    URINE, RANDOM Performed at [facility] [address], [place], [place] [address]    Special Requests   Final    NONE Reflexed from [phone] Performed at [facility], [place] [address], [place], [place] [address]    Culture (A)  Final    20,000 COLONIES/mL MULTIPLE SPECIES PRESENT, SUGGEST RECOLLECTION   Report Status 06/04/2022 FINAL  Final      Radiology Studies: ECHOCARDIOGRAM COMPLETE  Result Date: 06/04/2022    ECHOCARDIOGRAM REPORT   Patient Name:   Leon Foster Date of Exam: 06/04/2022 Medical Rec #:  [id number]       Height:       67.0 in Accession #:    [id number]      Weight:       99.0 lb Date of Birth:  09-Feb-1941       BSA:          1.500 m Patient Age:    3 years        BP:           112/60 mmHg Patient Gender: M               HR:           72 bpm. Exam Location:  Inpatient Procedure: 2D Echo, Cardiac Doppler and Color Doppler Indications:    R94.31 Abnormal EKG  History:        Patient has prior history of Echocardiogram examinations.                 Cardiomyopathy, COPD, Arrythmias:Atrial Fibrillation; Risk                 Factors:Hypertension.  Sonographer:    Phineas Douglas Referring Phys: [id number] Michell Heinrich  Hassell Patras IMPRESSIONS  1. Poor image quality no apical windows.  2. Left ventricular ejection fraction, by estimation, is 60 to 65%. The left ventricle has normal function. The left ventricle has no regional wall motion abnormalities. Left ventricular diastolic parameters are indeterminate.  3. Right ventricular systolic function is mildly reduced. The right ventricular size is mildly enlarged. There is  mildly elevated pulmonary artery systolic pressure.  4. Right atrial size was moderately dilated.  5. The mitral valve is abnormal. No evidence of mitral valve regurgitation. No evidence of mitral stenosis. Moderate mitral annular calcification.  6. Tricuspid valve regurgitation is moderate.  7. The aortic valve is tricuspid. There is mild calcification of the aortic valve. There is mild thickening of the aortic valve. Aortic valve regurgitation is trivial. Aortic valve sclerosis is present, with no evidence of aortic valve stenosis.  8. The inferior vena cava is dilated in size with <50% respiratory variability, suggesting right atrial pressure of 15 mmHg. FINDINGS  Left Ventricle: Left ventricular ejection fraction, by estimation, is 60 to 65%. The left ventricle has normal function. The left ventricle has no regional wall motion abnormalities. The left ventricular internal cavity size was normal in size. There is  no left ventricular hypertrophy. Left ventricular diastolic parameters are indeterminate. Right Ventricle: The right ventricular size is mildly enlarged. No increase in right ventricular wall thickness. Right ventricular systolic function is mildly reduced. There is mildly elevated pulmonary artery systolic pressure. The tricuspid regurgitant  velocity is 2.64 m/s, and with an assumed right atrial pressure of 15 mmHg, the estimated right ventricular systolic pressure is 0000000 mmHg. Left Atrium: Left atrial size was normal in size. Right Atrium: Right atrial size was moderately dilated. Pericardium: Trivial pericardial effusion is present. The pericardial effusion is anterior to the right ventricle. Mitral Valve: The mitral valve is abnormal. There is mild thickening of the mitral valve leaflet(s). There is mild calcification of the mitral valve leaflet(s). Moderate mitral annular calcification. No evidence of mitral valve regurgitation. No evidence  of mitral valve stenosis. Tricuspid Valve: The  tricuspid valve is normal in structure. Tricuspid valve regurgitation is moderate . No evidence of tricuspid stenosis. Aortic Valve: The aortic valve is tricuspid. There is mild calcification of the aortic valve. There is mild thickening of the aortic valve. Aortic valve regurgitation is trivial. Aortic valve sclerosis is present, with no evidence of aortic valve stenosis. Pulmonic Valve: The pulmonic valve was normal in structure. Pulmonic valve regurgitation is not visualized. No evidence of pulmonic stenosis. Aorta: The aortic root is normal in size and structure. Venous: The inferior vena cava is dilated in size with less than 50% respiratory variability, suggesting right atrial pressure of 15 mmHg. IAS/Shunts: No atrial level shunt detected by color flow Doppler. Additional Comments: Poor image quality no apical windows.  LEFT VENTRICLE PLAX 2D LVIDd:         4.30 cm   Diastology LVIDs:         2.90 cm   LV e' medial:  5.77 cm/s LV PW:         1.10 cm   LV e' lateral: 11.20 cm/s LV IVS:        0.80 cm LVOT diam:     2.20 cm LVOT Area:     3.80 cm  RIGHT VENTRICLE             IVC RV Basal diam:  4.60 cm     IVC diam: 2.40 cm RV S prime:  11.70 cm/s LEFT ATRIUM           Index        RIGHT ATRIUM           Index LA diam:      3.00 cm 2.00 cm/m   RA Area:     21.70 cm LA Vol (A4C): 40.8 ml 27.20 ml/m  RA Volume:   71.60 ml  47.74 ml/m   AORTA Ao Root diam: 3.30 cm Ao Asc diam:  3.00 cm TRICUSPID VALVE TR Peak grad:   27.9 mmHg TR Vmax:        264.00 cm/s  SHUNTS Systemic Diam: 2.20 cm Jenkins Rouge MD Electronically signed by Jenkins Rouge MD Signature Date/Time: 06/04/2022/11:01:05 AM    Final     Scheduled Meds:  acamprosate  333 mg Oral TID WC   Chlorhexidine Gluconate Cloth  6 each Topical Daily   donepezil  10 mg Oral QHS   feeding supplement  237 mL Oral TID BM   naltrexone  50 mg Oral Daily   mouth rinse  15 mL Mouth Rinse 4 times per day   sodium chloride flush  10-40 mL Intracatheter Q12H    Continuous Infusions:  sodium chloride 100 mL/hr at 06/05/22 0800   cefTRIAXone (ROCEPHIN)  IV Stopped (06/04/22 2130)   iron sucrose Stopped (06/04/22 1753)     LOS: 2 days   Time spent: 35 minutes   Jaylan Duggar Loann Quill, MD [facility]  If 7PM-7AM, please contact night-coverage [email or web address] 06/05/2022, 10:43 AM

## 2022-06-05 NOTE — Evaluation (Signed)
Physical Therapy Evaluation Patient Details Name: Leon Foster MRN: [id number] DOB: [birth year] Today's Date: 06/05/2022  History of Present Illness  Pt is 82 yo male presented to hospital on 06/02/22 with poor PO intake at ALF. Pt admitted with acute kidney failure and hypotension.  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  Clinical Impression  Pt admitted with above diagnosis. At baseline, pt is from ALF and is independent and ambulates without AD. Today, pt was able to transfer with min guard and took a few steps in room without AD.  Limited distance due to lunch present and pt willing/ready to eat.  Pt expected to progress well with therapy. Pt currently with functional limitations due to the deficits listed below (see PT Problem List). Pt will benefit from skilled PT to increase their independence and safety with mobility to allow discharge to the venue listed below.          Recommendations for follow up therapy are one component of a multi-disciplinary discharge planning process, led by the attending physician.  Recommendations may be updated based on patient status, additional functional criteria and insurance authorization.  Follow Up Recommendations Home health PT (return to ALF with HHPT)      Assistance Recommended at Discharge Frequent or constant Supervision/Assistance  Patient can return home with the following  A little help with walking and/or transfers;A little help with bathing/dressing/bathroom;Assistance with cooking/housework;Help with stairs or ramp for entrance    Equipment Recommendations None recommended by PT  Recommendations for Other Services       Functional Status Assessment Patient has had a recent decline in their functional status and demonstrates the ability to make significant improvements in function in a reasonable  and predictable amount of time.     Precautions / Restrictions Precautions Precautions: Fall      Mobility  Bed Mobility Overal bed mobility: Needs Assistance Bed Mobility: Supine to Sit, Sit to Supine     Supine to sit: Supervision Sit to supine: Supervision        Transfers Overall transfer level: Needs assistance Equipment used: 1 person hand held assist Transfers: Sit to/from Stand Sit to Stand: Min guard           General transfer comment: Min guard for safety; performed x 3    Ambulation/Gait Ambulation/Gait assistance: Min guard Gait Distance (Feet): 5 Feet Assistive device: 1 person hand held assist Gait Pattern/deviations: Step-to pattern, Decreased stride length, Trunk flexed Gait velocity: decreased     General Gait Details: Small steps to chair; min guard for safety and lines; distance limited due to food present and pt willing to eat  Stairs            Wheelchair Mobility    Modified Rankin (Stroke Patients Only)       Balance Overall balance assessment: Needs assistance Sitting-balance support: No upper extremity supported Sitting balance-Leahy Scale: Good     Standing balance support: No upper extremity supported Standing balance-Leahy Scale: Fair Standing balance comment: Pt had small BM and was able to stand and wipe himself with superivison for safety                             Pertinent Vitals/Pain Pain Assessment Pain Assessment: No/denies pain    Home Living Family/patient expects to be discharged to::  Assisted living                 Home Equipment: None      Prior Function Prior Level of Function : Independent/Modified Independent             Mobility Comments: Brother reports pt could ambulate throughout facility without AD; no hx of falls ADLs Comments: case manager spoke with facility who report pt was independent     Hand Dominance        Extremity/Trunk Assessment   Upper  Extremity Assessment Upper Extremity Assessment: Generalized weakness;Difficult to assess due to impaired cognition    Lower Extremity Assessment Lower Extremity Assessment: Generalized weakness;Difficult to assess due to impaired cognition    Cervical / Trunk Assessment Cervical / Trunk Assessment: Other exceptions Cervical / Trunk Exceptions: Pt with impaired cognition.  No obvious deficits in extremities other than generalized weakness  Communication   Communication: HOH  Cognition Arousal/Alertness: Awake/alert Behavior During Therapy: WFL for tasks assessed/performed Overall Cognitive Status: History of cognitive impairments - at baseline                                          General Comments General comments (skin integrity, edema, etc.): Pt was on 2 L O2.  Unable to get O2 reading but all other VSS and pt does not appear short of breath    Exercises     Assessment/Plan    PT Assessment Patient needs continued PT services  PT Problem List Decreased strength;Cardiopulmonary status limiting activity;Decreased range of motion;Decreased activity tolerance;Decreased balance;Decreased safety awareness;Decreased mobility;Decreased knowledge of precautions;Decreased knowledge of use of DME;Decreased cognition       PT Treatment Interventions DME instruction;Therapeutic exercise;Gait training;Balance training;Stair training;Functional mobility training;Therapeutic activities;Patient/family education    PT Goals (Current goals can be found in the Care Plan section)  Acute Rehab PT Goals Patient Stated Goal: not stated PT Goal Formulation: With patient/family Time For Goal Achievement: 06/19/22 Potential to Achieve Goals: Good    Frequency Min 3X/week     Co-evaluation               AM-PAC PT "6 Clicks" Mobility  Outcome Measure Help needed turning from your back to your side while in a flat bed without using bedrails?: A Little Help needed  moving from lying on your back to sitting on the side of a flat bed without using bedrails?: A Little Help needed moving to and from a bed to a chair (including a wheelchair)?: A Little Help needed standing up from a chair using your arms (e.g., wheelchair or bedside chair)?: A Little Help needed to walk in hospital room?: A Little Help needed climbing 3-5 steps with a railing? : A Little 6 Click Score: 18    End of Session Equipment Utilized During Treatment: Gait belt Activity Tolerance: Patient tolerated treatment well Patient left: with call bell/phone within reach;with chair alarm set;in chair Nurse Communication: Mobility status PT Visit Diagnosis: Other abnormalities of gait and mobility (R26.89);Muscle weakness (generalized) (M62.81)    Time: [id number] PT Time Calculation (min) (ACUTE ONLY): 26 min   Charges:   PT Evaluation $PT Eval Low Complexity: 1 Low PT Treatments $Therapeutic Activity: 8-22 mins        Abran Richard, PT Acute Rehab [facility] [phone]   Leon Foster 06/05/2022, 12:57 PM

## 2022-06-06 DIAGNOSIS — E43 Unspecified severe protein-calorie malnutrition: Secondary | ICD-10-CM | POA: Insufficient documentation

## 2022-06-06 DIAGNOSIS — N179 Acute kidney failure, unspecified: Secondary | ICD-10-CM | POA: Diagnosis not present

## 2022-06-06 DIAGNOSIS — D638 Anemia in other chronic diseases classified elsewhere: Secondary | ICD-10-CM | POA: Diagnosis not present

## 2022-06-06 LAB — HEMOGLOBIN AND HEMATOCRIT, BLOOD
HCT: 25 % — ABNORMAL LOW (ref 39.0–52.0)
Hemoglobin: 7.6 g/dL — ABNORMAL LOW (ref 13.0–17.0)

## 2022-06-06 LAB — CBC
HCT: 23.5 % — ABNORMAL LOW (ref 39.0–52.0)
Hemoglobin: 7.1 g/dL — ABNORMAL LOW (ref 13.0–17.0)
MCH: 22.1 pg — ABNORMAL LOW (ref 26.0–34.0)
MCHC: 30.2 g/dL (ref 30.0–36.0)
MCV: 73.2 fL — ABNORMAL LOW (ref 80.0–100.0)
Platelets: 229 10*3/uL (ref 150–400)
RBC: 3.21 MIL/uL — ABNORMAL LOW (ref 4.22–5.81)
RDW: 23.2 % — ABNORMAL HIGH (ref 11.5–15.5)
WBC: 6.5 10*3/uL (ref 4.0–10.5)
nRBC: 0 % (ref 0.0–0.2)

## 2022-06-06 LAB — GLUCOSE, CAPILLARY: Glucose-Capillary: 78 mg/dL (ref 70–99)

## 2022-06-06 LAB — BASIC METABOLIC PANEL
Anion gap: 4 — ABNORMAL LOW (ref 5–15)
BUN: 16 mg/dL (ref 8–23)
CO2: 19 mmol/L — ABNORMAL LOW (ref 22–32)
Calcium: 7.3 mg/dL — ABNORMAL LOW (ref 8.9–10.3)
Chloride: 116 mmol/L — ABNORMAL HIGH (ref 98–111)
Creatinine, Ser: 1.2 mg/dL (ref 0.61–1.24)
GFR, Estimated: >60 mL/min (ref >60)
Glucose, Bld: 88 mg/dL (ref 70–99)
Potassium: 3.9 mmol/L (ref 3.5–5.1)
Sodium: 139 mmol/L (ref 135–145)

## 2022-06-06 LAB — TYPE AND SCREEN
ABO/RH(D): A POS
Antibody Screen: NEGATIVE

## 2022-06-06 MED ORDER — SODIUM CHLORIDE 0.9 % IV SOLN: INTRAVENOUS | Status: DC

## 2022-06-06 NOTE — Progress Notes (Signed)
Calorie Count Note  48 hour calorie count ordered.  Diet: Regular Supplements: Ensure Plus High Protein  Day 1 (2/19): Breakfast: 20% Lunch: unknown (nothing documented) Dinner: 0% Supplements: 100% 1 Ensure, 100% 1 Ensure, 60% 1 Ensure  Total intake: 1059 kcal (67% of minimum estimated needs)  55 protein (73% of minimum estimated needs)  Nutrition Dx: Severe Malnutrition related to chronic illness (COPD, dementia) as evidenced by severe fat depletion, severe muscle depletion.   Goal: Patient will meet greater than or equal to 90% of their needs   Intervention:  - Regular diet.  - Automatic house trays to ensure patient receives 3 meals a day.  - Ensure Plus High Protein po TID, each supplement provides 350 kcal and 20 grams of protein. - Encourage intake at all meals and of supplements.    - 48 Hour calorie count to run 2/19-2/20 (through the end of today)             - Please document % intake of all food, drinks, and supplements patient consumes.     Samson Frederic RD, LDN For contact information, refer to [facility].

## 2022-06-06 NOTE — Progress Notes (Signed)
Patient voided 4m in urinal. Bladder scan done immediately after.  Scan shows only 143mresidual.

## 2022-06-06 NOTE — Progress Notes (Signed)
PROGRESS NOTE    Leon Foster  [id number] DOB: 1941/03/21 DOA: 06/02/2022 PCP: [name], No   Brief Narrative:   Leon Foster is a 82 y.o. male with history of peripheral arterial disease, COPD, alcohol abuse, prior history of AV malformation of the colon was brought to the ER after patient was found to be not eating well as seen and reported by the family.  Workup revealed anemia with negative FOBT.  CT abdomen pelvis shows bladder mass.  Imaging reviewed by urology Dr. 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  The patient has been able to urinate without difficulty.  Nothing else to do from a urology standpoint.  06/06/22: Patient was seen and examined at bedside.  He has no new complaints.  AKI is improving.  Hemoglobin dropped to 7.1.  Will repeat H&H and transfuse hemoglobin less than 7.  Assessment & Plan:  Resolving acute kidney failure: -Likely from poor p.o. intake.  Creatinine: 3.63 upon arrival.  Baseline 1.1-1.2.  Renal function back to baseline 1.2 with GFR greater than 60. Continue to avoid nephrotoxic agents  Resolved hypotensive Resolved lactic acidosis: Presumptive UTI, POA -UA concerning for infection.    Urine culture positive for multiple species, suggest recollection.. -Patient started on cefepime and vancomycin upon arrival. Narrowed  it down to Rocephin.  He is currently afebrile with no leukocytosis.  Received Rocephin x 3 days.  Microcytic anemia: Hemoglobin down to 7.1 today, repeat H&H.  Transfuse hemoglobin less than 7.0. -Will continue to monitor hemoglobin closely.  Normal B12 and folate. -Iron panel suggestive of iron deficiency.  Started on iron infusion.   -H&H have improved.  History of PAD: On Plavix.  Will continue to hold for now  COPD: On room air.  Not in acute exacerbation.  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 -Could be enlarged  prostate. Imaging reviewed by urology, Dr. Tresa Moore.  Nothing to do from urology standpoint. The patient has been able to void without difficulty.  History of dementia: Noted  Severe protein calorie malnutrition: -BMI of 15.  Albumin of 2.8 -Appreciate dietitian recommendations  Resolved sinus bradycardia: TSH: WNL  DVT prophylaxis: SCDs Code Status: Full code Family Communication: None at bedside.   Disposition Plan: Back to ALF in 1 to 2 days.  Awaiting PT evaluation Consultants:  Curb sided with urology.  Procedures:  None  Antimicrobials:  Cefepime Vancomycin Switched to Rocephin on 2/17  Status is: Inpatient     Objective: Vitals:   06/05/22 2008 06/06/22 0029 06/06/22 0333 06/06/22 1339  BP: 104/62 103/60 (!) 99/56 101/63  Pulse: 86 84 80 82  Resp: 18 18 18 18  $ Temp: 98.7 F (37.1 C) 98.4 F (36.9 C) 98.2 F (36.8 C) 98.5 F (36.9 C)  TempSrc: Oral Oral Oral Oral  SpO2: 97% 100% 96% 98%  Weight:      Height:        Intake/Output Summary (Last 24 hours) at 06/06/2022 1801 Last data filed at 06/06/2022 1704 Gross per 24 hour  Intake 240 ml  Output 375 ml  Net -135 ml   Filed Weights   06/03/22 0200  Weight: 44.9 kg    Examination:  General exam: Frail appearing no acute distress.  Alert and interactive.   Respiratory system: Clear to auscultation no wheezes or rales.  Cardiovascular system: Regular rate and rhythm no rubs or gallops.   Gastrointestinal system: Nondistended bowel sounds present.  Central nervous system: Alert and oriented. No focal neurological deficits. Extremities: Symmetric 5 x 5 power.  Left transmetatarsal amputation. Skin: No rashes, lesions or ulcers Psychiatry: Mood is appropriate for condition and setting.  Data Reviewed: I have personally reviewed following labs and imaging studies  CBC: Recent Labs  Lab 06/02/22 2021 06/03/22 0813 06/04/22 0649 06/05/22 0600 06/06/22 0435  WBC 5.4 5.9 6.8 7.3 6.5  NEUTROABS  2.3 4.1  --   --   --   HGB 9.7* 8.1* 7.9* 8.1* 7.1*  HCT 31.3* 26.8* 26.2* 26.7* 23.5*  MCV 70.8* 72.2* 74.0* 74.2* 73.2*  PLT 348 283 247 229 [id number]   Basic Metabolic Panel: Recent Labs  Lab 06/02/22 2021 06/03/22 0659 06/04/22 0649 06/05/22 0600 06/06/22 0435  NA 138 137 137 137 139  K 3.8 4.2 4.2 4.0 3.9  CL 104 105 113* 112* 116*  CO2 19* 23 20* 18* 19*  GLUCOSE 108* 83 84 79 88  BUN 58* 51* 34* 23 16  CREATININE 3.63* 2.86* 1.60* 1.29* 1.20  CALCIUM 9.3 8.1* 7.3* 7.4* 7.3*  MG  --  2.3  --   --   --    GFR: Estimated Creatinine Clearance: 31.2 mL/min (by C-G formula based on SCr of 1.2 mg/dL). Liver Function Tests: Recent Labs  Lab 06/02/22 2021 06/03/22 0659 06/04/22 0649  AST 10* 12* 12*  ALT 5 6 6  $ ALKPHOS 42 35* 38  BILITOT 0.6 0.8 0.6  PROT 7.2 5.5* 5.4*  ALBUMIN 3.9 2.8* 2.5*   No results for input(s): "LIPASE", "AMYLASE" in the last 168 hours. No results for input(s): "AMMONIA" in the last 168 hours. Coagulation Profile: Recent Labs  Lab 06/02/22 2110  INR 1.1   Cardiac Enzymes: No results for input(s): "CKTOTAL", "CKMB", "CKMBINDEX", "TROPONINI" in the last 168 hours. BNP (last 3 results) No results for input(s): "PROBNP" in the last 8760 hours. HbA1C: No results for input(s): "HGBA1C" in the last 72 hours. CBG: Recent Labs  Lab 06/04/22 2354 06/05/22 0544 06/05/22 1220 06/06/22 0030  GLUCAP 72 70 110* 78   Lipid Profile: No results for input(s): "CHOL", "HDL", "LDLCALC", "TRIG", "CHOLHDL", "LDLDIRECT" in the last 72 hours. Thyroid Function Tests: No results for input(s): "TSH", "T4TOTAL", "FREET4", "T3FREE", "THYROIDAB" in the last 72 hours. Anemia Panel: No results for input(s): "VITAMINB12", "FOLATE", "FERRITIN", "TIBC", "IRON", "RETICCTPCT" in the last 72 hours. Sepsis Labs: Recent Labs  Lab 06/02/22 2109 06/03/22 0017 06/03/22 0808 06/03/22 1206  LATICACIDVEN 3.4* 8.7* 1.8 1.8    Recent Results (from the past 240 hour(s))   Resp panel by RT-PCR (RSV, Flu A&B, Covid) Anterior Nasal Swab     Status: None   Collection Time: 06/02/22  9:10 PM   Specimen: Anterior Nasal Swab  Result Value Ref Range Status   SARS Coronavirus 2 by RT PCR NEGATIVE NEGATIVE Final    Comment: (NOTE) SARS-CoV-2 target nucleic acids are NOT DETECTED.  The SARS-CoV-2 RNA is generally detectable in upper respiratory specimens during the acute phase of infection. The lowest concentration of SARS-CoV-2 viral copies this assay can detect is 138 copies/mL. A negative result does not preclude SARS-Cov-2 infection and should not be used as the sole basis for treatment or other patient management decisions. A negative result may occur with  improper specimen collection/handling, submission of specimen other than nasopharyngeal swab, presence of viral mutation(s) within the areas targeted by this assay, and inadequate number of viral copies(<138 copies/mL). A negative result must be combined with clinical observations,  patient history, and epidemiological information. The expected result is Negative.  Fact Sheet for Patients:  EntrepreneurPulse.com.au  Fact Sheet for Healthcare Providers:  IncredibleEmployment.be  This test is no t yet approved or cleared by the [place] FDA and  has been authorized for detection and/or diagnosis of SARS-CoV-2 by FDA under an Emergency Use Authorization (EUA). This EUA will remain  in effect (meaning this test can be used) for the duration of the COVID-19 declaration under Section 564(b)(1) of the Act, 21 U.S.C.section 360bbb-3(b)(1), unless the authorization is terminated  or revoked sooner.       Influenza A by PCR NEGATIVE NEGATIVE Final   Influenza B by PCR NEGATIVE NEGATIVE Final    Comment: (NOTE) The Xpert Xpress SARS-CoV-2/FLU/RSV plus assay is intended as an aid in the diagnosis of influenza from Nasopharyngeal swab specimens and should not be used  as a sole basis for treatment. Nasal washings and aspirates are unacceptable for Xpert Xpress SARS-CoV-2/FLU/RSV testing.  Fact Sheet for Patients: EntrepreneurPulse.com.au  Fact Sheet for Healthcare Providers: IncredibleEmployment.be  This test is not yet approved or cleared by the [place] FDA and has been authorized for detection and/or diagnosis of SARS-CoV-2 by FDA under an Emergency Use Authorization (EUA). This EUA will remain in effect (meaning this test can be used) for the duration of the COVID-19 declaration under Section 564(b)(1) of the Act, 21 U.S.C. section 360bbb-3(b)(1), unless the authorization is terminated or revoked.     Resp Syncytial Virus by PCR NEGATIVE NEGATIVE Final    Comment: (NOTE) Fact Sheet for Patients: EntrepreneurPulse.com.au  Fact Sheet for Healthcare Providers: IncredibleEmployment.be  This test is not yet approved or cleared by the [place] FDA and has been authorized for detection and/or diagnosis of SARS-CoV-2 by FDA under an Emergency Use Authorization (EUA). This EUA will remain in effect (meaning this test can be used) for the duration of the COVID-19 declaration under Section 564(b)(1) of the Act, 21 U.S.C. section 360bbb-3(b)(1), unless the authorization is terminated or revoked.  Performed at [facility], [address], [place], [place] [address]   Blood Culture (routine x 2)     Status: None (Preliminary result)   Collection Time: 06/03/22  2:33 AM   Specimen: BLOOD LEFT ARM  Result Value Ref Range Status   Specimen Description BLOOD LEFT ARM  Final   Special Requests   Final    BOTTLES DRAWN AEROBIC ONLY Blood Culture adequate volume   Culture   Final    NO GROWTH 3 DAYS Performed at [facility] [address] [place], [place] [address]    Report Status PENDING  Incomplete  MRSA Next Gen by PCR, Nasal      Status: None   Collection Time: 06/03/22  2:42 AM   Specimen: Nasal Mucosa; Nasal Swab  Result Value Ref Range Status   MRSA by PCR Next Gen NOT DETECTED NOT DETECTED Final    Comment: (NOTE) The GeneXpert MRSA Assay (FDA approved for NASAL specimens only), is one component of a comprehensive MRSA colonization surveillance program. It is not intended to diagnose MRSA infection nor to guide or monitor treatment for MRSA infections. Test performance is not FDA approved in patients less than 75 years old. Performed at [facility], [place] [address] [place], [place] [address]   Culture, blood (Routine X 2) w Reflex to ID Panel     Status: None (Preliminary result)   Collection Time: 06/03/22  4:40 AM   Specimen:  BLOOD  Result Value Ref Range Status   Specimen Description   Final    BLOOD BLOOD RIGHT ARM Performed at [facility], [address], [place], [place] [address]    Special Requests   Final    BOTTLES DRAWN AEROBIC AND ANAEROBIC Blood Culture adequate volume Performed at [facility], [address] [place], [place] [address]    Culture   Final    NO GROWTH 3 DAYS Performed at [facility], [place] [address] [place], [place] [address]    Report Status PENDING  Incomplete  Urine Culture     Status: Abnormal   Collection Time: 06/03/22  5:57 AM   Specimen: Urine, Random  Result Value Ref Range Status   Specimen Description   Final    URINE, RANDOM Performed at [facility] [address], [place], [place] [address]    Special Requests   Final    NONE Reflexed from [id number] Performed at [facility], [place] [address], [place], [place] [address]    Culture (A)  Final    20,000 COLONIES/mL MULTIPLE SPECIES PRESENT, SUGGEST RECOLLECTION   Report Status 06/04/2022 FINAL  Final      Radiology Studies: No results found.  Scheduled Meds:  acamprosate  333 mg Oral TID WC    Chlorhexidine Gluconate Cloth  6 each Topical Daily   donepezil  10 mg Oral QHS   feeding supplement  237 mL Oral TID BM   multivitamin with minerals  1 tablet Oral Daily   naltrexone  50 mg Oral Daily   mouth rinse  15 mL Mouth Rinse 4 times per day   sodium chloride flush  10-40 mL Intracatheter Q12H   Continuous Infusions:  sodium chloride 100 mL/hr at 06/05/22 1500   iron sucrose 150 mg (06/05/22 1610)     LOS: 3 days   Time spent: 35 minutes   Kayleen Memos, MD [facility]  If 7PM-7AM, please contact night-coverage [email or web address] 06/06/2022, 6:01 PM

## 2022-06-07 DIAGNOSIS — J449 Chronic obstructive pulmonary disease, unspecified: Secondary | ICD-10-CM | POA: Insufficient documentation

## 2022-06-07 DIAGNOSIS — N179 Acute kidney failure, unspecified: Secondary | ICD-10-CM | POA: Diagnosis not present

## 2022-06-07 DIAGNOSIS — D509 Iron deficiency anemia, unspecified: Secondary | ICD-10-CM | POA: Insufficient documentation

## 2022-06-07 DIAGNOSIS — N390 Urinary tract infection, site not specified: Secondary | ICD-10-CM | POA: Insufficient documentation

## 2022-06-07 DIAGNOSIS — N400 Enlarged prostate without lower urinary tract symptoms: Secondary | ICD-10-CM | POA: Insufficient documentation

## 2022-06-07 LAB — BASIC METABOLIC PANEL
Anion gap: 5 (ref 5–15)
BUN: 11 mg/dL (ref 8–23)
CO2: 19 mmol/L — ABNORMAL LOW (ref 22–32)
Calcium: 7.5 mg/dL — ABNORMAL LOW (ref 8.9–10.3)
Chloride: 117 mmol/L — ABNORMAL HIGH (ref 98–111)
Creatinine, Ser: 1.02 mg/dL (ref 0.61–1.24)
GFR, Estimated: >60 mL/min (ref >60)
Glucose, Bld: 91 mg/dL (ref 70–99)
Potassium: 3.9 mmol/L (ref 3.5–5.1)
Sodium: 141 mmol/L (ref 135–145)

## 2022-06-07 LAB — CBC
HCT: 23.3 % — ABNORMAL LOW (ref 39.0–52.0)
Hemoglobin: 7 g/dL — ABNORMAL LOW (ref 13.0–17.0)
MCH: 22.2 pg — ABNORMAL LOW (ref 26.0–34.0)
MCHC: 30 g/dL (ref 30.0–36.0)
MCV: 74 fL — ABNORMAL LOW (ref 80.0–100.0)
Platelets: 207 10*3/uL (ref 150–400)
RBC: 3.15 MIL/uL — ABNORMAL LOW (ref 4.22–5.81)
RDW: 23.7 % — ABNORMAL HIGH (ref 11.5–15.5)
WBC: 7 10*3/uL (ref 4.0–10.5)
nRBC: 0 % (ref 0.0–0.2)

## 2022-06-07 MED ORDER — FERROUS SULFATE 325 (65 FE) MG PO TBEC: 325.0000 mg | DELAYED_RELEASE_TABLET | Freq: Two times a day (BID) | ORAL | 3 refills | Status: AC

## 2022-06-07 NOTE — Discharge Summary (Addendum)
Physician Discharge Summary  Leon Foster [id number] DOB: 1940/04/26 DOA: 06/02/2022  PCP: Pcp, No  Admit date: 06/02/2022 Discharge date: 06/07/2022  Admitted From: ALF  Disposition:  ALF with home health   Recommendations for Outpatient Follow-up:  Follow up with PCP in 1 week. Follow up repeat CBC to ensure improvement in Hgb.   Discharge Condition: Stable CODE STATUS: Full  Diet recommendation: Regular   Brief/Interim Summary: From H&P by Dr. Hal Hope: "Leon Foster is a 82 y.o. male with history of peripheral arterial disease, COPD, alcohol abuse, prior history of AV malformation of the colon was brought to the ER after patient was found to be not eating well as seen by the family.  Not much history is of available.   ED Course: In the ER patient was found to be hypotensive with a systolic in the [id number] lactic acid was elevated at 3.  Labs also showed creatinine of 3.6 which increased from 1.2 about 3 years ago on 7/20.  Hemoglobin has dropped by 4 g from previous of 14 and is 9.7.  Stool for occult blood was negative.  Chest x-ray unremarkable CT abdomen pelvis done shows bladder mass.  Patient was placed on fluids empiric antibiotics admitted for further management.  At the time of my exam after patient reached [facility] long hospital, patient is not hypotensive afebrile and not in distress."  Imaging was reviewed by urology and bladder mass was seen to be likely secondary to prostamegaly.  Patient has been able to urinate without any difficulty.  No further urology plans.  Patient's AKI resolved with IV fluids.  He also received IV Rocephin for presumed UTI.  His hemoglobin remained low, iron panel suggestive of iron deficiency, patient was given IV iron.  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)   HTN (hypertension)   Anemia, chronic  disease   Dementia (HCC)   History of alcohol abuse   Severe sepsis (HCC)   Lactic acidosis   ARF (acute renal failure) with cortical necrosis (HCC)   Bladder mass   Protein-calorie malnutrition, severe   UTI (urinary tract infection)   Iron deficiency anemia   Microcytic anemia   COPD (chronic obstructive pulmonary disease) (HCC)   Enlarged prostate    Discharge Instructions  Discharge Instructions     Call MD for:  difficulty breathing, headache or visual disturbances   Complete by: As directed    Call MD for:  extreme fatigue   Complete by: As directed    Call MD for:  persistant dizziness or light-headedness   Complete by: As directed    Call MD for:  persistant nausea and vomiting   Complete by: As directed    Call MD for:  severe uncontrolled pain   Complete by: As directed    Call MD for:  temperature >100.4   Complete by: As directed    Diet general   Complete by: As directed    Discharge instructions   Complete by: As directed    You were cared for by a hospitalist during your hospital stay. If you have any questions about your discharge medications or the care you received while you were in the hospital after you are discharged, you can call the unit and ask to speak with the hospitalist on call if the hospitalist that took care of you is not available. Once  you are discharged, your primary care physician will handle any further medical issues. Please note that NO REFILLS for any discharge medications will be authorized once you are discharged, as it is imperative that you return to your primary care physician (or establish a relationship with a primary care physician if you do not have one) for your aftercare needs so that they can reassess your need for medications and monitor your lab values.   Increase activity slowly   Complete by: As directed    No wound care   Complete by: As directed       Allergies as of 06/07/2022   No Known Allergies       Medication List     STOP taking these medications    meloxicam 7.5 MG tablet Commonly known as: MOBIC   metoprolol tartrate 25 MG tablet Commonly known as: LOPRESSOR       TAKE these medications    acamprosate 333 MG tablet Commonly known as: CAMPRAL Take 333 mg by mouth 3 (three) times daily with meals.   clopidogrel 75 MG tablet Commonly known as: PLAVIX Take 1 tablet (75 mg total) by mouth daily with breakfast.   donepezil 10 MG tablet Commonly known as: ARICEPT Take 10 mg by mouth at bedtime.   ferrous sulfate 325 (65 FE) MG EC tablet Take 1 tablet (325 mg total) by mouth 2 (two) times daily.   multivitamin with minerals Tabs tablet Take 1 tablet by mouth daily.   naltrexone 50 MG tablet Commonly known as: DEPADE Take 50 mg by mouth 2 (two) times a day.        Follow-up Information     [facility].   Specialty: Home Health Services Why: Your home health has been set up with [facility]. The office will call with start of service information. If you have any questions or concerns please call the number listed above. Contact information: [address] [place] [address] [phone]         Lauree Chandler, NP Follow up.   Specialty: Geriatric Medicine Contact information: [place] [address] [phone]                No Known Allergies  Consultations: None    Procedures/Studies: ECHOCARDIOGRAM COMPLETE  Result Date: 06/04/2022    ECHOCARDIOGRAM REPORT   Patient Name:   Leon Foster Date of Exam: 06/04/2022 Medical Rec #:  [id number]       Height:       67.0 in Accession #:    [id number]      Weight:       99.0 lb Date of Birth:  [birth year]       BSA:          1.500 m Patient Age:    3 years        BP:           112/60 mmHg Patient Gender: M               HR:           72 bpm. Exam Location:  Inpatient Procedure: 2D Echo, Cardiac Doppler and Color Doppler Indications:    R94.31  Abnormal EKG  History:        Patient has prior history of Echocardiogram examinations.                 Cardiomyopathy, COPD, Arrythmias:Atrial Fibrillation; Risk  Factors:Hypertension.  Sonographer:    Phineas Douglas Referring Phys: [id number] [place]  1. Poor image quality no apical windows.  2. Left ventricular ejection fraction, by estimation, is 60 to 65%. The left ventricle has normal function. The left ventricle has no regional wall motion abnormalities. Left ventricular diastolic parameters are indeterminate.  3. Right ventricular systolic function is mildly reduced. The right ventricular size is mildly enlarged. There is mildly elevated pulmonary artery systolic pressure.  4. Right atrial size was moderately dilated.  5. The mitral valve is abnormal. No evidence of mitral valve regurgitation. No evidence of mitral stenosis. Moderate mitral annular calcification.  6. Tricuspid valve regurgitation is moderate.  7. The aortic valve is tricuspid. There is mild calcification of the aortic valve. There is mild thickening of the aortic valve. Aortic valve regurgitation is trivial. Aortic valve sclerosis is present, with no evidence of aortic valve stenosis.  8. The inferior vena cava is dilated in size with <50% respiratory variability, suggesting right atrial pressure of 15 mmHg. FINDINGS  Left Ventricle: Left ventricular ejection fraction, by estimation, is 60 to 65%. The left ventricle has normal function. The left ventricle has no regional wall motion abnormalities. The left ventricular internal cavity size was normal in size. There is  no left ventricular hypertrophy. Left ventricular diastolic parameters are indeterminate. Right Ventricle: The right ventricular size is mildly enlarged. No increase in right ventricular wall thickness. Right ventricular systolic function is mildly reduced. There is mildly elevated pulmonary artery systolic pressure. The tricuspid regurgitant   velocity is 2.64 m/s, and with an assumed right atrial pressure of 15 mmHg, the estimated right ventricular systolic pressure is 0000000 mmHg. Left Atrium: Left atrial size was normal in size. Right Atrium: Right atrial size was moderately dilated. Pericardium: Trivial pericardial effusion is present. The pericardial effusion is anterior to the right ventricle. Mitral Valve: The mitral valve is abnormal. There is mild thickening of the mitral valve leaflet(s). There is mild calcification of the mitral valve leaflet(s). Moderate mitral annular calcification. No evidence of mitral valve regurgitation. No evidence  of mitral valve stenosis. Tricuspid Valve: The tricuspid valve is normal in structure. Tricuspid valve regurgitation is moderate . No evidence of tricuspid stenosis. Aortic Valve: The aortic valve is tricuspid. There is mild calcification of the aortic valve. There is mild thickening of the aortic valve. Aortic valve regurgitation is trivial. Aortic valve sclerosis is present, with no evidence of aortic valve stenosis. Pulmonic Valve: The pulmonic valve was normal in structure. Pulmonic valve regurgitation is not visualized. No evidence of pulmonic stenosis. Aorta: The aortic root is normal in size and structure. Venous: The inferior vena cava is dilated in size with less than 50% respiratory variability, suggesting right atrial pressure of 15 mmHg. IAS/Shunts: No atrial level shunt detected by color flow Doppler. Additional Comments: Poor image quality no apical windows.  LEFT VENTRICLE PLAX 2D LVIDd:         4.30 cm   Diastology LVIDs:         2.90 cm   LV e' medial:  5.77 cm/s LV PW:         1.10 cm   LV e' lateral: 11.20 cm/s LV IVS:        0.80 cm LVOT diam:     2.20 cm LVOT Area:     3.80 cm  RIGHT VENTRICLE             IVC RV Basal diam:  4.60 cm  IVC diam: 2.40 cm RV S prime:     11.70 cm/s LEFT ATRIUM           Index        RIGHT ATRIUM           Index LA diam:      3.00 cm 2.00 cm/m   RA Area:      21.70 cm LA Vol (A4C): 40.8 ml 27.20 ml/m  RA Volume:   71.60 ml  47.74 ml/m   AORTA Ao Root diam: 3.30 cm Ao Asc diam:  3.00 cm TRICUSPID VALVE TR Peak grad:   27.9 mmHg TR Vmax:        264.00 cm/s  SHUNTS Systemic Diam: 2.20 cm Jenkins Rouge MD Electronically signed by Jenkins Rouge MD Signature Date/Time: 06/04/2022/11:01:05 AM    Final    [place] EKG SITE RITE  Result Date: 06/03/2022 If Site Rite image not attached, placement could not be confirmed due to current cardiac rhythm.  CT ABDOMEN PELVIS WO CONTRAST  Result Date: 06/02/2022 CLINICAL DATA:  Abdomen pain decreased appetite EXAM: CT ABDOMEN AND PELVIS WITHOUT CONTRAST TECHNIQUE: Multidetector CT imaging of the abdomen and pelvis was performed following the standard protocol without IV contrast. RADIATION DOSE REDUCTION: This exam was performed according to the departmental dose-optimization program which includes automated exposure control, adjustment of the mA and/or kV according to patient size and/or use of iterative reconstruction technique. COMPARISON:  CT 07/11/2005 FINDINGS: Lower chest: Lung bases demonstrate emphysema. Mild bronchiectasis and scarring in the left lung base. Hepatobiliary: No calcified gallstone or biliary dilatation. No gross focal hepatic abnormality without contrast Pancreas: Negative for inflammation Spleen: Grossly negative Adrenals/Urinary Tract: Adrenal glands are within normal limits. Kidneys show no hydronephrosis. Slightly thick-walled appearance of the bladder. 14 mm stone within the right posterior bladder. Pedunculated mass measuring 5.3 by 3.7 cm, possibly contiguous with prostate. Stomach/Bowel: Limited assessment due to absence of contrast, and cachectic-appearing patient with lack of intra-abdominal fat and poor contrast. No gross bowel distension or bowel wall thickening. Fluid-filled nondilated bowel in the abdomen and pelvis Vascular/Lymphatic: Advanced aortic atherosclerosis. No aneurysm. No grossly  enlarged lymph nodes. Reproductive: Enlarged prostate possibly contiguous with a pedunculated bladder mass. Other: Negative for pelvic effusion or free air. Musculoskeletal: Age indeterminate moderate superior endplate compression fractures at T12 and L1. IMPRESSION: 1. Markedly limited exam secondary to absence of intra-abdominal fat/cachectic-appearing patient and absence of oral or intravenous contrast. 2. Slightly thick-walled appearance of the urinary bladder, possible chronic obstruction versus cystitis. Large 5.3 cm pedunculated mass within the bladder lumen, possibly contiguous with enlarged prostate. Recommend direct visualization. 14 mm bladder stone 3. Emphysema Electronically Signed   By: Donavan Foil M.D.   On: 06/02/2022 23:29   DG Chest Port 1 View  Result Date: 06/02/2022 CLINICAL DATA:  Possible sepsis.  Weight loss. EXAM: PORTABLE CHEST 1 VIEW COMPARISON:  09/17/2018. FINDINGS: The heart size and mediastinal contours are within normal limits. There is atherosclerotic calcification of the lungs. Hyperinflation of the lungs is noted bilaterally. No consolidation, effusion, or pneumothorax. Bilateral nipple shadows are noted and seen on previous exams. No acute osseous abnormality. IMPRESSION: 1. No active disease. 2. Hyperinflation of the lungs. Electronically Signed   By: Brett Fairy M.D.   On: 06/02/2022 20:38       Discharge Exam: Vitals:   06/06/22 1339 06/07/22 0428  BP: 101/63 99/76  Pulse: 82 79  Resp: 18 18  Temp: 98.5 F (36.9 C) 97.9 F (36.6  C)  SpO2: 98% 99%    General: Pt is alert, awake, not in acute distress Cardiovascular: RRR, S1/S2 +, no edema Respiratory: CTA bilaterally, no wheezing, no rhonchi, no respiratory distress, no conversational dyspnea  Abdominal: Soft, NT, ND, bowel sounds + Extremities: no edema, no cyanosis  The results of significant diagnostics from this hospitalization (including imaging, microbiology, ancillary and laboratory) are  listed below for reference.     Microbiology: Recent Results (from the past 240 hour(s))  Resp panel by RT-PCR (RSV, Flu A&B, Covid) Anterior Nasal Swab     Status: None   Collection Time: 06/02/22  9:10 PM   Specimen: Anterior Nasal Swab  Result Value Ref Range Status   SARS Coronavirus 2 by RT PCR NEGATIVE NEGATIVE Final    Comment: (NOTE) SARS-CoV-2 target nucleic acids are NOT DETECTED.  The SARS-CoV-2 RNA is generally detectable in upper respiratory specimens during the acute phase of infection. The lowest concentration of SARS-CoV-2 viral copies this assay can detect is 138 copies/mL. A negative result does not preclude SARS-Cov-2 infection and should not be used as the sole basis for treatment or other patient management decisions. A negative result may occur with  improper specimen collection/handling, submission of specimen other than nasopharyngeal swab, presence of viral mutation(s) within the areas targeted by this assay, and inadequate number of viral copies(<138 copies/mL). A negative result must be combined with clinical observations, patient history, and epidemiological information. The expected result is Negative.  Fact Sheet for Patients:  EntrepreneurPulse.com.au  Fact Sheet for Healthcare Providers:  IncredibleEmployment.be  This test is no t yet approved or cleared by the [place] FDA and  has been authorized for detection and/or diagnosis of SARS-CoV-2 by FDA under an Emergency Use Authorization (EUA). This EUA will remain  in effect (meaning this test can be used) for the duration of the COVID-19 declaration under Section 564(b)(1) of the Act, 21 U.S.C.section 360bbb-3(b)(1), unless the authorization is terminated  or revoked sooner.       Influenza A by PCR NEGATIVE NEGATIVE Final   Influenza B by PCR NEGATIVE NEGATIVE Final    Comment: (NOTE) The Xpert Xpress SARS-CoV-2/FLU/RSV plus assay is intended as an  aid in the diagnosis of influenza from Nasopharyngeal swab specimens and should not be used as a sole basis for treatment. Nasal washings and aspirates are unacceptable for Xpert Xpress SARS-CoV-2/FLU/RSV testing.  Fact Sheet for Patients: EntrepreneurPulse.com.au  Fact Sheet for Healthcare Providers: IncredibleEmployment.be  This test is not yet approved or cleared by the [place] FDA and has been authorized for detection and/or diagnosis of SARS-CoV-2 by FDA under an Emergency Use Authorization (EUA). This EUA will remain in effect (meaning this test can be used) for the duration of the COVID-19 declaration under Section 564(b)(1) of the Act, 21 U.S.C. section 360bbb-3(b)(1), unless the authorization is terminated or revoked.     Resp Syncytial Virus by PCR NEGATIVE NEGATIVE Final    Comment: (NOTE) Fact Sheet for Patients: EntrepreneurPulse.com.au  Fact Sheet for Healthcare Providers: IncredibleEmployment.be  This test is not yet approved or cleared by the [place] FDA and has been authorized for detection and/or diagnosis of SARS-CoV-2 by FDA under an Emergency Use Authorization (EUA). This EUA will remain in effect (meaning this test can be used) for the duration of the COVID-19 declaration under Section 564(b)(1) of the Act, 21 U.S.C. section 360bbb-3(b)(1), unless the authorization is terminated or revoked.  Performed at [facility], [address] [place], [place] [address]  Blood Culture (routine x 2)     Status: None (Preliminary result)   Collection Time: 06/03/22  2:33 AM   Specimen: BLOOD LEFT ARM  Result Value Ref Range Status   Specimen Description BLOOD LEFT ARM  Final   Special Requests   Final    BOTTLES DRAWN AEROBIC ONLY Blood Culture adequate volume   Culture   Final    NO GROWTH 4 DAYS Performed at [facility], [address]  [place], [place] [address]    Report Status PENDING  Incomplete  MRSA Next Gen by PCR, Nasal     Status: None   Collection Time: 06/03/22  2:42 AM   Specimen: Nasal Mucosa; Nasal Swab  Result Value Ref Range Status   MRSA by PCR Next Gen NOT DETECTED NOT DETECTED Final    Comment: (NOTE) The GeneXpert MRSA Assay (FDA approved for NASAL specimens only), is one component of a comprehensive MRSA colonization surveillance program. It is not intended to diagnose MRSA infection nor to guide or monitor treatment for MRSA infections. Test performance is not FDA approved in patients less than 57 years old. Performed at [facility], [place] [address], [place], [place] [address]   Culture, blood (Routine X 2) w Reflex to ID Panel     Status: None (Preliminary result)   Collection Time: 06/03/22  4:40 AM   Specimen: BLOOD  Result Value Ref Range Status   Specimen Description   Final    BLOOD BLOOD RIGHT ARM Performed at [facility], [address] [place], [place] [address]    Special Requests   Final    BOTTLES DRAWN AEROBIC AND ANAEROBIC Blood Culture adequate volume Performed at [facility], [address] [place], [place] [address]    Culture   Final    NO GROWTH 4 DAYS Performed at [facility], [place] [address], [place], [place] [address]    Report Status PENDING  Incomplete  Urine Culture     Status: Abnormal   Collection Time: 06/03/22  5:57 AM   Specimen: Urine, Random  Result Value Ref Range Status   Specimen Description   Final    URINE, RANDOM Performed at [facility] [address], [place], [place] [address]    Special Requests   Final    NONE Reflexed from [id number] Performed at [facility], [place] [address], [place], [place] [address]    Culture (A)  Final    20,000 COLONIES/mL MULTIPLE SPECIES PRESENT, SUGGEST RECOLLECTION   Report Status 06/04/2022 FINAL  Final      Labs: BNP (last 3 results) No results for input(s): "BNP" in the last 8760 hours. Basic Metabolic Panel: Recent Labs  Lab 06/03/22 0659 06/04/22 0649 06/05/22 0600 06/06/22 0435 06/07/22 0850  NA 137 137 137 139 141  K 4.2 4.2 4.0 3.9 3.9  CL 105 113* 112* 116* 117*  CO2 23 20* 18* 19* 19*  GLUCOSE 83 84 79 88 91  BUN 51* 34* 23 16 11  $ CREATININE 2.86* 1.60* 1.29* 1.20 1.02  CALCIUM 8.1* 7.3* 7.4* 7.3* 7.5*  MG 2.3  --   --   --   --    Liver Function Tests: Recent Labs  Lab 06/02/22 2021 06/03/22 0659 06/04/22 0649  AST 10* 12* 12*  ALT 5 6 6  $ ALKPHOS 42 35* 38  BILITOT 0.6 0.8 0.6  PROT 7.2 5.5* 5.4*  ALBUMIN 3.9 2.8* 2.5*  No results for input(s): "LIPASE", "AMYLASE" in the last 168 hours. No results for input(s): "AMMONIA" in the last 168 hours. CBC: Recent Labs  Lab 06/02/22 2021 06/03/22 0813 06/04/22 0649 06/05/22 0600 06/06/22 0435 06/06/22 2027 06/07/22 0850  WBC 5.4 5.9 6.8 7.3 6.5  --  7.0  NEUTROABS 2.3 4.1  --   --   --   --   --   HGB 9.7* 8.1* 7.9* 8.1* 7.1* 7.6* 7.0*  HCT 31.3* 26.8* 26.2* 26.7* 23.5* 25.0* 23.3*  MCV 70.8* 72.2* 74.0* 74.2* 73.2*  --  74.0*  PLT 348 283 247 229 229  --  207   Cardiac Enzymes: No results for input(s): "CKTOTAL", "CKMB", "CKMBINDEX", "TROPONINI" in the last 168 hours. BNP: Invalid input(s): "POCBNP" CBG: Recent Labs  Lab 06/04/22 2354 06/05/22 0544 06/05/22 1220 06/06/22 0030  GLUCAP 72 70 110* 78   D-Dimer No results for input(s): "DDIMER" in the last 72 hours. Hgb A1c No results for input(s): "HGBA1C" in the last 72 hours. Lipid Profile No results for input(s): "CHOL", "HDL", "LDLCALC", "TRIG", "CHOLHDL", "LDLDIRECT" in the last 72 hours. Thyroid function studies No results for input(s): "TSH", "T4TOTAL", "T3FREE", "THYROIDAB" in the last 72 hours.  Invalid input(s): "FREET3" Anemia work up No results for input(s): "VITAMINB12", "FOLATE", "FERRITIN", "TIBC", "IRON", "RETICCTPCT" in  the last 72 hours. Urinalysis    Component Value Date/Time   COLORURINE YELLOW 06/03/2022 0557   APPEARANCEUR CLOUDY (A) 06/03/2022 0557   LABSPEC 1.020 06/03/2022 0557   PHURINE 5.0 06/03/2022 0557   GLUCOSEU NEGATIVE 06/03/2022 0557   HGBUR MODERATE (A) 06/03/2022 0557   BILIRUBINUR NEGATIVE 06/03/2022 0557   KETONESUR NEGATIVE 06/03/2022 0557   PROTEINUR 100 (A) 06/03/2022 0557   UROBILINOGEN 0.2 01/17/2015 1530   NITRITE POSITIVE (A) 06/03/2022 0557   LEUKOCYTESUR LARGE (A) 06/03/2022 0557   Sepsis Labs Recent Labs  Lab 06/04/22 0649 06/05/22 0600 06/06/22 0435 06/07/22 0850  WBC 6.8 7.3 6.5 7.0   Microbiology Recent Results (from the past 240 hour(s))  Resp panel by RT-PCR (RSV, Flu A&B, Covid) Anterior Nasal Swab     Status: None   Collection Time: 06/02/22  9:10 PM   Specimen: Anterior Nasal Swab  Result Value Ref Range Status   SARS Coronavirus 2 by RT PCR NEGATIVE NEGATIVE Final    Comment: (NOTE) SARS-CoV-2 target nucleic acids are NOT DETECTED.  The SARS-CoV-2 RNA is generally detectable in upper respiratory specimens during the acute phase of infection. The lowest concentration of SARS-CoV-2 viral copies this assay can detect is 138 copies/mL. A negative result does not preclude SARS-Cov-2 infection and should not be used as the sole basis for treatment or other patient management decisions. A negative result may occur with  improper specimen collection/handling, submission of specimen other than nasopharyngeal swab, presence of viral mutation(s) within the areas targeted by this assay, and inadequate number of viral copies(<138 copies/mL). A negative result must be combined with clinical observations, patient history, and epidemiological information. The expected result is Negative.  Fact Sheet for Patients:  EntrepreneurPulse.com.au  Fact Sheet for Healthcare Providers:  IncredibleEmployment.be  This test is no t  yet approved or cleared by the [place] FDA and  has been authorized for detection and/or diagnosis of SARS-CoV-2 by FDA under an Emergency Use Authorization (EUA). This EUA will remain  in effect (meaning this test can be used) for the duration of the COVID-19 declaration under Section 564(b)(1) of the Act, 21 U.S.C.section 360bbb-3(b)(1), unless the authorization is  terminated  or revoked sooner.       Influenza A by PCR NEGATIVE NEGATIVE Final   Influenza B by PCR NEGATIVE NEGATIVE Final    Comment: (NOTE) The Xpert Xpress SARS-CoV-2/FLU/RSV plus assay is intended as an aid in the diagnosis of influenza from Nasopharyngeal swab specimens and should not be used as a sole basis for treatment. Nasal washings and aspirates are unacceptable for Xpert Xpress SARS-CoV-2/FLU/RSV testing.  Fact Sheet for Patients: EntrepreneurPulse.com.au  Fact Sheet for Healthcare Providers: IncredibleEmployment.be  This test is not yet approved or cleared by the [place] FDA and has been authorized for detection and/or diagnosis of SARS-CoV-2 by FDA under an Emergency Use Authorization (EUA). This EUA will remain in effect (meaning this test can be used) for the duration of the COVID-19 declaration under Section 564(b)(1) of the Act, 21 U.S.C. section 360bbb-3(b)(1), unless the authorization is terminated or revoked.     Resp Syncytial Virus by PCR NEGATIVE NEGATIVE Final    Comment: (NOTE) Fact Sheet for Patients: EntrepreneurPulse.com.au  Fact Sheet for Healthcare Providers: IncredibleEmployment.be  This test is not yet approved or cleared by the [place] FDA and has been authorized for detection and/or diagnosis of SARS-CoV-2 by FDA under an Emergency Use Authorization (EUA). This EUA will remain in effect (meaning this test can be used) for the duration of the COVID-19 declaration under Section 564(b)(1)  of the Act, 21 U.S.C. section 360bbb-3(b)(1), unless the authorization is terminated or revoked.  Performed at [facility], [address] [place], [place] [address]   Blood Culture (routine x 2)     Status: None (Preliminary result)   Collection Time: 06/03/22  2:33 AM   Specimen: BLOOD LEFT ARM  Result Value Ref Range Status   Specimen Description BLOOD LEFT ARM  Final   Special Requests   Final    BOTTLES DRAWN AEROBIC ONLY Blood Culture adequate volume   Culture   Final    NO GROWTH 4 DAYS Performed at [facility], [address], [place], [place] [address]    Report Status PENDING  Incomplete  MRSA Next Gen by PCR, Nasal     Status: None   Collection Time: 06/03/22  2:42 AM   Specimen: Nasal Mucosa; Nasal Swab  Result Value Ref Range Status   MRSA by PCR Next Gen NOT DETECTED NOT DETECTED Final    Comment: (NOTE) The GeneXpert MRSA Assay (FDA approved for NASAL specimens only), is one component of a comprehensive MRSA colonization surveillance program. It is not intended to diagnose MRSA infection nor to guide or monitor treatment for MRSA infections. Test performance is not FDA approved in patients less than 63 years old. Performed at [facility], [place] [address], [place], [place] [address]   Culture, blood (Routine X 2) w Reflex to ID Panel     Status: None (Preliminary result)   Collection Time: 06/03/22  4:40 AM   Specimen: BLOOD  Result Value Ref Range Status   Specimen Description   Final    BLOOD BLOOD RIGHT ARM Performed at [facility], [address] [place], [place] [address]    Special Requests   Final    BOTTLES DRAWN AEROBIC AND ANAEROBIC Blood Culture adequate volume Performed at [facility], [address], [place], [place] [address]    Culture   Final    NO GROWTH 4 DAYS Performed at [facility], [place] [address] [place], [place] [address]  Report  Status PENDING  Incomplete  Urine Culture     Status: Abnormal   Collection Time: 06/03/22  5:57 AM   Specimen: Urine, Random  Result Value Ref Range Status   Specimen Description   Final    URINE, RANDOM Performed at [facility] [address], [place], [place] [address]    Special Requests   Final    NONE Reflexed from [id number] Performed at [facility], [place] [address] [place], [place] [address]    Culture (A)  Final    20,000 COLONIES/mL MULTIPLE SPECIES PRESENT, SUGGEST RECOLLECTION   Report Status 06/04/2022 FINAL  Final     Patient was seen and examined on the day of discharge and was found to be in stable condition. Time coordinating discharge: 40 minutes including assessment and coordination of care, as well as examination of the patient.   SIGNED:  Dessa Phi, DO [facility] 06/07/2022, 10:49 AM

## 2022-06-07 NOTE — Progress Notes (Signed)
RA DL PICC removed per protocol per MD order. Manual pressure applied for 5 mins. Vaseline gauze, gauze, and Tegaderm applied over insertion site. No bleeding or swelling noted. Instructed patient to remain in bed for thirty mins. Educated patient about S/S of infection and when to call MD; no heavy lifting or pressure on right side for 24 hours; keep dressing dry and intact for 24 hours. Pt verbalized comprehension.

## 2022-06-07 NOTE — TOC Transition Note (Signed)
Transition of Care [facility]) - CM/SW Discharge Note   Patient Details  Name: BRITON TIFFIN MRN: [id number] Date of Birth: April 04, 1941  [facility] CM/SW Contact:  Angelita Ingles, RN Phone [phone]  06/07/2022, 10:32 AM   Clinical Narrative:    CM at bedside to update patient about home health recommendation patient verbalizes understanding but unable to make choice. CM spoke with facility [facility] who confirms that patient is from facility and can return. Facility has no preference for home health agency. Home health referral has been accepted by Claiborne Billings with [facility]. AVS has been updated.          Patient Goals and CMS Choice      Discharge Placement                         Discharge Plan and Services Additional resources added to the After Visit Summary for                                       Social Determinants of Health (SDOH) Interventions SDOH Screenings   Food Insecurity: No Food Insecurity (06/03/2022)  Housing: Low Risk  (06/03/2022)  Transportation Needs: No Transportation Needs (06/03/2022)  Utilities: Not At Risk (06/03/2022)  Tobacco Use: High Risk (06/03/2022)     Readmission Risk Interventions     No data to display

## 2022-06-07 NOTE — Progress Notes (Signed)
Physical Therapy Treatment Patient Details Name: Leon Foster MRN: [id number] DOB: [birth year] Today's Date: 06/07/2022   History of Present Illness Pt is 82 yo male presented to hospital on 06/02/22 with poor PO intake at ALF. Pt admitted with acute kidney failure and hypotension.  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    PT Comments    Pt agreeable to therapy. Pt ambulates around bed to seated surface ~10 ft, takes a seated rest then returns back to EOB, min guard for safety. Pt with minor LOB initially, able to recover without physical assist or AD. Pt then ambulates circle in room ~20 ft before returning to EOB. Pt reports improved steadiness when wearing shoes, but doesn't have them here. Pt on RA, no SOB, denies pain, without complaints during session. Continue to progress as able.    Recommendations for follow up therapy are one component of a multi-disciplinary discharge planning process, led by the attending physician.  Recommendations may be updated based on patient status, additional functional criteria and insurance authorization.  Follow Up Recommendations  Home health PT (return to ALF with HHPT)     Assistance Recommended at Discharge Frequent or constant Supervision/Assistance  Patient can return home with the following A little help with walking and/or transfers;A little help with bathing/dressing/bathroom;Assistance with cooking/housework;Help with stairs or ramp for entrance   Equipment Recommendations  None recommended by PT    Recommendations for Other Services       Precautions / Restrictions Precautions Precautions: Fall Restrictions Weight Bearing Restrictions: No     Mobility  Bed Mobility Overal bed mobility: Modified Independent   Transfers Overall transfer level: Needs assistance Equipment used: 1 person hand  held assist Transfers: Sit to/from Stand Sit to Stand: Min guard  General transfer comment: BUE assisting to power to stand from EOB and recliner    Ambulation/Gait Ambulation/Gait assistance: Min guard Gait Distance (Feet): 20 Feet (+10, +10) Assistive device: None Gait Pattern/deviations: Step-to pattern, Decreased stride length, Trunk flexed, Narrow base of support Gait velocity: decreased  General Gait Details: short, small steps ~10 ft in room to seated surface with minor LOB initially and able to recover without assistance, then able to return back to EOB, pt then ambulates 20 ft in room, no AD, occasional reaching for furniture to steady self   Stairs             Wheelchair Mobility    Modified Rankin (Stroke Patients Only)       Balance Overall balance assessment: Mild deficits observed, not formally tested     Cognition Arousal/Alertness: Awake/alert Behavior During Therapy: WFL for tasks assessed/performed Overall Cognitive Status: History of cognitive impairments - at baseline     Exercises      General Comments General comments (skin integrity, edema, etc.): pt on RA and doesn't appear SOB      Pertinent Vitals/Pain Pain Assessment Pain Assessment: No/denies pain    Home Living                          Prior Function            PT Goals (current goals can now be found in the care plan section) Acute Rehab PT Goals Patient Stated Goal: not stated PT Goal Formulation: With patient/family Time For Goal Achievement: 06/19/22 Potential to Achieve Goals:  Good Progress towards PT goals: Progressing toward goals    Frequency    Min 3X/week      PT Plan Current plan remains appropriate    Co-evaluation              AM-PAC PT "6 Clicks" Mobility   Outcome Measure  Help needed turning from your back to your side while in a flat bed without using bedrails?: None Help needed moving from lying on your back to sitting on the  side of a flat bed without using bedrails?: None Help needed moving to and from a bed to a chair (including a wheelchair)?: A Little Help needed standing up from a chair using your arms (e.g., wheelchair or bedside chair)?: A Little Help needed to walk in hospital room?: A Little Help needed climbing 3-5 steps with a railing? : A Little 6 Click Score: 20    End of Session Equipment Utilized During Treatment: Gait belt Activity Tolerance: Patient tolerated treatment well Patient left: in bed;with call bell/phone within reach;with bed alarm set Nurse Communication: Mobility status PT Visit Diagnosis: Other abnormalities of gait and mobility (R26.89);Muscle weakness (generalized) (M62.81)     Time: [id number] PT Time Calculation (min) (ACUTE ONLY): 9 min  Charges:  $Gait Training: 8-22 mins                      Tori Linette Gunderson PT, DPT 06/07/22, 10:34 AM

## 2022-06-07 NOTE — Progress Notes (Signed)
Calorie Count Note   48 hour calorie count ordered.   Diet: Regular Supplements: Ensure Plus High Protein   Day 2 (2/20): Breakfast: 150 kcals, 1g protein Lunch: sister brought hamburger, ate 100%(est ~250 kcals, 12g protein) Dinner: 5% -insubstantial amount Supplements: Ensure x3 =1050 kcals, 60g protein   Total intake: 1450 kcal (92% of minimum estimated needs)  73g protein (97% of minimum estimated needs)   Nutrition Dx: Severe Malnutrition related to chronic illness (COPD, dementia) as evidenced by severe fat depletion, severe muscle depletion.    Goal: Patient will meet greater than or equal to 90% of their needs    Intervention:  - Regular diet.  - Automatic house trays to ensure patient receives 3 meals a day.  - Ensure Plus High Protein po TID, each supplement provides 350 kcal and 20 grams of protein. - Encourage intake at all meals and of supplements.   Noted pt discharge order placed for today.  Clayton Bibles, MS, RD, LDN Inpatient Clinical Dietitian Contact information available via Amion

## 2022-06-08 LAB — CULTURE, BLOOD (ROUTINE X 2)
Culture: NO GROWTH
Culture: NO GROWTH
Special Requests: ADEQUATE
Special Requests: ADEQUATE

## 2022-06-08 NOTE — TOC Progression Note (Signed)
Transition of Care [facility]) - Progression Note    Patient Details  Name: Leon Foster MRN: [id number] Date of Birth: [birth year]  Transition of Care [facility]) CM/SW [place], RN Phone Number:[phone]  06/08/2022, 3:13 PM  Clinical Narrative:    CM received message  Rebecca/Alpha Paula Libra [phone] patient came back with no paper work. CM attempted to return call someone answers phone CM identified self and reason for call. CM was put on hold while two people were carrying on a conversation. CM waited with no response and has hung up.        Expected Discharge Plan and Services         Expected Discharge Date: 06/07/22                                     Social Determinants of Health (SDOH) Interventions SDOH Screenings   Food Insecurity: No Food Insecurity (06/03/2022)  Housing: Low Risk  (06/03/2022)  Transportation Needs: No Transportation Needs (06/03/2022)  Utilities: Not At Risk (06/03/2022)  Tobacco Use: High Risk (06/03/2022)    Readmission Risk Interventions     No data to display

## 2023-04-05 ENCOUNTER — Encounter: Payer: Self-pay | Admitting: Gastroenterology

## 2023-06-20 ENCOUNTER — Ambulatory Visit: Payer: Medicare Other | Admitting: Gastroenterology

## 2023-06-20 NOTE — Progress Notes (Deleted)
 HPI :     Colonoscopy Jan 2018 - Three 3 to 6 mm polyps in the ascending colon, removed with a cold snare. Resected and retrieved.  - Diverticulosis in the entire examined colon.  - The examination was otherwise normal on direct and retroflexion views.   Impression:  - Patient has a contact number available for emergencies. The signs and symptoms of potential delayed complications were discussed with the patient. Return to normal activities tomorrow. Written discharge instructions were provided to the patient.  - Resume previous diet.  - Continue present medications.  - Await pathology results. Given age and multiple co-morbid conditions you will likely not need further colon cancer screening, polyp surveillance testing (including stool testing).  Past Medical History:  Diagnosis Date   Acute renal failure (HCC) 11/2014   from recent hospitalization notes   Alcoholic (HCC)    Arthritis    osteoarthritis   Atrial fibrillation (HCC)    AVM (arteriovenous malformation) of colon    colonoscopy 2011 [facility]    Cardiomyopathy (HCC)    from recent hospitalization notes   Confusion    COPD (chronic obstructive pulmonary disease) (HCC)    Dementia (HCC)    Gangrene of digit    left toe   Hypertension    Leukocytosis    PAD (peripheral artery disease) (HCC)    Pulmonary hypertension (HCC)    Seizures (HCC)     1 time seizure on 12/28/14 - seen in ED   Tobacco dependence      Past Surgical History:  Procedure Laterality Date   AMPUTATION Left 01/01/2015   Procedure: Left Transmetatarsal Amputation;  Surgeon: Nadara Mustard, MD;  Location: [facility] OR;  Service: Orthopedics;  Laterality: Left;   LOWER EXTREMITY ANGIOGRAM Bilateral 12/14/2014   Procedure: Lower Extremity Angiogram;  Surgeon: Sherren Kerns, MD;  Location: [facility] INVASIVE CV LAB;  Service: Cardiovascular;  Laterality: Bilateral;   PERIPHERAL VASCULAR CATHETERIZATION N/A 12/14/2014   Procedure: Abdominal Aortogram;   Surgeon: Sherren Kerns, MD;  Location: [facility] INVASIVE CV LAB;  Service: Cardiovascular;  Laterality: N/A;   PERIPHERAL VASCULAR CATHETERIZATION Left 12/14/2014   Procedure: Peripheral Vascular Intervention;  Surgeon: Sherren Kerns, MD;  Location: [facility] INVASIVE CV LAB;  Service: Cardiovascular;  Laterality: Left;  SFA   Family History  Problem Relation Age of Onset   Stroke Sister    Deep vein thrombosis Sister    Cancer Brother    Diabetes Brother    Hyperlipidemia Brother    Hypertension Brother    Heart disease Father    Hypertension Sister    Social History   Tobacco Use   Smoking status: Some Days    Types: Cigarettes   Smokeless tobacco: Never  Substance Use Topics   Alcohol use: No    Alcohol/week: 0.0 standard drinks of alcohol    Comment: Pt has not drank while in SNF   Drug use: No   Current Outpatient Medications  Medication Sig Dispense Refill   acamprosate (CAMPRAL) 333 MG tablet Take 333 mg by mouth 3 (three) times daily with meals.     clopidogrel (PLAVIX) 75 MG tablet Take 1 tablet (75 mg total) by mouth daily with breakfast.     donepezil (ARICEPT) 10 MG tablet Take 10 mg by mouth at bedtime.     ferrous sulfate 325 (65 FE) MG EC tablet Take 1 tablet (325 mg total) by mouth 2 (two) times daily. 60 tablet 3   Multiple Vitamin (MULTIVITAMIN  WITH MINERALS) TABS tablet Take 1 tablet by mouth daily.     naltrexone (DEPADE) 50 MG tablet Take 50 mg by mouth 2 (two) times a day.     No current facility-administered medications for this visit.   No Known Allergies   Review of Systems: All systems reviewed and negative except where noted in HPI.    No results found.  Physical Exam: There were no vitals taken for this visit. Constitutional: Pleasant,well-developed, ***male in no acute distress. HEENT: Normocephalic and atraumatic. Conjunctivae are normal. No scleral icterus. Neck supple.  Cardiovascular: Normal rate, regular rhythm.  Pulmonary/chest: Effort  normal and breath sounds normal. No wheezing, rales or rhonchi. Abdominal: Soft, nondistended, nontender. Bowel sounds active throughout. There are no masses palpable. No hepatomegaly. Extremities: no edema Lymphadenopathy: No cervical adenopathy noted. Neurological: Alert and oriented to person place and time. Skin: Skin is warm and dry. No rashes noted. Psychiatric: Normal mood and affect. Behavior is normal.  CBC    Component Value Date/Time   WBC 7.0 06/07/2022 0850   RBC 3.15 (L) 06/07/2022 0850   HGB 7.0 (L) 06/07/2022 0850   HCT 23.3 (L) 06/07/2022 0850   PLT 207 06/07/2022 0850   MCV 74.0 (L) 06/07/2022 0850   MCV 96.1 11/26/2014 1311   MCH 22.2 (L) 06/07/2022 0850   MCHC 30.0 06/07/2022 0850   RDW 23.7 (H) 06/07/2022 0850   LYMPHSABS 1.3 06/03/2022 0813   MONOABS 0.3 06/03/2022 0813   EOSABS 0.1 06/03/2022 0813   BASOSABS 0.1 06/03/2022 0813    CMP     Component Value Date/Time   NA 141 06/07/2022 0850   NA 140 09/29/2016 0000   K 3.9 06/07/2022 0850   CL 117 (H) 06/07/2022 0850   CO2 19 (L) 06/07/2022 0850   GLUCOSE 91 06/07/2022 0850   BUN 11 06/07/2022 0850   BUN 13 09/29/2016 0000   CREATININE 1.02 06/07/2022 0850   CREATININE 5.27 (H) 11/26/2014 1308   CALCIUM 7.5 (L) 06/07/2022 0850   PROT 5.4 (L) 06/04/2022 0649   ALBUMIN 2.5 (L) 06/04/2022 0649   AST 12 (L) 06/04/2022 0649   ALT 6 06/04/2022 0649   ALKPHOS 38 06/04/2022 0649   BILITOT 0.6 06/04/2022 0649   GFRNONAA >60 06/07/2022 0850   GFRNONAA 10 (L) 11/26/2014 1308   GFRAA >60 11/14/2018 1124   GFRAA 12 (L) 11/26/2014 1308       Latest Ref Rng & Units 06/07/2022    8:50 AM 06/06/2022    8:27 PM 06/06/2022    4:35 AM  CBC EXTENDED  WBC 4.0 - 10.5 K/uL 7.0   6.5   RBC 4.22 - 5.81 MIL/uL 3.15   3.21   Hemoglobin 13.0 - 17.0 g/dL 7.0  7.6  7.1   HCT 40.9 - 52.0 % 23.3  25.0  23.5   Platelets 150 - 400 K/uL 207   229       ASSESSMENT AND PLAN:  No ref. provider found

## 2023-12-10 ENCOUNTER — Ambulatory Visit (INDEPENDENT_AMBULATORY_CARE_PROVIDER_SITE_OTHER): Admitting: Podiatry

## 2023-12-10 ENCOUNTER — Ambulatory Visit (INDEPENDENT_AMBULATORY_CARE_PROVIDER_SITE_OTHER)

## 2023-12-10 VITALS — Ht 67.0 in | Wt 98.0 lb

## 2023-12-10 DIAGNOSIS — M19071 Primary osteoarthritis, right ankle and foot: Secondary | ICD-10-CM | POA: Diagnosis not present

## 2023-12-10 DIAGNOSIS — Z89432 Acquired absence of left foot: Secondary | ICD-10-CM | POA: Diagnosis not present

## 2023-12-10 DIAGNOSIS — M79675 Pain in left toe(s): Secondary | ICD-10-CM

## 2023-12-10 DIAGNOSIS — M79674 Pain in right toe(s): Secondary | ICD-10-CM

## 2023-12-10 DIAGNOSIS — I739 Peripheral vascular disease, unspecified: Secondary | ICD-10-CM

## 2023-12-10 DIAGNOSIS — M7751 Other enthesopathy of right foot: Secondary | ICD-10-CM

## 2023-12-10 DIAGNOSIS — B351 Tinea unguium: Secondary | ICD-10-CM

## 2023-12-10 NOTE — Progress Notes (Signed)
  Subjective:  Patient ID: Leon Foster, male    DOB: 1940/06/04,  MRN: [id number]  Chief Complaint  Patient presents with   Foot Pain    RM 10 Patient is here for right foot pain. Patient states pain in the top and exterior side of the right foot. Pain has been present for the last 4 months.    Discussed the use of AI scribe software for clinical note transcription with the patient, who gave verbal consent to proceed.  History of Present Illness Leon Foster is an 83 year old male with peripheral artery disease who presents for a new amputation filler. He is accompanied by a family member who provides much of the history.  He has a history of peripheral artery disease affecting his left lower extremity, which necessitated a transmetatarsal amputation. He previously used an amputation filler to aid in walking, but now requires a new one as the current filler has been affecting his walking and balance. His family member notes that his balance has been off, prompting this follow-up.  Occasional pain from the patient is reported in the right foot, although his family member notes that his toenails are thick and elongated, causing some discomfort.  There is no history of diabetes, but he has had circulation issues in the past. His family member assists with history taking and decision making due to his history of dementia.      Objective:    Physical Exam VASCULAR: Weakly palpable DP and PT pulse bilaterally. Foot is warm   DERMATOLOGIC: No open lesions, rashes, or ulcerations. Shiny atrophic skin with trophic changes. NEUROLOGIC: Normal sensation to light touch and pressure. No paresthesias on examination. ORTHOPEDIC: Thick elongated mycotic nails on the right. Hammer toe deformity and hallux valgus on the right. Left foot, Well-healed transmetatarsal amputation. Smooth pain-free range of motion of all examined joints. No ecchymosis or bruising.   No pain to palpation.   No images  are attached to the encounter.    Results RADIOLOGY Foot X-ray: arthritis multiple mid foot joints, previously healed metatarsal fractures  PROCEDURE Nail debridement Description: Nails were debrided in length and thickness using a sharp nail nipper to a tolerable level.   Assessment:   1. Arthritis of right foot   2. Status post transmetatarsal amputation of left foot (HCC)   3. Pain due to onychomycosis of toenails of both feet   4. PAD (peripheral artery disease) (HCC)      Plan:  Patient was evaluated and treated and all questions answered.  Assessment and Plan Assessment & Plan Onychomycosis of right foot toenails with associated pain and nail deformity Thick and elongated mycotic nails on the right foot causing pain and discomfort. He is not a good candidate for oral or topical therapy. - Debrided nails to a tolerable level using a sharp nail nipper in thickness and length.  Transmetatarsal amputation of left foot with need for new shoe filler Previous amputation filler is inadequate, affecting walking and balance. - Write prescription for new shoe filler. - Provide contact information for [facility]. - Instructed family to call [facility] to schedule an appointment for fitting.  Peripheral arterial disease of left lower extremity, post-amputation      Return if symptoms worsen or fail to improve.

## 2023-12-10 NOTE — Patient Instructions (Addendum)
  VISIT SUMMARY: Today, you visited to address issues related to your left foot amputation and right foot toenail discomfort. Your family member was present to assist with providing your medical history and making decisions.  YOUR PLAN: -ONYCHOMYCOSIS OF RIGHT FOOT TOENAILS: Onychomycosis is a fungal infection of the toenails, causing them to become thick and elongated, which can lead to pain and discomfort. Since you are not a good candidate for oral or topical treatments, we trimmed your nails to a comfortable length using a nail nipper.  -TRANSMETATARSAL AMPUTATION OF LEFT FOOT WITH NEED FOR NEW SHOE FILLER: A transmetatarsal amputation involves removing part of the foot, and you need a new shoe filler to help with walking and balance. We wrote a prescription for a new shoe filler and provided contact information for [facility]. Your family should call [facility] to schedule an appointment for fitting.  -PERIPHERAL ARTERIAL DISEASE OF LEFT LOWER EXTREMITY, POST-AMPUTATION: Peripheral arterial disease is a condition where the blood vessels in your legs are narrowed, reducing blood flow. This condition led to your left foot amputation. We will continue to monitor your condition and ensure you have the necessary support for mobility.  INSTRUCTIONS: Please have your family contact [facility] to schedule an appointment for fitting your new shoe filler. Continue to monitor your right foot for any changes or discomfort, and follow up with us  if you experience any new symptoms or issues.                      Contains text generated by Abridge.                                 Contains text generated by Abridge.

## 2024-04-09 ENCOUNTER — Emergency Department (HOSPITAL_COMMUNITY)
Admission: EM | Admit: 2024-04-09 | Discharge: 2024-04-09 | Disposition: A | Discharge status: Home / Self Care | Attending: Emergency Medicine | Admitting: Emergency Medicine

## 2024-04-09 ENCOUNTER — Emergency Department (HOSPITAL_COMMUNITY)

## 2024-04-09 DIAGNOSIS — S42302A Unspecified fracture of shaft of humerus, left arm, initial encounter for closed fracture: Secondary | ICD-10-CM | POA: Insufficient documentation

## 2024-04-09 DIAGNOSIS — S4991XA Unspecified injury of right shoulder and upper arm, initial encounter: Secondary | ICD-10-CM | POA: Diagnosis present

## 2024-04-09 DIAGNOSIS — Y92002 Bathroom of unspecified non-institutional (private) residence single-family (private) house as the place of occurrence of the external cause: Secondary | ICD-10-CM | POA: Insufficient documentation

## 2024-04-09 DIAGNOSIS — Z7902 Long term (current) use of antithrombotics/antiplatelets: Secondary | ICD-10-CM | POA: Insufficient documentation

## 2024-04-09 DIAGNOSIS — W19XXXA Unspecified fall, initial encounter: Secondary | ICD-10-CM | POA: Insufficient documentation

## 2024-04-09 DIAGNOSIS — F0390 Unspecified dementia without behavioral disturbance: Secondary | ICD-10-CM | POA: Insufficient documentation

## 2024-04-09 DIAGNOSIS — S4992XA Unspecified injury of left shoulder and upper arm, initial encounter: Secondary | ICD-10-CM | POA: Diagnosis present

## 2024-04-09 DIAGNOSIS — W1830XA Fall on same level, unspecified, initial encounter: Secondary | ICD-10-CM | POA: Insufficient documentation

## 2024-04-09 DIAGNOSIS — S42201D Unspecified fracture of upper end of right humerus, subsequent encounter for fracture with routine healing: Secondary | ICD-10-CM | POA: Insufficient documentation

## 2024-04-09 LAB — BASIC METABOLIC PANEL WITH GFR
Anion gap: 10 (ref 5–15)
BUN: 28 mg/dL — ABNORMAL HIGH (ref 8–23)
CO2: 22 mmol/L (ref 22–32)
Calcium: 8.7 mg/dL — ABNORMAL LOW (ref 8.9–10.3)
Chloride: 108 mmol/L (ref 98–111)
Creatinine, Ser: 1.6 mg/dL — ABNORMAL HIGH (ref 0.61–1.24)
GFR, Estimated: 43 mL/min — ABNORMAL LOW
Glucose, Bld: 88 mg/dL (ref 70–99)
Potassium: 4.6 mmol/L (ref 3.5–5.1)
Sodium: 140 mmol/L (ref 135–145)

## 2024-04-09 LAB — CBC
HCT: 38.3 % — ABNORMAL LOW (ref 39.0–52.0)
Hemoglobin: 12.6 g/dL — ABNORMAL LOW (ref 13.0–17.0)
MCH: 31 pg (ref 26.0–34.0)
MCHC: 32.9 g/dL (ref 30.0–36.0)
MCV: 94.1 fL (ref 80.0–100.0)
Platelets: 247 K/uL (ref 150–400)
RBC: 4.07 MIL/uL — ABNORMAL LOW (ref 4.22–5.81)
RDW: 16.2 % — ABNORMAL HIGH (ref 11.5–15.5)
WBC: 10.8 K/uL — ABNORMAL HIGH (ref 4.0–10.5)
nRBC: 0 % (ref 0.0–0.2)

## 2024-04-09 MED ORDER — ACETAMINOPHEN 325 MG PO TABS
650.0000 mg | ORAL_TABLET | Freq: Once | ORAL | Status: AC
  Administered 2024-04-09: 650 mg via ORAL
  Filled 2024-04-09: qty 2

## 2024-04-09 NOTE — Discharge Instructions (Addendum)
 Hadley had a CT scan of his brain and cervical spine done in the ER which did not show any emergencies.  His x-ray of the left arm did not show a fracture or broken bone in his left humerus.  The orthopedic doctor recommends that he wears a sling at all time.  You will need to call to help to make an appointment with the orthopedic clinic in 1 to 2 weeks for follow-up.  He should not use his left arm for any lifting or activities.  I did update the patient's sister at bedside.

## 2024-04-09 NOTE — Progress Notes (Signed)
 Orthopedic Tech Progress Note Patient Details:  Leon Foster 1941-01-23 [id number]  Ortho Devices Type of Ortho Device: Arm sling Ortho Device/Splint Location: LUE Ortho Device/Splint Interventions: Ordered, Application, Adjustment  Level 2 trauma   Post Interventions Patient Tolerated: Poor, Fair Instructions Provided: Care of device  Leon Foster Pac 04/09/2024, 1:51 PM

## 2024-04-09 NOTE — Discharge Instructions (Signed)
 Carlin FORBES Sar  Thank you for allowing us  to take care of you today.  You came to the Emergency Department today because of fall this morning at nursing at [facility].  You had a broken upper arm.  This is something that usually heals on its own over time and older adults.  You did not have any bleeding in your head or broken bones in your neck.  You can use Tylenol  and ibuprofen every 4-6 hours as needed for pain control.  You came to the emergency department for more supportive sling.  We put you in a new sling, please follow-up outpatient with your primary care doctor and orthopedic surgery.  To-Do: 1. Please follow-up with your primary doctor within 1 - 2 weeks / as soon as possible.   Please return to the Emergency Department or call 911 if you experience have worsening of your symptoms, or do not get better, chest pain, shortness of breath, severe or significantly worsening pain, high fever, severe confusion, pass out or have any reason to think that you need emergency medical care.   We hope you feel better soon.   Department of Emergency Medicine [facility]

## 2024-04-09 NOTE — ED Provider Notes (Signed)
 " [name] [facility] Provider Note   CSN: [id number] Arrival date & time: 04/09/24  1158     Patient presents with: No chief complaint on file.   Leon Foster is a 83 y.o. male with a history of dementia presenting for eval for cognitive concern for a fall today and left arm injury.  Patient reportedly fell in the bathroom.  This was unwitnessed.  Patient cannot recall the events.  EMS reports he has a deformity to the left arm.  He arrives in a C-spine collar.  He is on Plavix  per his medication review.  {Add pertinent medical, surgical, social history, OB history to HPI:32947} HPI     Prior to Admission medications  Medication Sig Start Date End Date Taking? Authorizing Provider  acamprosate  (CAMPRAL ) 333 MG tablet Take 333 mg by mouth 3 (three) times daily with meals.    [provider]  clopidogrel  (PLAVIX ) 75 MG tablet Take 1 tablet (75 mg total) by mouth daily with breakfast. 12/15/14   Verdene Purchase, MD  donepezil  (ARICEPT ) 10 MG tablet Take 10 mg by mouth at bedtime.    [provider]  ferrous sulfate  325 (65 FE) MG EC tablet Take 1 tablet (325 mg total) by mouth 2 (two) times daily. 06/07/22 12/10/23  Rojelio Nest, DO  Multiple Vitamin (MULTIVITAMIN WITH MINERALS) TABS tablet Take 1 tablet by mouth daily. 12/15/14   Krishnan, Gokul, MD  naltrexone  (DEPADE) 50 MG tablet Take 50 mg by mouth 2 (two) times a day.    [provider]    Allergies: Patient has no known allergies.    Review of Systems  Updated Vital Signs BP 122/64   Pulse 74   Temp 98 F (36.7 C) (Oral)   Resp 18   SpO2 98%   Physical Exam Constitutional:      General: He is not in acute distress. HENT:     Head: Normocephalic and atraumatic.  Eyes:     Conjunctiva/sclera: Conjunctivae normal.     Pupils: Pupils are equal, round, and reactive to light.  Neck:     Comments: C spine collar in place Cardiovascular:     Rate and  Rhythm: Normal rate and regular rhythm.  Pulmonary:     Effort: Pulmonary effort is normal. No respiratory distress.  Abdominal:     General: There is no distension.     Tenderness: There is no abdominal tenderness.  Musculoskeletal:     Comments: Swelling, tenderness left mid-humerus  Skin:    General: Skin is warm and dry.  Neurological:     General: No focal deficit present.     Mental Status: He is alert. Mental status is at baseline.     (all labs ordered are listed, but only abnormal results are displayed) Labs Reviewed  BASIC METABOLIC PANEL WITH GFR  CBC    EKG: None  Radiology: No results found.  {Document cardiac monitor, telemetry assessment procedure when appropriate:32947} Procedures   Medications Ordered in the ED - No data to display    {Click here for ABCD2, HEART and other calculators REFRESH Note before signing:1}                              Medical Decision Making Amount and/or Complexity of Data Reviewed Labs: ordered. Radiology: ordered. ECG/medicine tests: ordered.   This patient presents to the ED with concern for fall. This involves an  extensive number of treatment options, and is a complaint that carries with it a high risk of complications and morbidity.  The differential diagnosis includes mechanical fall versus syncope versus arrhythmia versus other  Concern for potential injury of the left humerus on exam on arrival.  Co-morbidities that complicate the patient evaluation: Dementia  Additional history obtained from EMS  External records from outside source obtained and reviewed including ***  I ordered and personally interpreted labs.  The pertinent results include:  ***  I ordered imaging studies including CT imaging and x-ray imaging I independently visualized and interpreted imaging which showed *** I agree with the radiologist interpretation  The patient was maintained on a cardiac monitor.  I personally viewed and  interpreted the cardiac monitored which showed an underlying rhythm of: ***  Per my interpretation the patient's ECG shows ***  I ordered medication including ***  for ***  I have reviewed the patients home medicines and have made adjustments as needed  Test Considered: ***  I requested consultation with the ***,  and discussed lab and imaging findings as well as pertinent plan - they recommend: ***  After the interventions noted above, I reevaluated the patient and found that they have: {resolved/improved/worsened:23923::improved}  Social Determinants of Health:***  Dispostion:  After consideration of the diagnostic results and the patients response to treatment, I feel that the patent would benefit from ***.   {Document critical care time when appropriate  Document review of labs and clinical decision tools ie CHADS2VASC2, etc  Document your independent review of radiology images and any outside records  Document your discussion with family members, caretakers and with consultants  Document social determinants of health affecting pt's care  Document your decision making why or why not admission, treatments were needed:32947:::1}   Final diagnoses:  None    ED Discharge Orders     None        "

## 2024-04-09 NOTE — ED Triage Notes (Signed)
 Pt family reports only here for a sling ,the sling applied last night broke/ wasn't secure enough. They deny any other complaints

## 2024-04-09 NOTE — ED Provider Notes (Signed)
 " [place] [facility] DE[facility] AT [facility] Provider Note   CSN: [id number] Arrival date & time: 04/09/24  1540     History Chief Complaint  Leon Foster presents with   Arm Injury    HPI: Leon Foster is a 83 y.o. male with history pertinent for dementia who presents complaining of right upper arm pain. Leon Foster arrived via POV accompanied by family.  History provided by Leon Foster.  No interpreter required during this encounter.  Leon Foster reportedly had a fall this morning.  Went to [facility], and was diagnosed with a humerus fracture.  Reportedly was placed in a sling, however the sling only went around the distal arm and neck, and did not provide support around his waist, therefore family felt like Leon Foster's pain was poorly controlled at home, and wanted to come to the emergency department for a more supportive sling.  No additional falls, trauma.  Leon Foster's recorded medical, surgical, social, medication list and allergies were reviewed in the Snapshot window as part of the initial history.   Prior to Admission medications  Medication Sig Start Date End Date Taking? Authorizing Provider  acamprosate  (CAMPRAL ) 333 MG tablet Take 333 mg by mouth 3 (three) times daily with meals.    [provider]  clopidogrel  (PLAVIX ) 75 MG tablet Take 1 tablet (75 mg total) by mouth daily with breakfast. 12/15/14   Verdene Purchase, MD  donepezil  (ARICEPT ) 10 MG tablet Take 10 mg by mouth at bedtime.    [provider]  ferrous sulfate  325 (65 FE) MG EC tablet Take 1 tablet (325 mg total) by mouth 2 (two) times daily. 06/07/22 12/10/23  Rojelio Nest, DO  Multiple Vitamin (MULTIVITAMIN WITH MINERALS) TABS tablet Take 1 tablet by mouth daily. 12/15/14   Krishnan, Gokul, MD  naltrexone  (DEPADE) 50 MG tablet Take 50 mg by mouth 2 (two) times a day.    [provider]     Allergies: Leon Foster has no known allergies.   Review of Systems   ROS as per HPI  Physical  Exam Updated Vital Signs BP 125/77 (BP Location: Left Arm)   Pulse 80   Temp 97.8 F (36.6 C) (Oral)   Resp 14   SpO2 99%  Physical Exam Vitals and nursing note reviewed.  Constitutional:      General: Leon Foster is not in acute distress.    Appearance: Normal appearance.  HENT:     Head: Normocephalic and atraumatic.  Eyes:     Extraocular Movements: Extraocular movements intact.  Cardiovascular:     Rate and Rhythm: Normal rate.     Pulses: Normal pulses.  Pulmonary:     Effort: Pulmonary effort is normal.  Musculoskeletal:     Comments: Tenderness to palpation of left upper arm, hand neurovascularly intact, 2+ radial and ulnar pulses.  Skin:    General: Skin is warm and dry.     Capillary Refill: Capillary refill takes less than 2 seconds.  Neurological:     General: No focal deficit present.     Mental Status: Leon Foster is alert and oriented to person, place, and time.     ED Course/ Medical Decision Making/ A&P    Procedures Procedures   Medications Ordered in ED Medications - No data to display  Medical Decision Making:   Leon Foster is a 83 y.o. male who presents for discomfort related to sling as per above.  Physical exam is pertinent for palpation of left proximal humerus.   The differential includes  but is not limited to fracture, dislocation, tendon injury, neurovascular compromise.  Independent historian: Relative: Family at bedside  External data reviewed: Notes: Reviewed Leon Foster's notes and imaging from earlier today  Labs: Not indicated  Radiology: Not indicated CT Cervical Spine Wo Contrast Result Date: 04/09/2024 EXAM: CT CERVICAL SPINE WITHOUT CONTRAST 04/09/2024 12:39:00 PM TECHNIQUE: CT of the cervical spine was performed without the administration of intravenous contrast. Multiplanar reformatted images are provided for review. Automated exposure control, iterative reconstruction, and/or weight based adjustment of the mA/kV was utilized to reduce the  radiation dose to as low as reasonably achievable. COMPARISON: CT cervical spine 08/20/2014 CLINICAL HISTORY: Polytrauma, blunt. Fall on blood thinners. FINDINGS: BONES AND ALIGNMENT: Chronic straightening of the normal cervical lordosis and trace retrolisthesis of C3 on C4, C4 on C5, C5 on C6, and C6 on C7. No acute fracture or suspicious lesion. DEGENERATIVE CHANGES: Multilevel disc degeneration, most advanced at C3-C4, C5-C6, and C6-C7. At least mild spinal stenosis and severe right and moderate left neural foraminal stenosis at C3-C4. Suspected mild spinal stenosis from C4-C5 through C6-C7 as well. SOFT TISSUES: No prevertebral soft tissue swelling. Biapical pleural parenchymal lung scarring and emphysema. Periapical lucencies involving remaining mandibular teeth. IMPRESSION: 1. No acute cervical spine fracture or traumatic malalignment. Electronically signed by: Leon Hamburg MD 04/09/2024 01:03 PM EST RP Workstation: [id number]   DG Pelvis Portable Result Date: 04/09/2024 CLINICAL DATA:  Status post fall EXAM: PORTABLE PELVIS 1-2 VIEWS COMPARISON:  June 02, 2022 FINDINGS: Osteopenia. No acute fracture or dislocation. Joint spaces and alignment are relatively maintained. No area of erosion or osseous destruction. No unexpected radiopaque foreign body. Similar dense calcifications of the inferior pelvis consistent with known bladder stones. Sacrum is obscured by overlapping bowel contents. IMPRESSION: 1. No acute fracture or dislocation. 2. If there is a persistent clinical concern for nondisplaced hip or pelvic fracture, recommend dedicated pelvic CT or MRI. Electronically Signed   By: Leon Foster M.D.   On: 04/09/2024 12:55   DG Chest Portable 1 View Result Date: 04/09/2024 CLINICAL DATA:  Status post fall EXAM: PORTABLE CHEST 1 VIEW COMPARISON:  June 02, 2022 FINDINGS: The cardiomediastinal silhouette is unchanged in contour. No pleural effusion. No pneumothorax. No acute  pleuroparenchymal abnormality. LEFT-sided humeral fracture. Remote LEFT-sided rib fractures. Age-indeterminate LEFT ninth rib fracture better evaluated on contemporaneous humeral radiograph. Remote LEFT-sided rib fractures. IMPRESSION: 1. No acute cardiopulmonary abnormality. Electronically Signed   By: Leon Foster M.D.   On: 04/09/2024 12:53   CT Head Wo Contrast Result Date: 04/09/2024 EXAM: CT HEAD WITHOUT CONTRAST 04/09/2024 12:39:00 PM TECHNIQUE: CT of the head was performed without the administration of intravenous contrast. Automated exposure control, iterative reconstruction, and/or weight based adjustment of the mA/kV was utilized to reduce the radiation dose to as low as reasonably achievable. COMPARISON: Head CT and MRI 11/14/2018. CLINICAL HISTORY: Polytrauma, blunt. Fall on blood thinners. FINDINGS: BRAIN AND VENTRICLES: There is no evidence of an acute infarct, intracranial hemorrhage, mass, midline shift, hydrocephalus, or extra-axial fluid collection. There is mild global cerebral atrophy. Encephalomalacia anteriorly in the right greater than left temporal and frontal lobes is unchanged. Calcified atherosclerosis at the skull base. ORBITS: No acute abnormality. SINUSES: No acute abnormality. SOFT TISSUES AND SKULL: No acute soft tissue abnormality. No skull fracture. IMPRESSION: 1. No acute intracranial abnormality. 2. 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 Electronically signed by: Leon Hamburg MD 04/09/2024 12:52 PM EST RP Workstation: [id number]   DG Humerus  Left Result Date: 04/09/2024 CLINICAL DATA:  Fall with pain EXAM: LEFT HUMERUS - 2+ VIEW COMPARISON:  None Available. FINDINGS: There is a minimally displaced slightly foreshortened fracture of the mid humeral shaft. Is minimally comminuted. There is a age-indeterminate LEFT-sided rib fracture of the lateral ninth rib which may be subacute. Additional remote LEFT-sided rib  fracture. IMPRESSION: 1. Minimally displaced slightly foreshortened fracture of the mid humeral shaft. 2. Age-indeterminate LEFT-sided rib fracture of the lateral ninth rib which may be subacute. Recommend correlation with point tenderness. Electronically Signed   By: Leon Foster M.D.   On: 04/09/2024 12:51    EKG/Medicine tests: Not indicated EKG Interpretation:                  Interventions: Sling placement   See the EMR for full details regarding lab and imaging results.  Leon Foster presents to the emergency department for different sling.  Leon Foster had fall, had negative CT head, CT C-spine, reassuring chest x-ray and pelvic x-ray at [facility] this morning, and left humerus fracture on humerus x-ray.  Leon Foster is neurovascularly intact on exam.  Was placed in a sling with increased support, discharged with referral to orthopedic surgery for follow-up.  Presentation is most consistent with acute uncomplicated illness  Discussion of management or test interpretations with external provider(s): Not indicated  Risk Drugs:None  Disposition: DISCHARGE: I believe that the Leon Foster is safe for discharge home with outpatient follow-up. Leon Foster was informed of all pertinent physical exam, laboratory, and imaging findings. Leon Foster's suspected etiology of their symptom presentation was discussed with the Leon Foster and all questions were answered. We discussed following up with Ortho, PCP. I provided thorough ED return precautions. The Leon Foster feels safe and comfortable with this plan.  MDM generated using voice dictation software and may contain dictation errors.  Please contact me for any clarification or with any questions.  Clinical Impression:  1. Closed fracture of proximal end of right humerus with routine healing, unspecified fracture morphology, subsequent encounter      Discharge   Final Clinical Impression(s) / ED Diagnoses Final diagnoses:  Closed fracture of proximal end of  right humerus with routine healing, unspecified fracture morphology, subsequent encounter    Rx / DC Orders ED Discharge Orders          Ordered    Ambulatory referral to Orthopedic Surgery        04/09/24 1630             Rogelia Jerilynn RAMAN, MD 04/09/24 1747  "

## 2024-04-09 NOTE — ED Triage Notes (Signed)
 PT BIB [facility] from [facility] facility after unwitnessed FOT. PT uses wheelchair at baseline, claims hit head and L side, obvious deformity to L upper arm. Denies LOC. Aox3, dementia at baseline.
